# Patient Record
Sex: Female | Born: 1980
Health system: Southern US, Community
[De-identification: ages and names within clinical notes are randomized; demographics above are authoritative.]

## PROBLEM LIST (undated history)

## (undated) DIAGNOSIS — Q789 Osteochondrodysplasia, unspecified: Secondary | ICD-10-CM

## (undated) DIAGNOSIS — R87619 Unspecified abnormal cytological findings in specimens from cervix uteri: Secondary | ICD-10-CM

## (undated) DIAGNOSIS — N390 Urinary tract infection, site not specified: Secondary | ICD-10-CM

## (undated) DIAGNOSIS — N200 Calculus of kidney: Secondary | ICD-10-CM

## (undated) DIAGNOSIS — I341 Nonrheumatic mitral (valve) prolapse: Secondary | ICD-10-CM

## (undated) DIAGNOSIS — K219 Gastro-esophageal reflux disease without esophagitis: Secondary | ICD-10-CM

## (undated) DIAGNOSIS — F32A Depression, unspecified: Secondary | ICD-10-CM

## (undated) DIAGNOSIS — A4902 Methicillin resistant Staphylococcus aureus infection, unspecified site: Secondary | ICD-10-CM

## (undated) DIAGNOSIS — R011 Cardiac murmur, unspecified: Secondary | ICD-10-CM

## (undated) DIAGNOSIS — B029 Zoster without complications: Secondary | ICD-10-CM

## (undated) DIAGNOSIS — F329 Major depressive disorder, single episode, unspecified: Secondary | ICD-10-CM

## (undated) DIAGNOSIS — K589 Irritable bowel syndrome without diarrhea: Secondary | ICD-10-CM

## (undated) HISTORY — DX: Zoster without complications: B02.9

## (undated) HISTORY — DX: Urinary tract infection, site not specified: N39.0

## (undated) HISTORY — DX: Unspecified abnormal cytological findings in specimens from cervix uteri: R87.619

## (undated) HISTORY — DX: Osteochondrodysplasia, unspecified: Q78.9

## (undated) HISTORY — PX: COLPOSCOPY: SHX161

---
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
## 2002-12-05 ENCOUNTER — Ambulatory Visit (HOSPITAL_COMMUNITY): Admission: RE | Admit: 2002-12-05 | Discharge: 2002-12-05 | Payer: Self-pay | Admitting: Family Medicine

## 2002-12-05 ENCOUNTER — Encounter: Admission: RE | Admit: 2002-12-05 | Discharge: 2002-12-05 | Payer: Self-pay | Admitting: *Deleted

## 2004-11-28 ENCOUNTER — Other Ambulatory Visit: Admission: RE | Admit: 2004-11-28 | Discharge: 2004-11-28 | Payer: Self-pay | Admitting: Family Medicine

## 2005-10-15 ENCOUNTER — Inpatient Hospital Stay (HOSPITAL_COMMUNITY): Admission: AD | Admit: 2005-10-15 | Discharge: 2005-10-18 | Payer: Self-pay | Admitting: Obstetrics and Gynecology

## 2005-10-19 ENCOUNTER — Encounter: Admission: RE | Admit: 2005-10-19 | Discharge: 2005-11-16 | Payer: Self-pay | Admitting: Obstetrics and Gynecology

## 2005-11-29 ENCOUNTER — Emergency Department (HOSPITAL_COMMUNITY): Admission: EM | Admit: 2005-11-29 | Discharge: 2005-11-29 | Payer: Self-pay | Admitting: Emergency Medicine

## 2006-08-12 ENCOUNTER — Encounter: Admission: RE | Admit: 2006-08-12 | Discharge: 2006-08-12 | Payer: Self-pay | Admitting: Family Medicine

## 2008-07-22 ENCOUNTER — Emergency Department (HOSPITAL_COMMUNITY): Admission: EM | Admit: 2008-07-22 | Discharge: 2008-07-22 | Payer: Self-pay | Admitting: Family Medicine

## 2009-01-27 HISTORY — PX: TUBAL LIGATION: SHX77

## 2009-02-26 ENCOUNTER — Ambulatory Visit: Payer: Self-pay | Admitting: Advanced Practice Midwife

## 2009-02-26 ENCOUNTER — Inpatient Hospital Stay (HOSPITAL_COMMUNITY): Admission: AD | Admit: 2009-02-26 | Discharge: 2009-02-26 | Payer: Self-pay | Admitting: Optometry

## 2009-02-28 ENCOUNTER — Inpatient Hospital Stay (HOSPITAL_COMMUNITY): Admission: AD | Admit: 2009-02-28 | Discharge: 2009-02-28 | Payer: Self-pay | Admitting: Obstetrics and Gynecology

## 2009-06-17 ENCOUNTER — Inpatient Hospital Stay (HOSPITAL_COMMUNITY): Admission: AD | Admit: 2009-06-17 | Discharge: 2009-06-17 | Payer: Self-pay | Admitting: Obstetrics and Gynecology

## 2009-06-17 ENCOUNTER — Ambulatory Visit: Payer: Self-pay | Admitting: Physician Assistant

## 2009-06-21 ENCOUNTER — Inpatient Hospital Stay (HOSPITAL_COMMUNITY): Admission: AD | Admit: 2009-06-21 | Discharge: 2009-06-22 | Payer: Self-pay | Admitting: Obstetrics and Gynecology

## 2009-08-27 ENCOUNTER — Inpatient Hospital Stay (HOSPITAL_COMMUNITY): Admission: AD | Admit: 2009-08-27 | Discharge: 2009-08-27 | Payer: Self-pay | Admitting: Obstetrics and Gynecology

## 2009-09-06 ENCOUNTER — Inpatient Hospital Stay (HOSPITAL_COMMUNITY): Admission: AD | Admit: 2009-09-06 | Discharge: 2009-09-06 | Payer: Self-pay | Admitting: Obstetrics and Gynecology

## 2009-09-06 DIAGNOSIS — R109 Unspecified abdominal pain: Secondary | ICD-10-CM

## 2009-09-06 DIAGNOSIS — O9989 Other specified diseases and conditions complicating pregnancy, childbirth and the puerperium: Secondary | ICD-10-CM

## 2009-09-06 DIAGNOSIS — O99891 Other specified diseases and conditions complicating pregnancy: Secondary | ICD-10-CM

## 2009-09-06 DIAGNOSIS — N200 Calculus of kidney: Secondary | ICD-10-CM

## 2009-09-26 ENCOUNTER — Encounter (INDEPENDENT_AMBULATORY_CARE_PROVIDER_SITE_OTHER): Payer: Self-pay | Admitting: Obstetrics and Gynecology

## 2009-09-26 ENCOUNTER — Inpatient Hospital Stay (HOSPITAL_COMMUNITY): Admission: RE | Admit: 2009-09-26 | Discharge: 2009-09-28 | Payer: Self-pay | Admitting: Obstetrics and Gynecology

## 2010-03-08 ENCOUNTER — Inpatient Hospital Stay (INDEPENDENT_AMBULATORY_CARE_PROVIDER_SITE_OTHER)
Admission: RE | Admit: 2010-03-08 | Discharge: 2010-03-08 | Disposition: A | Discharge status: Home / Self Care | Payer: 59 | Source: Ambulatory Visit | Attending: Emergency Medicine | Admitting: Emergency Medicine

## 2010-03-08 DIAGNOSIS — L0201 Cutaneous abscess of face: Secondary | ICD-10-CM

## 2010-03-11 LAB — CULTURE, ROUTINE-ABSCESS: Gram Stain: NONE SEEN

## 2010-04-11 LAB — CBC
HCT: 30.3 % — ABNORMAL LOW (ref 36.0–46.0)
Hemoglobin: 10.3 g/dL — ABNORMAL LOW (ref 12.0–15.0)
WBC: 10.6 10*3/uL — ABNORMAL HIGH (ref 4.0–10.5)

## 2010-04-12 LAB — URINALYSIS, ROUTINE W REFLEX MICROSCOPIC
Glucose, UA: 250 mg/dL — AB
Ketones, ur: NEGATIVE mg/dL
Nitrite: NEGATIVE
Protein, ur: NEGATIVE mg/dL

## 2010-04-12 LAB — CBC
HCT: 33.6 % — ABNORMAL LOW (ref 36.0–46.0)
HCT: 33.8 % — ABNORMAL LOW (ref 36.0–46.0)
Hemoglobin: 11.4 g/dL — ABNORMAL LOW (ref 12.0–15.0)
MCH: 28.5 pg (ref 26.0–34.0)
MCHC: 34.4 g/dL (ref 30.0–36.0)
MCV: 84.1 fL (ref 78.0–100.0)
RBC: 4.02 MIL/uL (ref 3.87–5.11)
RDW: 13.4 % (ref 11.5–15.5)

## 2010-04-12 LAB — TYPE AND SCREEN
ABO/RH(D): O POS
Antibody Screen: NEGATIVE

## 2010-04-12 LAB — ABO/RH: ABO/RH(D): O POS

## 2010-04-14 LAB — URINALYSIS, ROUTINE W REFLEX MICROSCOPIC
Bilirubin Urine: NEGATIVE
Hgb urine dipstick: NEGATIVE
Protein, ur: NEGATIVE mg/dL
Urobilinogen, UA: 0.2 mg/dL (ref 0.0–1.0)

## 2010-04-15 LAB — COMPREHENSIVE METABOLIC PANEL
ALT: 14 U/L (ref 0–35)
Alkaline Phosphatase: 58 U/L (ref 39–117)
BUN: 11 mg/dL (ref 6–23)
Chloride: 105 mEq/L (ref 96–112)
Glucose, Bld: 102 mg/dL — ABNORMAL HIGH (ref 70–99)
Potassium: 3.8 mEq/L (ref 3.5–5.1)
Total Bilirubin: 0.4 mg/dL (ref 0.3–1.2)

## 2010-04-15 LAB — URINE MICROSCOPIC-ADD ON

## 2010-04-15 LAB — URINALYSIS, ROUTINE W REFLEX MICROSCOPIC
Bilirubin Urine: NEGATIVE
Glucose, UA: NEGATIVE mg/dL
Ketones, ur: 40 mg/dL — AB
Ketones, ur: NEGATIVE mg/dL
Protein, ur: NEGATIVE mg/dL
Protein, ur: NEGATIVE mg/dL
Urobilinogen, UA: 0.2 mg/dL (ref 0.0–1.0)

## 2010-04-15 LAB — URINE CULTURE
Colony Count: >=100000
Special Requests: POSITIVE

## 2010-04-15 LAB — CBC
HCT: 31.6 % — ABNORMAL LOW (ref 36.0–46.0)
Hemoglobin: 11.1 g/dL — ABNORMAL LOW (ref 12.0–15.0)
RBC: 3.62 MIL/uL — ABNORMAL LOW (ref 3.87–5.11)
WBC: 13.5 10*3/uL — ABNORMAL HIGH (ref 4.0–10.5)

## 2010-04-17 LAB — CBC
HCT: 40.6 % (ref 36.0–46.0)
Hemoglobin: 13.8 g/dL (ref 12.0–15.0)
MCV: 85 fL (ref 78.0–100.0)
Platelets: 264 10*3/uL (ref 150–400)
WBC: 12 10*3/uL — ABNORMAL HIGH (ref 4.0–10.5)

## 2010-04-17 LAB — URINALYSIS, ROUTINE W REFLEX MICROSCOPIC
Bilirubin Urine: NEGATIVE
Ketones, ur: 15 mg/dL — AB
Nitrite: NEGATIVE
Protein, ur: NEGATIVE mg/dL

## 2010-04-17 LAB — COMPREHENSIVE METABOLIC PANEL
Alkaline Phosphatase: 56 U/L (ref 39–117)
BUN: 6 mg/dL (ref 6–23)
CO2: 26 mEq/L (ref 19–32)
Chloride: 102 mEq/L (ref 96–112)
Creatinine, Ser: 0.54 mg/dL (ref 0.4–1.2)
GFR calc non Af Amer: >60 mL/min (ref >60)
Glucose, Bld: 86 mg/dL (ref 70–99)
Potassium: 3.7 mEq/L (ref 3.5–5.1)
Total Bilirubin: 0.4 mg/dL (ref 0.3–1.2)

## 2010-04-17 LAB — T4, FREE: Free T4: 1.59 ng/dL (ref 0.80–1.80)

## 2010-04-17 LAB — TSH: TSH: 0.828 u[IU]/mL (ref 0.350–4.500)

## 2010-05-06 LAB — POCT URINALYSIS DIP (DEVICE)
Ketones, ur: NEGATIVE mg/dL
Protein, ur: NEGATIVE mg/dL
Urobilinogen, UA: 0.2 mg/dL (ref 0.0–1.0)
pH: 7 (ref 5.0–8.0)

## 2010-05-06 LAB — POCT PREGNANCY, URINE: Preg Test, Ur: NEGATIVE

## 2010-06-14 NOTE — Op Note (Signed)
Emily [name], Emily [name]                    ACCOUNT NO.:  [id number]   MEDICAL RECORD NO.:  [id number]          PATIENT TYPE:  INP   LOCATION:  9129                          FACILITY:  [facility]   PHYSICIAN:  Miguel Aschoff, M.D.       DATE OF BIRTH:  [birth year]   DATE OF PROCEDURE:  10/15/2005  DATE OF DISCHARGE:                                 OPERATIVE REPORT   PREOPERATIVE DIAGNOSES:  1. Intrauterine pregnancy at term.  2. Nonreassuring fetal heart tracing.   POSTOPERATIVE DIAGNOSES:  1. Intrauterine pregnancy at term.  2. Nonreassuring fetal heart tracing.  3. Delivery of viable female infant, Apgar 9, 9, weighing 9 pounds 3 ounces.   PROCEDURE:  Primary low flap transverse cesarean section.   SURGEON:  Dr. Miguel Aschoff   ANESTHESIA:  Epidural.   COMPLICATIONS:  None.   JUSTIFICATION:  The patient is a 30 year old white female gravida 1, para 0  at term who presented with spontaneous onset of labor.  The patient  progressed to complete dilatation and remained at -2 station.  She pushed  for 1-1/2 hours and during these pushing efforts, she developed persistent  and repetitive variable decelerations.  With continued pushing,  decelerations became more marked consistent with tachycardia with no further  descent of fetal presenting part, it was elected to proceed with primary  cesarean section for nonreassuring fetal heart tracing.   PROCEDURE:  The patient was taken to the operating room, placed in the  supine position, deviated to left and prepped and draped in the usual  sterile fashion.  A Pfannenstiel incision was made, extended down through  subcutaneous tissue with bleeding points being clamped and coagulated as  they were encountered.  The fascia was then identified and incised  transversely and then separated from the underlying rectus muscles.  Rectus  muscles were divided in midline.  Peritoneum was then found and entered  carefully avoiding underlying structures.  Peritoneal  incision was then  extended under direct visualization.  Bladder flap was then created and  protected with the bladder blade.  An elliptical transverse incision was  made into lower uterine segment.  Amniotic cavity was entered.  Clear fluid  was obtained and, at this point, the patient was delivered of a viable female  infant, Apgar 9 at one minute and 9 at five minutes from a vertex ROA  position.  The baby weighed 9 pounds 3 ounces.  Baby was handed to the  pediatric team in attendance.  Cord bloods were then obtained for  appropriate studies.  The placenta was then delivered.  At this point, the  uterus was evacuated of any remaining products of conception.  The angles of  the uterine incision were then ligated using figure-of-eight sutures of #1  Vicryl.  Then the uterus was closed in layers; the first layer was running  interlocking suture of #1 Vicryl followed by an imbricating suture of #1  Vicryl.  The bladder flap was then reapproximated using running continuous 2-  0 Vicryl suture.  At this point, inspection made for hemostasis.  Hemostasis  appeared to be excellent, and the abdomen was closed.  The parietal  peritoneum was closed using running continuous 0 Vicryl suture.  Rectus  muscles were reapproximated using running continuous 0 Vicryl suture.  Fascia was then closed using 2 sutures of 0 Vicryl, each starting at the  lateral fascial angles and meeting in  the midline.  Subcutaneous tissue was closed using interrupted 0 Vicryl  suture, and the skin incision was closed using staples.  The estimated blood  loss was approximately 600 mL.  The patient tolerated the procedure well and  went to the recovery room in satisfactory condition.      Miguel Aschoff, M.D.  Electronically Signed     AR/MEDQ  D:  10/15/2005  T:  10/17/2005  Job:  [id number]

## 2010-06-14 NOTE — Discharge Summary (Signed)
Emily [name], Emily [name]                    ACCOUNT NO.:  [id number]   MEDICAL RECORD NO.:  [id number]          PATIENT TYPE:  INP   LOCATION:  9129                          FACILITY:  [facility]   PHYSICIAN:  Randye Lobo, M.D.   DATE OF BIRTH:  Feb 16, 1980   DATE OF ADMISSION:  10/15/2005  DATE OF DISCHARGE:  10/18/2005                                 DISCHARGE SUMMARY   FINAL DIAGNOSES:  1. Intrauterine pregnancy at term.  2. Active labor.  3. Nonreassuring fetal heart tracing.   PROCEDURE:  Primary low flap transverse cesarean section.   SURGEON:  Dr. Miguel Aschoff.   COMPLICATIONS:  None.   This 30 year old G1, P0 presents at term with spontaneous onset of labor.  The patient's antepartum course up to this point had been uncomplicated.  She had a negative group B strep culture obtained in our office at 35 weeks  and the patient is also noted to have mitral valve prolapse but no  complications from that.  She is admitted at this time.  She dilated to  complete and complete and was at -2 station.  She pushed for over an hour  and a half.  During the pushing effort, started to develop some persistent  and repetitive variable deceleration and the fetal presenting part was not  descending any further.  Because of this, a discussion was held with the  patient and a decision was made to proceed with a cesarean section.  The  patient was taken to the operating room on October 15, 2005 by Dr. Miguel Aschoff where a primary low flap transverse cesarean section was performed with  the delivery of a 9-pound 3-ounce female infant of  Apgars of 9 and 9.  Delivery went without complications.  The patient's postoperative course was  complicated by some postoperative anemia.  The patient remained afebrile.  She was started on some iron during her postoperative course and was felt  ready for discharge on postoperative day three.  She was sent home on a  regular diet.  Told to decrease activities.  Told to  continue her prenatal  vitamins and an iron supplement 3 times daily.  Was told she could use some  Percocet 1-2 every 4 hours as needed for pain but also she could use  ibuprofen up to 600 mg every 6 hours as needed for pain and was to follow up  in our office in 4 weeks.  Instructions and precautions were reviewed with  the patient.   LABS ON DISCHARGE:  The patient had a hemoglobin of 8.0 which was down from  a preoperative level of 11.1.  She also had a white blood cell count of  12.0, and platelets of 182,000.      Leilani Able, P.A.-C.      Randye Lobo, M.D.  Electronically Signed    MB/MEDQ  D:  11/21/2005  T:  11/23/2005  Job:  [id number]

## 2011-03-24 ENCOUNTER — Encounter: Payer: Self-pay | Admitting: Gastroenterology

## 2011-03-25 ENCOUNTER — Encounter (HOSPITAL_COMMUNITY): Payer: Self-pay | Admitting: *Deleted

## 2011-03-25 ENCOUNTER — Ambulatory Visit (HOSPITAL_COMMUNITY)
Admission: RE | Admit: 2011-03-25 | Discharge: 2011-03-25 | Disposition: A | Discharge status: Home / Self Care | Payer: 59 | Source: Ambulatory Visit | Attending: Gastroenterology | Admitting: Gastroenterology

## 2011-03-25 ENCOUNTER — Encounter (HOSPITAL_COMMUNITY)
Admission: RE | Disposition: A | Discharge status: Home / Self Care | Payer: Self-pay | Source: Ambulatory Visit | Attending: Gastroenterology

## 2011-03-25 DIAGNOSIS — R1310 Dysphagia, unspecified: Secondary | ICD-10-CM | POA: Insufficient documentation

## 2011-03-25 HISTORY — DX: Depression, unspecified: F32.A

## 2011-03-25 HISTORY — DX: Cardiac murmur, unspecified: R01.1

## 2011-03-25 HISTORY — PX: SAVORY DILATION: SHX5439

## 2011-03-25 HISTORY — PX: ESOPHAGOGASTRODUODENOSCOPY: SHX5428

## 2011-03-25 HISTORY — DX: Nonrheumatic mitral (valve) prolapse: I34.1

## 2011-03-25 HISTORY — DX: Calculus of kidney: N20.0

## 2011-03-25 HISTORY — DX: Gastro-esophageal reflux disease without esophagitis: K21.9

## 2011-03-25 HISTORY — DX: Methicillin resistant Staphylococcus aureus infection, unspecified site: A49.02

## 2011-03-25 HISTORY — DX: Major depressive disorder, single episode, unspecified: F32.9

## 2011-03-25 HISTORY — DX: Irritable bowel syndrome, unspecified: K58.9

## 2011-03-25 HISTORY — DX: Urinary tract infection, site not specified: N39.0

## 2011-03-25 SURGERY — EGD (ESOPHAGOGASTRODUODENOSCOPY)
Anesthesia: Moderate Sedation

## 2011-03-25 MED ORDER — FENTANYL CITRATE 0.05 MG/ML IJ SOLN
INTRAMUSCULAR | Status: AC
  Filled 2011-03-25: qty 4

## 2011-03-25 MED ORDER — DIPHENHYDRAMINE HCL 50 MG/ML IJ SOLN
INTRAMUSCULAR | Status: AC
  Filled 2011-03-25: qty 1

## 2011-03-25 MED ORDER — MIDAZOLAM HCL 10 MG/2ML IJ SOLN
INTRAMUSCULAR | Status: AC
  Filled 2011-03-25: qty 4

## 2011-03-25 MED ORDER — FENTANYL NICU IV SYRINGE 50 MCG/ML
INJECTION | INTRAMUSCULAR | Status: DC | PRN
  Administered 2011-03-25 (×3): 25 ug via INTRAVENOUS

## 2011-03-25 MED ORDER — BUTAMBEN-TETRACAINE-BENZOCAINE 2-2-14 % EX AERO
INHALATION_SPRAY | CUTANEOUS | Status: DC | PRN
  Administered 2011-03-25: 2 via TOPICAL

## 2011-03-25 MED ORDER — MIDAZOLAM HCL 10 MG/2ML IJ SOLN
INTRAMUSCULAR | Status: DC | PRN
  Administered 2011-03-25: 2 mg via INTRAVENOUS
  Administered 2011-03-25 (×2): 2.5 mg via INTRAVENOUS

## 2011-03-25 MED ORDER — SODIUM CHLORIDE 0.9 % IV SOLN
Freq: Once | INTRAVENOUS | Status: AC
  Administered 2011-03-25: 10:00:00 via INTRAVENOUS

## 2011-03-25 NOTE — Op Note (Signed)
[facility] [address] [place], [place]  [address]  ENDOSCOPY PROCEDURE REPORT  PATIENT:  Emily [name], Emily [name]  MR#:  [id number] BIRTHDATE:  Jul 19, 1980, 30 yrs. old  GENDER:  female  ENDOSCOPIST:  Dorena Cookey Referred by:  PROCEDURE DATE:  03/25/2011 PROCEDURE: ASA CLASS: INDICATIONS:  MEDICATIONS: 75 mcg fentanyl  7 mg Versed TOPICAL ANESTHETIC: Cetacaine spray  DESCRIPTION OF PROCEDURE:   After the risks benefits and alternatives of the procedure were thoroughly explained, informed consent was obtained.  The Pentax Gastroscope [id number] endoscope was introduced through the mouth and advanced to the , without limitations.  The instrument was slowly withdrawn as the mucosa was fully examined. <<PROCEDUREIMAGES>>  FINDINGS:  Basically normal endoscopy, no ring stricture or other abnormality. No specific appearance suggestive of eosinophilic esophagitis but biopsies were obtained to rule this out.  COMPLICATIONS:  None.  ENDOSCOPIC IMPRESSION:  Normal endoscopy without endoscopically visible causes for dysphagia.  RECOMMENDATIONS: Await biopsy results. Further recommendations to follow  REPEAT EXAM:  ______________________________ Dorena Cookey  CC:  n. eSIGNED:   Dorena Cookey at 03/25/2011 10:25 AM  Lamont Dowdy, [phone]

## 2011-03-25 NOTE — Brief Op Note (Signed)
03/25/2011  10:27 AM  PATIENT:  Emily [name]  31 y.o. female  PRE-OPERATIVE DIAGNOSIS:  dys  POST-OPERATIVE DIAGNOSIS:  * No post-op diagnosis entered *  PROCEDURE:  Procedure(s) (LRB): ESOPHAGOGASTRODUODENOSCOPY (EGD) (N/A) SAVORY DILATION (N/A)  SURGEON:  Surgeon(s) and Role:    * Barrie Folk, MD - Primary  See formal operative note. No specific abnormalities found. Biopsies were taken to rule out eosinophilic esophagitis.

## 2011-03-26 ENCOUNTER — Encounter (HOSPITAL_COMMUNITY): Payer: Self-pay | Admitting: Gastroenterology

## 2011-04-01 ENCOUNTER — Ambulatory Visit: Payer: 59 | Admitting: Gastroenterology

## 2011-09-13 ENCOUNTER — Ambulatory Visit (INDEPENDENT_AMBULATORY_CARE_PROVIDER_SITE_OTHER): Payer: Managed Care, Other (non HMO) | Admitting: Internal Medicine

## 2011-09-13 ENCOUNTER — Ambulatory Visit: Payer: Managed Care, Other (non HMO)

## 2011-09-13 VITALS — BP 96/67 | HR 86 | Temp 98.0°F | Resp 16 | Ht 64.5 in | Wt 131.2 lb

## 2011-09-13 DIAGNOSIS — R51 Headache: Secondary | ICD-10-CM

## 2011-09-13 DIAGNOSIS — L03211 Cellulitis of face: Secondary | ICD-10-CM

## 2011-09-13 DIAGNOSIS — R519 Headache, unspecified: Secondary | ICD-10-CM

## 2011-09-13 DIAGNOSIS — L0201 Cutaneous abscess of face: Secondary | ICD-10-CM

## 2011-09-13 LAB — POCT CBC
Lymph, poc: 1.7 (ref 0.6–3.4)
MCHC: 30.3 g/dL — AB (ref 31.8–35.4)
MID (cbc): 0.4 (ref 0–0.9)
MPV: 8.7 fL (ref 0–99.8)
POC Granulocyte: 1.7 — AB (ref 2–6.9)
POC LYMPH PERCENT: 44.4 %L (ref 10–50)
POC MID %: 11.1 %M (ref 0–12)
RDW, POC: 13.3 %

## 2011-09-13 MED ORDER — CEFTRIAXONE SODIUM 1 G IJ SOLR
1.0000 g | Freq: Once | INTRAMUSCULAR | Status: AC
  Administered 2011-09-13: 1 g via INTRAMUSCULAR

## 2011-09-13 NOTE — Progress Notes (Signed)
  Subjective:    Patient ID: Emily [name], female    DOB: 07-Apr-1980, 31 y.o.   MRN: [id number]  HPI Hx of 4 episodes of MRSA infection. Now has abscess on left cheek and getting bigger after 4 days of doxycycline. Painfull and swollen. Takes keflex without allergic reaction, will give rocephin im  Review of Systems Goes to other urgent cares    Objective:   Physical Exam  Vitals reviewed. Constitutional: She is oriented to person, place, and time. She appears well-developed and well-nourished. She appears distressed.  HENT:  Head: Head is with left periorbital erythema.    Right Ear: External ear normal.  Left Ear: External ear normal.  Nose: Nose normal.  Mouth/Throat: Oropharynx is clear and moist.       Abscess face Very painfull  Eyes: EOM are normal.  Neck: Normal range of motion. Neck supple.  Pulmonary/Chest: Effort normal.  Genitourinary: Vagina normal.  Neurological: She is alert and oriented to person, place, and time. Coordination normal.  Skin: Skin is warm. Rash noted. There is erythema.  Psychiatric: She has a normal mood and affect.   cbc Results for orders placed in visit on 09/13/11  POCT CBC      Component Value Range   WBC 3.9 (*) 4.6 - 10.2 K/uL   Lymph, poc 1.7  0.6 - 3.4   POC LYMPH PERCENT 44.4  10 - 50 %L   MID (cbc) 0.4  0 - 0.9   POC MID % 11.1  0 - 12 %M   POC Granulocyte 1.7 (*) 2 - 6.9   Granulocyte percent 44.5  37 - 80 %G   RBC 4.74  4.04 - 5.48 M/uL   Hemoglobin 12.5  12.2 - 16.2 g/dL   HCT, POC 40.9  81.1 - 47.9 %   MCV 86.9  80 - 97 fL   MCH, POC 26.4 (*) 27 - 31.2 pg   MCHC 30.3 (*) 31.8 - 35.4 g/dL   RDW, POC 91.4     Platelet Count, POC 237  142 - 424 K/uL   MPV 8.7  0 - 99.8 fL   [facility] reading (PRIMARY) by  Dr.Guest nad.       Assessment & Plan:  Abscess/cellulitis face probably improving Rocephin 1g im Consider clindamycin

## 2011-09-13 NOTE — Patient Instructions (Signed)

## 2012-02-15 ENCOUNTER — Telehealth: Payer: Self-pay

## 2012-02-15 ENCOUNTER — Ambulatory Visit (INDEPENDENT_AMBULATORY_CARE_PROVIDER_SITE_OTHER): Payer: Managed Care, Other (non HMO) | Admitting: Emergency Medicine

## 2012-02-15 VITALS — BP 103/68 | HR 90 | Temp 98.0°F | Resp 16 | Ht 65.0 in | Wt 129.0 lb

## 2012-02-15 DIAGNOSIS — L299 Pruritus, unspecified: Secondary | ICD-10-CM

## 2012-02-15 DIAGNOSIS — T7840XA Allergy, unspecified, initial encounter: Secondary | ICD-10-CM

## 2012-02-15 DIAGNOSIS — N390 Urinary tract infection, site not specified: Secondary | ICD-10-CM

## 2012-02-15 DIAGNOSIS — R21 Rash and other nonspecific skin eruption: Secondary | ICD-10-CM

## 2012-02-15 LAB — POCT URINALYSIS DIPSTICK
Leukocytes, UA: NEGATIVE
Nitrite, UA: NEGATIVE
Protein, UA: NEGATIVE
pH, UA: 5.5

## 2012-02-15 LAB — POCT UA - MICROSCOPIC ONLY
Casts, Ur, LPF, POC: NEGATIVE
Crystals, Ur, HPF, POC: NEGATIVE
Yeast, UA: NEGATIVE

## 2012-02-15 MED ORDER — EPINEPHRINE 0.3 MG/0.3ML IJ DEVI: 0.3000 mg | Freq: Once | INTRAMUSCULAR | Status: DC

## 2012-02-15 NOTE — Patient Instructions (Addendum)
You can take Zyrtec 10 mg one a day and have already had one dose today. You can take Zantac 150 twice a day and you have had one dose today. You can take Benadryl 25 mg one to 2 every 6 hours as needed. You can also use Aveeno bath. You will have an EpiPen for emergency situations.Allergic Reaction, Mild to Moderate Allergies may happen from anything your body is sensitive to. This may be food, medications, pollens, chemicals, and nearly anything around you in everyday life that produces allergens. An allergen is anything that causes an allergy producing substance. Allergens cause your body to release allergic antibodies. Through a chain of events, they cause a release of histamine into the blood stream. Histamines are meant to protect you, but they also cause your discomfort. This is why antihistamines are often used for allergies. Heredity is often a factor in causing allergic reactions. This means you may have some of the same allergies as your parents. Allergies happen in all age groups. You may have some idea of what caused your reaction. There are many allergens around [place]. It may be difficult to know what caused your reaction. If this is a first time event, it may never happen again. Allergies cannot be cured but can be controlled with medications. SYMPTOMS  You may get some or all of the following problems from allergies.  Swelling and itching in and around the mouth.   Tearing, itchy eyes.   Nasal congestion and runny nose.   Sneezing and coughing.   An itchy red rash or hives.   Vomiting or diarrhea.   Difficulty breathing.  Seasonal allergies occur in all age groups. They are seasonal because they usually occur during the same season every year. They may be a reaction to molds, grass pollens, or tree pollens. Other causes of allergies are house dust mite allergens, pet dander and mold spores. These are just a common few of the thousands of allergens around [place]. All of the symptoms listed  above happen when you come in contact with pollens and other allergens. Seasonal allergies are usually not life threatening. They are generally more of a nuisance that can often be handled using medications. Hay fever is a combination of all or some of the above listed allergy problems. It may often be treated with simple over-the-counter medications such as diphenhydramine. Take medication as directed. Check with your caregiver or package insert for child dosages. TREATMENT AND HOME CARE INSTRUCTIONS If hives or rash are present:  Take medications as directed.   You may use an over-the-counter antihistamine (diphenhydramine) for hives and itching as needed. Do not drive or drink alcohol until medications used to treat the reaction have worn off. Antihistamines tend to make people sleepy.   Apply cold cloths (compresses) to the skin or take baths in cool water. This will help itching. Avoid hot baths or showers. Heat will make a rash and itching worse.   If your allergies persist and become more severe, and over the counter medications are not effective, there are many new medications your caretaker can prescribe. Immunotherapy or desensitizing injections can be used if all else fails. Follow up with your caregiver if problems continue.  SEEK MEDICAL CARE IF:   Your allergies are becoming progressively more troublesome.   You suspect a food allergy. Symptoms generally happen within 30 minutes of eating a food.   Your symptoms have not gone away within 2 days or are getting worse.   You develop new  symptoms.   You want to retest yourself or your child with a food or drink you think causes an allergic reaction. Never test yourself or your child of a suspected allergy without being under the watchful eye of your caregivers. A second exposure to an allergen may be life-threatening.  SEEK IMMEDIATE MEDICAL CARE IF:  You develop difficulty breathing or wheezing, or have a tight feeling in your  chest or throat.   You develop a swollen mouth, hives, swelling, or itching all over your body.  A severe reaction with any of the above problems should be considered life-threatening. If you suddenly develop difficulty breathing call for local emergency medical help. THIS IS AN EMERGENCY. MAKE SURE YOU:   Understand these instructions.   Will watch your condition.   Will get help right away if you are not doing well or get worse.  Document Released: 11/10/2006 Document Revised: 01/02/2011 Document Reviewed: 11/10/2006 [facility] Patient Information 2012 [place], [place].

## 2012-02-15 NOTE — Progress Notes (Signed)
  Subjective:    Patient ID: Emily [name], female    DOB: Jul 01, 1980, 32 y.o.   MRN: [id number]  HPI Patient started taking an antibiotic for urinary symptoms and began itching on Saturday. She then began to have a rash. Denies throat pain/swelling or having a difficult time breathing. Patient is allergic to penicillin sulfa. She recently was treated with Macrodantin. This did not heal her urinary tract and she was changed to Cipro. He'll does Friday 2 doses Saturday no dose this AM.    Review of Systems     Objective:   Physical Exam H. EENT exam is unremarkable. Examination of the throat reveals no swelling of the uvula. There is no stridor noted her lungs are clear. There are hives present on the arms inside of both legs and on the back  Results for orders placed in visit on 02/15/12  POCT URINALYSIS DIPSTICK      Component Value Range   Color, UA yellow     Clarity, UA cloudy     Glucose, UA neg     Bilirubin, UA neg     Ketones, UA trace     Spec Grav, UA >=1.030     Blood, UA mod     pH, UA 5.5     Protein, UA neg     Urobilinogen, UA 0.2     Nitrite, UA neg     Leukocytes, UA Negative    POCT UA - MICROSCOPIC ONLY      Component Value Range   WBC, Ur, HPF, POC 5-10     RBC, urine, microscopic 3-6     Bacteria, U Microscopic small     Mucus, UA small     Epithelial cells, urine per micros 5-10     Crystals, Ur, HPF, POC neg     Casts, Ur, LPF, POC neg     Yeast, UA neg          Assessment & Plan:  Patient have an acute allergic reaction most likely secondary to Cipro. We'll need to stop the Cipro she will be treated with antihistamines Aveeno bath. I will also give her an EpiPen for emergency situations. Her urine is mildly abnormal but will hold off on treatment at this for now. Her urine culture was done. She is to contact her doctor tomorrow .

## 2012-02-15 NOTE — Telephone Encounter (Signed)
WAS HERE EARLIER TODAY FOR ALLERGIC REACTION; HAD 3 RX GOING TO [facility] BUT THEY HAVE NOT BEEN RECEIVED YET; WANTS TO KNOW IF THEY CAN BE SENT TO [facility]

## 2012-02-16 ENCOUNTER — Telehealth: Payer: Self-pay

## 2012-02-16 MED ORDER — RANITIDINE HCL 150 MG PO TABS: 150.0000 mg | ORAL_TABLET | Freq: Two times a day (BID) | ORAL | Status: DC

## 2012-02-16 MED ORDER — CETIRIZINE HCL 10 MG PO TABS: 10.0000 mg | ORAL_TABLET | Freq: Every day | ORAL | Status: DC

## 2012-02-16 NOTE — Telephone Encounter (Signed)
[facility] - fax ov [phone]   Needs the notes from saturdays OV   [phone] (H)

## 2012-02-16 NOTE — Telephone Encounter (Signed)
She was to have Zantac and Zyrtec these are sent for her.

## 2012-02-16 NOTE — Telephone Encounter (Signed)
faxed

## 2012-04-21 ENCOUNTER — Telehealth: Payer: Self-pay | Admitting: Gynecology

## 2012-04-21 ENCOUNTER — Encounter: Payer: Self-pay | Admitting: Obstetrics & Gynecology

## 2012-04-21 NOTE — Telephone Encounter (Signed)
PT. DID NOT WISH TO LEAVE A MSG

## 2012-04-23 ENCOUNTER — Encounter: Payer: Managed Care, Other (non HMO) | Admitting: Gynecology

## 2012-06-20 ENCOUNTER — Ambulatory Visit (INDEPENDENT_AMBULATORY_CARE_PROVIDER_SITE_OTHER): Payer: Managed Care, Other (non HMO) | Admitting: Physician Assistant

## 2012-06-20 VITALS — BP 114/78 | HR 74 | Temp 98.0°F | Resp 16 | Ht 64.0 in | Wt 129.6 lb

## 2012-06-20 DIAGNOSIS — R82998 Other abnormal findings in urine: Secondary | ICD-10-CM

## 2012-06-20 DIAGNOSIS — A599 Trichomoniasis, unspecified: Secondary | ICD-10-CM

## 2012-06-20 DIAGNOSIS — N390 Urinary tract infection, site not specified: Secondary | ICD-10-CM

## 2012-06-20 DIAGNOSIS — N898 Other specified noninflammatory disorders of vagina: Secondary | ICD-10-CM

## 2012-06-20 DIAGNOSIS — Z7251 High risk heterosexual behavior: Secondary | ICD-10-CM

## 2012-06-20 DIAGNOSIS — R8290 Unspecified abnormal findings in urine: Secondary | ICD-10-CM

## 2012-06-20 LAB — POCT UA - MICROSCOPIC ONLY: Yeast, UA: NEGATIVE

## 2012-06-20 LAB — POCT URINALYSIS DIPSTICK
Bilirubin, UA: NEGATIVE
Glucose, UA: NEGATIVE
Nitrite, UA: NEGATIVE
Urobilinogen, UA: 0.2

## 2012-06-20 LAB — POCT WET PREP WITH KOH
KOH Prep POC: NEGATIVE
Trichomonas, UA: POSITIVE

## 2012-06-20 LAB — HEPATITIS C ANTIBODY: HCV Ab: NEGATIVE

## 2012-06-20 MED ORDER — METRONIDAZOLE 500 MG PO TABS: 2000.0000 mg | ORAL_TABLET | Freq: Once | ORAL | Status: DC

## 2012-06-20 MED ORDER — METRONIDAZOLE 500 MG PO TABS: 2000.0000 mg | ORAL_TABLET | Freq: Once | ORAL | Status: AC

## 2012-06-20 NOTE — Patient Instructions (Addendum)
Trichomoniasis °Trichomoniasis is an infection, caused by the Trichomonas organism, that affects both women and men. In women, the outer female genitalia and the vagina are affected. In men, the penis is mainly affected, but the prostate and other reproductive organs can also be involved. Trichomoniasis is a sexually transmitted disease (STD) and is most often passed to another person through sexual contact. The majority of people who get trichomoniasis do so from a sexual encounter and are also at risk for other STDs. °CAUSES  °· Sexual intercourse with an infected partner. °· It can be present in swimming pools or hot tubs. °SYMPTOMS  °· Abnormal gray-green frothy vaginal discharge in women. °· Vaginal itching and irritation in women. °· Itching and irritation of the area outside the vagina in women. °· Penile discharge with or without pain in males. °· Inflammation of the urethra (urethritis), causing painful urination. °· Bleeding after sexual intercourse. °RELATED COMPLICATIONS °· Pelvic inflammatory disease. °· Infection of the uterus (endometritis). °· Infertility. °· Tubal (ectopic) pregnancy. °· It can be associated with other STDs, including gonorrhea and chlamydia, hepatitis B, and HIV. °COMPLICATIONS DURING PREGNANCY °· Early (premature) delivery. °· Premature rupture of the membranes (PROM). °· Low birth weight. °DIAGNOSIS  °· Visualization of Trichomonas under the microscope from the vagina discharge. °· Ph of the vagina greater than 4.5, tested with a test tape. °· Trich Rapid Test. °· Culture of the organism, but this is not usually needed. °· It may be found on a Pap test. °· Having a "strawberry cervix,"which means the cervix looks very red like a strawberry. °TREATMENT  °· You may be given medication to fight the infection. Inform your caregiver if you could be or are pregnant. Some medications used to treat the infection should not be taken during pregnancy. °· Over-the-counter medications or  creams to decrease itching or irritation may be recommended. °· Your sexual partner will need to be treated if infected. °HOME CARE INSTRUCTIONS  °· Take all medication prescribed by your caregiver. °· Take over-the-counter medication for itching or irritation as directed by your caregiver. °· Do not have sexual intercourse while you have the infection. °· Do not douche or wear tampons. °· Discuss your infection with your partner, as your partner may have acquired the infection from you. Or, your partner may have been the person who transmitted the infection to you. °· Have your sex partner examined and treated if necessary. °· Practice safe, informed, and protected sex. °· See your caregiver for other STD testing. °SEEK MEDICAL CARE IF:  °· You still have symptoms after you finish the medication. °· You have an oral temperature above 102° F (38.9° C). °· You develop belly (abdominal) pain. °· You have pain when you urinate. °· You have bleeding after sexual intercourse. °· You develop a rash. °· The medication makes you sick or makes you throw up (vomit). °Document Released: 07/09/2000 Document Revised: 04/07/2011 Document Reviewed: 08/04/2008 °[employer]® Patient Information ©2014 [employer]. ° °

## 2012-06-20 NOTE — Progress Notes (Signed)
[address], [place] [address]   Phone [phone]  Subjective:    Patient ID: Emily [name], female    DOB: 05-19-1980, 32 y.o.   MRN: [id number]  HPI Pt presents to clinic with strong urine odor this am and 2 bumps on her R labia that she is worried about.  6 months ago she found out that her partner had cheated on her but she never got tested.  She is now with a new female partner and this noticed she had increase vaginal d/c.  She looked at her genitalia with a mirror and saw 2 bumps and she is really scared she has herpes.  She has an IUD and spots time to time when she wipes.  Her current partner has no symptoms.  She had a pap smear within a year >6 months ago which was normal.   Review of Systems  Constitutional: Negative for fever and chills.  Genitourinary: Positive for vaginal bleeding (from IUD and menses) and vaginal discharge (odor). Negative for dysuria, frequency and hematuria.       Objective:   Physical Exam  Vitals reviewed. Constitutional: She is oriented to person, place, and time. She appears well-developed and well-nourished.  HENT:  Head: Normocephalic and atraumatic.  Right Ear: External ear normal.  Left Ear: External ear normal.  Eyes: Conjunctivae are normal.  Neck: Normal range of motion.  Cardiovascular: Normal rate, regular rhythm and normal heart sounds.   No murmur heard. Pulmonary/Chest: Effort normal and breath sounds normal.  Abdominal: Soft. Bowel sounds are normal. There is no CVA tenderness.  Genitourinary: Pelvic exam was performed with patient supine. No labial fusion. There is rash (Inside of her labia minor patient has a hyperpigmented flat area, she also anterior to that has a blood blister that is raised.  There is no ulceration and no signs of HSV lesion) on the right labia. There is no tenderness, lesion or injury on the right labia. There is no rash, tenderness, lesion or injury on the left labia. No erythema, tenderness or bleeding  around the vagina. No foreign body around the vagina. No signs of injury around the vagina. Vaginal discharge (frothy d/c) found.  IUD string in place   Neurological: She is alert and oriented to person, place, and time.  Skin: Skin is warm and dry.  Psychiatric: She has a normal mood and affect. Her behavior is normal. Judgment and thought content normal.   Results for orders placed in visit on 06/20/12  POCT WET PREP WITH KOH      Result Value Range   Trichomonas, UA Positive     Clue Cells Wet Prep HPF POC 0-1     Epithelial Wet Prep HPF POC 0-4     Yeast Wet Prep HPF POC neg     Bacteria Wet Prep HPF POC 3+     RBC Wet Prep HPF POC tntc     WBC Wet Prep HPF POC 10-15     KOH Prep POC Negative    POCT URINALYSIS DIPSTICK      Result Value Range   Color, UA yellow     Clarity, UA clear     Glucose, UA neg     Bilirubin, UA neg     Ketones, UA neg     Spec Grav, UA 1.020     Blood, UA moderate     pH, UA 7.5     Protein, UA neg     Urobilinogen, UA 0.2  Nitrite, UA neg     Leukocytes, UA large (3+)    POCT UA - MICROSCOPIC ONLY      Result Value Range   WBC, Ur, HPF, POC 15-tntc     RBC, urine, microscopic 0-2     Bacteria, U Microscopic 1+     Mucus, UA moderate     Epithelial cells, urine per micros 10-tntc     Crystals, Ur, HPF, POC neg     Casts, Ur, LPF, POC neg     Yeast, UA neg            Assessment & Plan:  Vaginal discharge - Plan: POCT Wet Prep with KOH, GC/Chlamydia Probe Amp  High risk sexual behavior - due to past partner cheating on her- Plan: HSV 2 antibody, IgG, RPR, HIV antibody, Hepatitis C antibody, Hepatitis B surface antigen, Hepatitis B surface antibody  Abnormal urine odor - Plan: POCT urinalysis dipstick, POCT UA - Microscopic Only  UTI (urinary tract infection) - send for culture - this appears to be a dirty catch so we will wait for results before abx are started - Plan: Urine culture  Trichimoniasis -current partner needs  treatment- Plan: metroNIDAZOLE (FLAGYL) 500 MG tablet, DISCONTINUED: metroNIDAZOLE (FLAGYL) 500 MG tablet  Benny Lennert PA-C 06/20/2012 11:04 AM

## 2012-06-21 LAB — URINE CULTURE
Colony Count: NO GROWTH
Organism ID, Bacteria: NO GROWTH

## 2012-06-22 LAB — GC/CHLAMYDIA PROBE AMP
CT Probe RNA: NEGATIVE
GC Probe RNA: NEGATIVE

## 2012-06-22 LAB — HSV 2 ANTIBODY, IGG: HSV 2 Glycoprotein G Ab, IgG: 0.14 IV

## 2012-11-16 ENCOUNTER — Encounter: Payer: Self-pay | Admitting: Gynecology

## 2012-11-17 ENCOUNTER — Ambulatory Visit (INDEPENDENT_AMBULATORY_CARE_PROVIDER_SITE_OTHER): Payer: Managed Care, Other (non HMO) | Admitting: Gynecology

## 2012-11-17 ENCOUNTER — Encounter: Payer: Self-pay | Admitting: Gynecology

## 2012-11-17 VITALS — BP 102/60 | HR 68 | Resp 12 | Ht 64.5 in | Wt 140.0 lb

## 2012-11-17 DIAGNOSIS — B001 Herpesviral vesicular dermatitis: Secondary | ICD-10-CM

## 2012-11-17 DIAGNOSIS — Z0000 Encounter for general adult medical examination without abnormal findings: Secondary | ICD-10-CM

## 2012-11-17 DIAGNOSIS — Z01419 Encounter for gynecological examination (general) (routine) without abnormal findings: Secondary | ICD-10-CM

## 2012-11-17 DIAGNOSIS — Z124 Encounter for screening for malignant neoplasm of cervix: Secondary | ICD-10-CM

## 2012-11-17 DIAGNOSIS — B009 Herpesviral infection, unspecified: Secondary | ICD-10-CM

## 2012-11-17 LAB — POCT URINALYSIS DIPSTICK: Urobilinogen, UA: NEGATIVE

## 2012-11-17 MED ORDER — VALACYCLOVIR HCL 500 MG PO TABS: 500.0000 mg | ORAL_TABLET | Freq: Every day | ORAL | Status: DC

## 2012-11-17 NOTE — Progress Notes (Signed)
32 y.o. Divorced Caucasian female   G2P2002 here for annual exam. Pt is currently sexually active.  Pt is happy with bleeding pattern after IUD placed.  Pt had BTL at 2nd c/s, is currentl dating and unsure if she wants another child. She did have difficult pregnancies.  Pt has fever blisters at least once a month and has never been on suppression.  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          Sexually active: yes  The current method of family planning is tubal ligation and Mirena IUD.    Exercising: no  The patient does not participate in regular exercise at present. Last pap: 11/17/11 Negative  Alcohol: 1 occasionally  Tobacco: no BSE: no  Hgb: PCP ; Urine: Leuks 2    Health Maintenance  Topic Date Due  . Tetanus/tdap  09/26/2001  . Influenza Vaccine  08/27/2012  . Pap Smear  11/17/2014    Family History  Problem Relation Age of Onset  . Diabetes Mother     Type 2  . Hypertension Mother   . Cancer Maternal Grandmother   . Breast cancer Maternal Grandmother 50  . Cancer Maternal Grandfather   . Diabetes Maternal Grandfather     Type 1   . Lung cancer Maternal Grandfather   . Heart attack Paternal Grandfather     There are no active problems to display for this patient.   Past Medical History  Diagnosis Date  . Depression   . Mitral valve prolapse   . IBS (irritable bowel syndrome)   . MRSA (methicillin resistant Staphylococcus aureus)     infection on LT side of face, and forehead (02/2011)  . Nephrolithiasis   . UTI (urinary tract infection)   . Heart murmur   . GERD (gastroesophageal reflux disease)     Past Surgical History  Procedure Laterality Date  . Tubal ligation  2011  . Wisdom tooth extraction  2003  . Esophagogastroduodenoscopy  03/25/2011    Procedure: ESOPHAGOGASTRODUODENOSCOPY (EGD);  Surgeon: Barrie Folk, MD;  Location: [facility]  ENDOSCOPY;  Service: Endoscopy;  Laterality: N/A;  . Savory dilation  03/25/2011    Procedure: SAVORY DILATION;  Surgeon: Barrie Folk, MD;  Location[facility] ENDOSCOPY;  Service: Endoscopy;  Laterality: N/A;  . Cesarean section  2007 & 2011    x2    Allergies: Penicillins; Quinolones; and Sulfa antibiotics  Current Outpatient Prescriptions  Medication Sig Dispense Refill  . citalopram (CELEXA) 10 MG tablet Take 10 mg by mouth daily.      Marland Kitchen EPINEPHrine (EPI-PEN) 0.3 mg/0.3 mL DEVI Inject 0.3 mLs (0.3 mg total) into the muscle once.  1 Device  2  . levonorgestrel (MIRENA) 20 MCG/24HR IUD 1 each by Intrauterine route once.      . loratadine (CLARITIN) 10 MG tablet Take 10 mg by mouth daily.       No current facility-administered medications for this visit.    ROS: Pertinent items are noted in HPI.  Exam:    Ht 5' 4.5" (1.638 m)  Wt 140 lb (63.504 kg)  BMI 23.67 kg/m2  LMP 12/30/2011 Weight change: @WEIGHTCHANGE @ Last 3 height recordings:  Ht Readings from Last 3 Encounters:  11/17/12 5' 4.5" (1.638 m)  06/20/12 5\' 4"  (1.626 m)  02/15/12 5\' 5"  (1.651 m)   General appearance: alert, cooperative and appears stated age Head: Normocephalic, without  obvious abnormality, atraumatic Neck: no adenopathy, no carotid bruit, no JVD, supple, symmetrical, trachea midline and thyroid not enlarged, symmetric, no tenderness/mass/nodules Lungs: clear to auscultation bilaterally Breasts: normal appearance, no masses or tenderness Heart: regular rate and rhythm, S1, S2 normal, no murmur, click, rub or gallop Abdomen: soft, non-tender; bowel sounds normal; no masses,  no organomegaly Extremities: extremities normal, atraumatic, no cyanosis or edema Skin: Skin color, texture, turgor normal. No rashes or lesions Lymph nodes: Cervical, supraclavicular, and axillary nodes normal. no inguinal nodes palpated Neurologic: Grossly normal   Pelvic: External genitalia:  no lesions              Urethra: normal  appearing urethra with no masses, tenderness or lesions              Bartholins and Skenes: normal                 Vagina: normal appearing vagina with normal color and discharge, no lesions              Cervix: normal appearance and IUD string visualized              Pap taken: yes        Bimanual Exam:  Uterus:  uterus is normal size, shape, consistency and nontender                                      Adnexa:    normal adnexa in size, nontender and no masses                                      Rectovaginal: Confirms                                      Anus:  normal sphincter tone, no lesions  A: well woman Contemplating pregnancy Recurrent cold sores     P: pap smear with HRHPV Discussed tubal reversal, IVF Suggest due to frequency of cold sore-daily suppression-agrees counseled on breast self exam, adequate intake of calcium and vitamin D, diet and exercise return annually or prn   An After Visit Summary was printed and given to the patient.

## 2012-11-17 NOTE — Patient Instructions (Signed)

## 2012-11-19 LAB — IPS PAP TEST WITH HPV

## 2012-11-21 ENCOUNTER — Other Ambulatory Visit: Payer: Self-pay | Admitting: Gynecology

## 2012-11-21 DIAGNOSIS — IMO0002 Reserved for concepts with insufficient information to code with codable children: Secondary | ICD-10-CM

## 2012-11-23 ENCOUNTER — Telehealth: Payer: Self-pay | Admitting: Orthopedic Surgery

## 2012-11-23 NOTE — Telephone Encounter (Signed)
Message copied by Alfredo Batty on Tue Nov 23, 2012 12:16 PM ------      Message from: Douglass Rivers      Created: Sun Nov 21, 2012  8:47 AM       ASCUS, +HRPV, needs colpo, order dropped ------

## 2012-11-23 NOTE — Telephone Encounter (Signed)
Spoke with pt about Pap results and need for colposcopy. Procedures explained, questions answered. Advised pt her copay will be $50 and procedure covered 100% beyond that per Carolynn's call to ins co. Pt agreeable. Advised pt to take 800 mg of ibuprofen an hour before with food and fluids. Pt has IUD, therefore no menses. Sched appt 12-01-12 at 9:30 with TL.

## 2012-12-01 ENCOUNTER — Ambulatory Visit (INDEPENDENT_AMBULATORY_CARE_PROVIDER_SITE_OTHER): Payer: Managed Care, Other (non HMO) | Admitting: Gynecology

## 2012-12-01 ENCOUNTER — Encounter: Payer: Self-pay | Admitting: Gynecology

## 2012-12-01 ENCOUNTER — Ambulatory Visit: Payer: Managed Care, Other (non HMO) | Admitting: Gynecology

## 2012-12-01 VITALS — BP 126/72 | HR 64 | Resp 12 | Ht 64.5 in | Wt 141.0 lb

## 2012-12-01 DIAGNOSIS — R6889 Other general symptoms and signs: Secondary | ICD-10-CM

## 2012-12-01 DIAGNOSIS — IMO0002 Reserved for concepts with insufficient information to code with codable children: Secondary | ICD-10-CM

## 2012-12-01 NOTE — Progress Notes (Addendum)
Subjective:     Patient ID: Emily [name], female   DOB: [birth year], 32 y.o.   MRN: [id number]  HPI Comments: 32yo G2P2 for colposcopy for ASCUS, +HRHPV on PAP 11/17/2012. contracepting with BTL, mirena IUD in place LMP: 12/29/2101  Procedure explained and patient's questions were invited and answered.  Consent form signed.    Role of HPV in genesis of SIL discussed with patient, and questions answered.        Review of Systems     Objective:   Physical Exam  Constitutional: She is oriented to person, place, and time. She appears well-developed and well-nourished.  Genitourinary:    Neurological: She is alert and oriented to person, place, and time.       Assessment:     ASCUS, +HRHPV     Plan:     Colposcopy:      Speculum inserted atraumatically and cervix visualized.  3% acetic acid applied.  Cervix examined using 3.75 and 7.5, 15   X magnification and green filter.    Gross appearance:normal  Squamocolumnar junction seen in entirety: yes   no visible lesions, no mosaicism, no punctation and no abnormal vasculature  cervix swabbed with Lugol's solution, endocervical speculum placed, SCJ visualized 360 degrees without lesions, endocervical curettage performed, specimen labelled and sent to pathology and hemostasis achieved with Monsel's solution  Extent of lesion entirely seen: yes  Patient tolerated procedure well.   Plan:  Will base further treatment on Pathology findings.  Post biopsy instructions and AVS given to patient.     ENDOCERVIX, CURETTINGS: -STRIPS OF BENIGN ENDOCERVICAL MUCOSA ARE ASSOCIATED WITH ACUTE INFLAMMATION AND A BACKGROUND OF MUCUS. -NEGATIVE FOR ATYPIA OR DYSPLASIA.  Recall 8

## 2012-12-01 NOTE — Patient Instructions (Signed)
Colposcopy Post Procedure Instructions  * You may take Ibuprofen, Aleve, or Tylenol for cramping as if needed.  * If Monsel's solution is used, you may have a slight black discharge for a day or two.   * Light bleeding is normal. If bleeding is heavier than your period, please call our office.  * Refrain from putting anything in your vagina until the bleeding or discharge stops (usually 2 or 3 days).  * You will be notified within one week of your biopsy results, or we will discuss your results at your follow-up appointment if needed.  

## 2013-01-28 ENCOUNTER — Other Ambulatory Visit: Payer: Self-pay | Admitting: Dermatology

## 2013-11-25 ENCOUNTER — Ambulatory Visit: Payer: Managed Care, Other (non HMO) | Admitting: Gynecology

## 2013-11-28 ENCOUNTER — Encounter: Payer: Self-pay | Admitting: Gynecology

## 2013-11-29 ENCOUNTER — Ambulatory Visit: Payer: Managed Care, Other (non HMO) | Admitting: Gynecology

## 2013-12-12 ENCOUNTER — Telehealth: Payer: Self-pay | Admitting: *Deleted

## 2013-12-12 NOTE — Telephone Encounter (Signed)
Patient in 08 recall for ASCUS + HPV on pap done 11/17/12.  Patient had colpo bx done and the recommendations were for a repeat papsmear in 1 year.  Patient scheduled for AEX with Ms. Debbie for 01/06/14, recall extended to 01/10/14.  Please advise.

## 2013-12-14 NOTE — Telephone Encounter (Signed)
Agree with plan to change recall to 01/10/14.  Encounter can be closed.

## 2013-12-20 ENCOUNTER — Ambulatory Visit: Payer: Managed Care, Other (non HMO) | Admitting: Certified Nurse Midwife

## 2014-01-06 ENCOUNTER — Ambulatory Visit (INDEPENDENT_AMBULATORY_CARE_PROVIDER_SITE_OTHER): Payer: BC Managed Care – PPO | Admitting: Certified Nurse Midwife

## 2014-01-06 ENCOUNTER — Encounter: Payer: Self-pay | Admitting: Certified Nurse Midwife

## 2014-01-06 VITALS — BP 122/84 | HR 76 | Ht 64.0 in | Wt 148.0 lb

## 2014-01-06 DIAGNOSIS — B001 Herpesviral vesicular dermatitis: Secondary | ICD-10-CM

## 2014-01-06 DIAGNOSIS — Z01419 Encounter for gynecological examination (general) (routine) without abnormal findings: Secondary | ICD-10-CM

## 2014-01-06 DIAGNOSIS — Z124 Encounter for screening for malignant neoplasm of cervix: Secondary | ICD-10-CM

## 2014-01-06 DIAGNOSIS — R87612 Low grade squamous intraepithelial lesion on cytologic smear of cervix (LGSIL): Secondary | ICD-10-CM

## 2014-01-06 MED ORDER — VALACYCLOVIR HCL 500 MG PO TABS: 500.0000 mg | ORAL_TABLET | Freq: Every day | ORAL | Status: DC

## 2014-01-06 NOTE — Progress Notes (Signed)
33 y.o. 52P2002 Married Caucasian Fe here for annual exam. Periods scant to none with Mirena IUD for menorrhagia. Patient very happy choice. Patient history of IC, with flare 2 months ago.No recent HSV outbreak use only 2 outbreaks needs refill on RX. Sees PCP prn. No other health issues today. Patient treating sore throat.  No LMP recorded. Patient is not currently having periods (Reason: IUD).          Sexually active: Yes.    The current method of family planning is tubal ligation.    Exercising: Yes.    eliptical & floor exercises Smoker:  no  Health Maintenance: Pap:  11-17-12 ASCUS HPV HR +, 12-01-12 neg MMG:  never Colonoscopy:  never BMD:   never TDaP:  Will check with PCP Labs: PCP                 UA: PCP   reports that she has never smoked. She does not have any smokeless tobacco history on file. She reports that she does not drink alcohol or use illicit drugs.  Past Medical History  Diagnosis Date  . Depression   . Mitral valve prolapse   . IBS (irritable bowel syndrome)   . MRSA (methicillin resistant Staphylococcus aureus)     infection on LT side of face, and forehead (02/2011)  . Nephrolithiasis   . UTI (urinary tract infection)   . Heart murmur   . GERD (gastroesophageal reflux disease)     Past Surgical History  Procedure Laterality Date  . Tubal ligation  2011  . Wisdom tooth extraction  2003  . Esophagogastroduodenoscopy  03/25/2011    Procedure: ESOPHAGOGASTRODUODENOSCOPY (EGD);  Surgeon: Barrie FolkJohn C Hayes, MD;  [facility];  Service: Endoscopy;  Laterality: N/A;  . Savory dilation  03/25/2011    Procedure: SAVORY DILATION;  Surgeon: Barrie FolkJohn C Hayes, MD;  [facility];  Service: Endoscopy;  Laterality: N/A;  . Cesarean section  2007 & 2011    x2    Current Outpatient Prescriptions  Medication Sig Dispense Refill  . citalopram (CELEXA) 10 MG tablet Take 10 mg by mouth daily.    Marland Kitchen. EPINEPHrine (EPI-PEN) 0.3 mg/0.3 mL DEVI Inject 0.3 mLs (0.3 mg  total) into the muscle once. 1 Device 2  . levonorgestrel (MIRENA) 20 MCG/24HR IUD 1 each by Intrauterine route once.    . loratadine (CLARITIN) 10 MG tablet Take 10 mg by mouth daily.    . valACYclovir (VALTREX) 500 MG tablet Take 1 tablet (500 mg total) by mouth daily. 30 tablet 12   No current facility-administered medications for this visit.    Family History  Problem Relation Age of Onset  . Diabetes Mother     Type 2  . Hypertension Mother   . Cancer Maternal Grandmother   . Breast cancer Maternal Grandmother 50  . Cancer Maternal Grandfather   . Diabetes Maternal Grandfather     Type 1   . Lung cancer Maternal Grandfather   . Heart attack Paternal Grandfather     ROS:  Pertinent items are noted in HPI.  Otherwise, a comprehensive ROS was negative.  Exam:   BP 122/84 mmHg  Pulse 76  Ht 5\' 4"  (1.626 m)  Wt 148 lb (67.132 kg)  BMI 25.39 kg/m2 Height: 5\' 4"  (162.6 cm)  Ht Readings from Last 3 Encounters:  01/06/14 5\' 4"  (1.626 m)  12/01/12 5' 4.5" (1.638 m)  11/17/12 5' 4.5" (1.638 m)    General appearance: alert, cooperative and  appears stated age Head: Normocephalic, without obvious abnormality, atraumatic Neck: no adenopathy, supple, symmetrical, trachea midline and thyroid normal to inspection and palpation Lungs: clear to auscultation bilaterally Breasts: normal appearance, no masses or tenderness, No nipple retraction or dimpling, No nipple discharge or bleeding, No axillary or supraclavicular adenopathy Heart: regular rate and rhythm Abdomen: soft, non-tender; no masses,  no organomegaly Extremities: extremities normal, atraumatic, no cyanosis or edema Skin: Skin color, texture, turgor normal. No rashes or lesions Lymph nodes: Cervical, supraclavicular, and axillary nodes normal. No abnormal inguinal nodes palpated Neurologic: Grossly normal   Pelvic: External genitalia:  no lesions              Urethra:  normal appearing urethra with no masses, tenderness  or lesions              Bartholin's and Skene's: normal                 Vagina: normal appearing vagina with normal color and discharge, no lesions              Cervix: normal, non tender, IUD string noted              Pap taken: Yes.   Bimanual Exam:  Uterus:  normal size, contour, position, consistency, mobility, non-tender and anteverted              Adnexa: normal adnexa and no mass, fullness, tenderness               Rectovaginal: Confirms               Anus:  normal sphincter tone, no lesions  A:  Well Woman with normal exam  History of menorrhagia with Mirena IUD use working well  IC followed by urology  History of HSV 1  History of ASCUS +HPVHR with negative colpo repeat pap today  P:   Reviewed health and wellness pertinent to exam  Continue follow up as indicate  Rx Valtrex see order  If pap negative repeat one year in not per results  Pap smear taken today with HPVHR   counseled on breast self exam, adequate intake of calcium and vitamin D, diet and exercise  return annually or prn  An After Visit Summary was printed and given to the patient.

## 2014-01-06 NOTE — Patient Instructions (Signed)

## 2014-01-09 NOTE — Progress Notes (Signed)
Reviewed personally.  M. Suzanne Abriella Filkins, MD.  

## 2014-01-10 LAB — IPS PAP TEST WITH HPV

## 2014-01-11 NOTE — Addendum Note (Signed)
Addended by: Verner CholLEONARD, Medford Staheli S on: 01/11/2014 08:50 AM   Modules accepted: Orders, SmartSet

## 2014-01-16 ENCOUNTER — Telehealth: Payer: Self-pay

## 2014-01-16 NOTE — Telephone Encounter (Signed)
-----   Message from Verner Choleborah S Leonard, CNM sent at 01/11/2014  8:46 AM EST ----- Notify patient that pap smear is LSIL with + HPVHR, she will need colposcopy evaluation again Please schedule order in

## 2014-01-16 NOTE — Telephone Encounter (Signed)
Spoke with patient and reviewed results of abnormal pap.  Scheduled appointment for colposcopy 02-01-14 2:00pm.  See result note.

## 2014-01-16 NOTE — Telephone Encounter (Signed)
Called patient at [phone] to discuss pap results and schedule colposcopy, LMOVM to call me back.

## 2014-01-31 ENCOUNTER — Telehealth: Payer: Self-pay | Admitting: Certified Nurse Midwife

## 2014-01-31 NOTE — Telephone Encounter (Signed)
Patient needs to reschedule her colposcopy appointment.due to Sara ChuDebbie [name] being out tomorrow.

## 2014-01-31 NOTE — Telephone Encounter (Signed)
Spoke with patient. Appointment for colposcopy rescheduled to 02/10/14 at 2pm with Emily [name] CNM. Patient is agreeable to date and time. Patient has new insurance for 2016. Patient will fax new insurance card to office tomorrow for precert before appointment. Aware will be called if she will have OOP cost greater than office copay. Patient is agreeable.  Cc: Cathrine MusterSabrina [name]  Routing to provider for final review. Patient agreeable to disposition. Will close encounter

## 2014-02-01 ENCOUNTER — Ambulatory Visit: Payer: BC Managed Care – PPO | Admitting: Certified Nurse Midwife

## 2014-02-03 NOTE — Telephone Encounter (Signed)
Spoke with patient. Advised that per benefit quote received, she will be responsible for  $378.97 when she comes in for colpo next week. Patient asked if we set up payment plans. I advised that we do not.

## 2014-02-03 NOTE — Telephone Encounter (Signed)
PR: $378.97 °

## 2014-02-10 ENCOUNTER — Ambulatory Visit (INDEPENDENT_AMBULATORY_CARE_PROVIDER_SITE_OTHER): Payer: 59 | Admitting: Certified Nurse Midwife

## 2014-02-10 ENCOUNTER — Encounter: Payer: Self-pay | Admitting: Certified Nurse Midwife

## 2014-02-10 DIAGNOSIS — R87612 Low grade squamous intraepithelial lesion on cytologic smear of cervix (LGSIL): Secondary | ICD-10-CM

## 2014-02-10 NOTE — Progress Notes (Addendum)
Patient ID: Emily [name], female   DOB: [birth year], 34 y.o.   MRN: [id number]  Chief Complaint  Patient presents with  . Colposcopy    HPI Emily [name] is a 34 y.o. female.  Married white female g 2 p2002 here for colposcopy exam. Denies vaginal bleeding or pelvic pain. Contraception Mirena IUD.   Mother in attendance at request of patient with verbal consent. HPI  Indications: Pap smear on 12/11 2016 showed: low-grade squamous intraepithelial neoplasia (LGSIL - encompassing HPV,mild dysplasia,CIN I). Previous colposcopy: in 12/01/12 for ASCUS + HPVHR with negative,colpo. with Prior cervical treatment: no treatment.  Past Medical History  Diagnosis Date  . Depression   . Mitral valve prolapse   . IBS (irritable bowel syndrome)   . MRSA (methicillin resistant Staphylococcus aureus)     infection on LT side of face, and forehead (02/2011)  . Nephrolithiasis   . UTI (urinary tract infection)   . Heart murmur   . GERD (gastroesophageal reflux disease)   . Abnormal Pap smear of cervix     2014 ASCUS HPV HR+, 2015 LSIL HPV HR+    Past Surgical History  Procedure Laterality Date  . Tubal ligation  2011  . Wisdom tooth extraction  2003  . Esophagogastroduodenoscopy  03/25/2011    Procedure: ESOPHAGOGASTRODUODENOSCOPY (EGD);  Surgeon: Barrie Folk, MD;  Location: [facility] ENDOSCOPY;  Service: Endoscopy;  Laterality: N/A;  . Savory dilation  03/25/2011    Procedure: SAVORY DILATION;  Surgeon: Barrie Folk, MD;  Location: [facility] ENDOSCOPY;  Service: Endoscopy;  Laterality: N/A;  . Cesarean section  2007 & 2011    x2  . Colposcopy  2014    neg    Family History  Problem Relation Age of Onset  . Diabetes Mother     Type 2  . Hypertension Mother   . Cancer Maternal Grandmother   . Breast cancer Maternal Grandmother 50  . Cancer Maternal Grandfather   . Diabetes Maternal Grandfather     Type 1   . Lung cancer Maternal Grandfather   . Heart attack Paternal Grandfather     Social  History History  Substance Use Topics  . Smoking status: Never Smoker   . Smokeless tobacco: Not on file  . Alcohol Use: No    Allergies  Allergen Reactions  . Penicillins     Her mom unsure if her or her sister is allergic, but patient has had medications in the PCN family without any problem   . Quinolones     Rash   . Sulfa Antibiotics Swelling    "swelling of tongue, hives"    Current Outpatient Prescriptions  Medication Sig Dispense Refill  . Cholecalciferol (VITAMIN D PO) Take by mouth daily.    Marland Kitchen levonorgestrel (MIRENA) 20 MCG/24HR IUD 1 each by Intrauterine route once.    . loratadine (CLARITIN) 10 MG tablet Take 10 mg by mouth daily.    . Multiple Vitamins-Minerals (MULTIVITAMIN PO) Take by mouth daily.    . valACYclovir (VALTREX) 500 MG tablet Take 1 tablet (500 mg total) by mouth daily. 30 tablet 12  . EPINEPHrine (EPI-PEN) 0.3 mg/0.3 mL DEVI Inject 0.3 mLs (0.3 mg total) into the muscle once. (Patient not taking: Reported on 02/10/2014) 1 Device 2   No current facility-administered medications for this visit.    Review of Systems Review of Systems  Constitutional: Negative.   Genitourinary: Negative for vaginal bleeding, vaginal discharge and vaginal pain.    Blood pressure 110/70, pulse  68, resp. rate 16, height 5\' 4"  (1.626 m), weight 145 lb (65.772 kg).  Physical Exam Physical Exam  Constitutional: She is oriented to person, place, and time. She appears well-developed and well-nourished.  Genitourinary: Vagina normal.    Neurological: She is alert and oriented to person, place, and time.  Skin: Skin is warm and dry.  Psychiatric: She has a normal mood and affect. Her behavior is normal. Judgment and thought content normal.    Data Reviewed Reviewed pap smear results with questions answered.  Assessment   LSIL pap smear here for colposcopy exam Procedure Details  The risks and benefits of the procedure and Written informed consent  obtained.  Speculum placed in vagina and excellent visualization of cervix achieved, cervix swabbed x 3 with saline solution and acetic acid solution. Acetowhite response noted at 6 o'clock and 11 o'clock. SCJ visualized. IUD string noted in cervix. Lugol's solution applied with non staining noted in same area. Cervix viewed with 3.75,7.5, 15 # and green filter. Biopsy taken at 6 and 11 o'clock. Ecc obtained. Monsel's applied. No active bleeding noted upon speculum removal. Patient tolerated procedure well. Instructions given.  Specimens: 3  Complications: none.     Plan    Specimens labelled and sent to Pathology. Patient will be contacted with results when reviewed.   Pathology reviewed and biopsy at 6 o'clock showed LSIL, CIN 1 with HPV effect Biopsy at 11 o'clock showed benign tissue ECC showed benign glandular tissue with no dysplasia or HPV effect Correlate well with Pap results Patient to be called with results and need for repeat pap smear one year. Pap recall.   Emily [name] 02/10/2014, 2:36 PM

## 2014-02-10 NOTE — Progress Notes (Signed)
Pap 01-06-14 LSIL +HPV HR Previous colpo 12-01-12 neg Pt took 600mg  ibuprofen at 2:30pm

## 2014-02-10 NOTE — Progress Notes (Signed)
Reviewed personally.  M. Suzanne Deandra Goering, MD.  

## 2014-02-10 NOTE — Patient Instructions (Signed)

## 2014-02-14 LAB — IPS OTHER TISSUE BIOPSY

## 2014-02-15 ENCOUNTER — Telehealth: Payer: Self-pay

## 2014-02-15 NOTE — Telephone Encounter (Signed)
-----   Message from Verner Choleborah S Leonard, CNM sent at 02/14/2014  2:46 PM EST ----- Notify patient results of colposcopy showed on biopsy at 6 o'clock LSIL, CIN1 Biopsy at 11 o'clock was benign tissue ECC showed benign glandular tissue with no dysplasia or HPV effect Patient needs repeat pap smear one year. Stress importance for follow up Pap recall 08

## 2014-02-15 NOTE — Telephone Encounter (Signed)
Spoke with patient. Advised patient of message as seen below from Verner Choleborah S. Leonard CNM. Patient is agreeable and verbalizes understanding. Patient has aex scheduled for 01/08/15. 08 recall.   Routing to provider for final review. Patient agreeable to disposition. Will close encounter ;

## 2014-04-25 ENCOUNTER — Ambulatory Visit (INDEPENDENT_AMBULATORY_CARE_PROVIDER_SITE_OTHER): Payer: 59 | Admitting: Physician Assistant

## 2014-04-25 VITALS — BP 116/74 | HR 75 | Temp 97.7°F | Ht 64.5 in | Wt 146.0 lb

## 2014-04-25 DIAGNOSIS — N3090 Cystitis, unspecified without hematuria: Secondary | ICD-10-CM | POA: Diagnosis not present

## 2014-04-25 DIAGNOSIS — N3010 Interstitial cystitis (chronic) without hematuria: Secondary | ICD-10-CM

## 2014-04-25 DIAGNOSIS — B379 Candidiasis, unspecified: Secondary | ICD-10-CM | POA: Diagnosis not present

## 2014-04-25 DIAGNOSIS — R300 Dysuria: Secondary | ICD-10-CM

## 2014-04-25 DIAGNOSIS — T3695XA Adverse effect of unspecified systemic antibiotic, initial encounter: Secondary | ICD-10-CM

## 2014-04-25 LAB — POCT URINALYSIS DIPSTICK
BILIRUBIN UA: NEGATIVE
Glucose, UA: NEGATIVE
Ketones, UA: NEGATIVE
NITRITE UA: NEGATIVE
PH UA: 7
Protein, UA: NEGATIVE
Spec Grav, UA: 1.02
Urobilinogen, UA: 0.2

## 2014-04-25 LAB — POCT UA - MICROSCOPIC ONLY
Amorphous: POSITIVE
CASTS, UR, LPF, POC: NEGATIVE
Crystals, Ur, HPF, POC: NEGATIVE
MUCUS UA: NEGATIVE
Yeast, UA: NEGATIVE

## 2014-04-25 MED ORDER — NITROFURANTOIN MONOHYD MACRO 100 MG PO CAPS: 100.0000 mg | ORAL_CAPSULE | Freq: Two times a day (BID) | ORAL | Status: DC

## 2014-04-25 MED ORDER — FLUCONAZOLE 150 MG PO TABS: 150.0000 mg | ORAL_TABLET | Freq: Once | ORAL | Status: DC

## 2014-04-25 MED ORDER — NITROFURANTOIN MONOHYD MACRO 100 MG PO CAPS: 100.0000 mg | ORAL_CAPSULE | Freq: Two times a day (BID) | ORAL | Status: AC

## 2014-04-25 NOTE — Progress Notes (Signed)
Subjective:    Patient ID: Emily [name], female    DOB: [birth year], 34 y.o.   MRN: [id number]  HPI  This is a 34 year old female with PMH interstitial cystitis who is presenting with 2 days of dysuria, urinary frequency and lower abdominal pressure. Symptoms are worsening and she states this is how she knows this is not her IC and her IC symptoms usually do not worsen. Has a history of kidney stones. Denies back pain, fever, N/V, hematuria. Has not tried anything for her symptoms although in the past AZO helps. Gets 5-6 UTIs a year. She was last sexually active over 2 weeks ago. Takes cranberry tablets daily. Last saw urologist 1 year ago. States she doesn't go often because her co-pay is too expensive.  Review of Systems  Constitutional: Negative for fever and chills.  Gastrointestinal: Positive for abdominal pain. Negative for nausea and vomiting.  Genitourinary: Positive for dysuria and frequency. Negative for hematuria, vaginal bleeding, vaginal discharge and menstrual problem.  Musculoskeletal: Negative for back pain.  Skin: Negative for rash.  Hematological: Negative for adenopathy.   Patient Active Problem List   Diagnosis Date Noted  . Interstitial cystitis 04/25/2014    Prior to Admission medications   Medication Sig Start Date End Date Taking? Authorizing Provider  EPINEPHrine (EPI-PEN) 0.3 mg/0.3 mL DEVI Inject 0.3 mLs (0.3 mg total) into the muscle once. 02/15/12  Yes Collene Gobble, MD  levonorgestrel (MIRENA) 20 MCG/24HR IUD 1 each by Intrauterine route once.   Yes Historical Provider, MD  loratadine (CLARITIN) 10 MG tablet Take 10 mg by mouth daily.   Yes Historical Provider, MD  Multiple Vitamins-Minerals (MULTIVITAMIN PO) Take by mouth daily.   Yes Historical Provider, MD  valACYclovir (VALTREX) 500 MG tablet Take 1 tablet (500 mg total) by mouth daily. 01/06/14  Yes Verner Chol, CNM   Allergies  Allergen Reactions  . Penicillins     Her mom unsure if her or  her sister is allergic, but patient has had medications in the PCN family without any problem   . Quinolones     Rash   . Sulfa Antibiotics Swelling    "swelling of tongue, hives"   Patient's social and family history were reviewed.     Objective:   Physical Exam  Constitutional: She is oriented to person, place, and time. She appears well-developed and well-nourished. No distress.  HENT:  Head: Normocephalic and atraumatic.  Right Ear: Hearing normal.  Left Ear: Hearing normal.  Nose: Nose normal.  Eyes: Conjunctivae and lids are normal. Right eye exhibits no discharge. Left eye exhibits no discharge. No scleral icterus.  Cardiovascular: Normal rate, regular rhythm, normal heart sounds and normal pulses.   No murmur heard. Pulmonary/Chest: Effort normal and breath sounds normal. No respiratory distress. She has no wheezes. She has no rhonchi. She has no rales.  Abdominal: Soft. Normal appearance and bowel sounds are normal. There is tenderness in the suprapubic area. There is no CVA tenderness.  Musculoskeletal: Normal range of motion.  Neurological: She is alert and oriented to person, place, and time.  Skin: Skin is warm, dry and intact. No lesion and no rash noted.  Psychiatric: She has a normal mood and affect. Her speech is normal and behavior is normal. Thought content normal.   BP 116/74 mmHg  Pulse 75  Temp(Src) 97.7 F (36.5 C) (Oral)  Ht 5' 4.5" (1.638 m)  Wt 146 lb (66.225 kg)  BMI 24.68 kg/m2  SpO2  97%  Results for orders placed or performed in visit on 04/25/14  POCT urinalysis dipstick  Result Value Ref Range   Color, UA yellow    Clarity, UA cloudy    Glucose, UA negative    Bilirubin, UA negative    Ketones, UA negative    Spec Grav, UA 1.020    Blood, UA trace-intact    pH, UA 7.0    Protein, UA negative    Urobilinogen, UA 0.2    Nitrite, UA negative    Leukocytes, UA moderate (2+)   POCT UA - Microscopic Only  Result Value Ref Range   WBC, Ur,  HPF, POC TNTC    RBC, urine, microscopic 3-6    Bacteria, U Microscopic 3+    Mucus, UA negative    Epithelial cells, urine per micros 1-4    Crystals, Ur, HPF, POC negative    Casts, Ur, LPF, POC negative    Yeast, UA negative    Amorphous positive       Assessment & Plan:  1. Dysuria 2. Cystitis 3. Antibiotic-induced yeast infection 4. Interstitial cystitis UA suggestive of UTI. Urine clx pending. Will treat with macrobid. Diflucan for potential yeast infection. Advised she return to see her urologist for further evaluation and management of IC and recurrent UTIs. She is in agreement with plan. - POCT urinalysis dipstick - POCT UA - Microscopic Only - Urine culture - nitrofurantoin, macrocrystal-monohydrate, (MACROBID) 100 MG capsule; Take 1 capsule (100 mg total) by mouth 2 (two) times daily.  Dispense: 14 capsule; Refill: 0 - fluconazole (DIFLUCAN) 150 MG tablet; Take 1 tablet (150 mg total) by mouth once. Repeat if needed  Dispense: 2 tablet; Refill: 0   Roswell MinersNicole V. Dyke BrackettBush, PA-C, MHS [facility]  04/25/2014

## 2014-04-25 NOTE — Patient Instructions (Addendum)
Take the antibiotic twice a day for 7 days. I will call you with the results of your urine culture. Drink plenty of water and continue to take the cranberry pills. Make an appointment with your urologist.

## 2014-04-28 ENCOUNTER — Telehealth: Payer: Self-pay

## 2014-04-28 LAB — URINE CULTURE

## 2014-04-28 NOTE — Telephone Encounter (Signed)
Pt LM on lab VM saying she is not feeling any better and wants to make sure she is on the right abx. Please review. Thanks

## 2014-04-28 NOTE — Telephone Encounter (Signed)
She is on the right antibiotic. Urine culture showed she was sensitive to macrobid which is what she is on. She should be getting better on this abx. If not, she needs to make appt with her urologist.

## 2014-04-29 NOTE — Telephone Encounter (Signed)
Gave pt message. She says it doesn't feel worse, just doesn't feel much better. Informed pt if sx's get worse to RTC. Otherwise, go to her appt at urology that she has on the 3rd.

## 2014-05-10 ENCOUNTER — Telehealth: Payer: Self-pay | Admitting: Certified Nurse Midwife

## 2014-05-10 ENCOUNTER — Ambulatory Visit (INDEPENDENT_AMBULATORY_CARE_PROVIDER_SITE_OTHER): Payer: 59 | Admitting: Physician Assistant

## 2014-05-10 VITALS — BP 110/60 | HR 85 | Temp 97.6°F | Resp 16 | Ht 64.0 in | Wt 146.4 lb

## 2014-05-10 DIAGNOSIS — N3010 Interstitial cystitis (chronic) without hematuria: Secondary | ICD-10-CM

## 2014-05-10 DIAGNOSIS — R350 Frequency of micturition: Secondary | ICD-10-CM | POA: Diagnosis not present

## 2014-05-10 DIAGNOSIS — N3289 Other specified disorders of bladder: Secondary | ICD-10-CM

## 2014-05-10 DIAGNOSIS — R011 Cardiac murmur, unspecified: Secondary | ICD-10-CM | POA: Insufficient documentation

## 2014-05-10 DIAGNOSIS — R3989 Other symptoms and signs involving the genitourinary system: Secondary | ICD-10-CM

## 2014-05-10 DIAGNOSIS — J309 Allergic rhinitis, unspecified: Secondary | ICD-10-CM | POA: Insufficient documentation

## 2014-05-10 DIAGNOSIS — L503 Dermatographic urticaria: Secondary | ICD-10-CM | POA: Insufficient documentation

## 2014-05-10 LAB — POCT URINALYSIS DIPSTICK
Bilirubin, UA: NEGATIVE
GLUCOSE UA: NEGATIVE
Ketones, UA: NEGATIVE
PROTEIN UA: 30
SPEC GRAV UA: 1.02
UROBILINOGEN UA: 0.2
pH, UA: 7.5

## 2014-05-10 LAB — POCT UA - MICROSCOPIC ONLY
CRYSTALS, UR, HPF, POC: NEGATIVE
Casts, Ur, LPF, POC: NEGATIVE
Mucus, UA: NEGATIVE
Yeast, UA: NEGATIVE

## 2014-05-10 MED ORDER — FLUCONAZOLE 150 MG PO TABS: 150.0000 mg | ORAL_TABLET | Freq: Once | ORAL | Status: DC

## 2014-05-10 MED ORDER — AMOXICILLIN-POT CLAVULANATE 875-125 MG PO TABS: 1.0000 | ORAL_TABLET | Freq: Two times a day (BID) | ORAL | Status: AC

## 2014-05-10 NOTE — Progress Notes (Signed)
Subjective:    Patient ID: Emily [name], female    DOB: 09/26/1980, 34 y.o.   MRN: [id number]  HPI  Emily [name] is a pleasant 34 year old Caucasian female who presents today for evaluation of the sensation of bladder pressure and urinary frequency.  She complains of symptoms of urinary pressure, pain, chills, with occasional burning sensation after urinating. The pain was so severe today that is caused her to leave work and seek treatment. She denies any back pain. She was recently seen here on 04/25/14 for similar symptoms and for a UTI. She was treated at the last visit with Nitrofurantoin and noted her symptoms resolved after taking the medication. She was also given Diflucan at the last visit which she also completed. Symptoms completely resolved after taking the medications, but symptoms returned today. She frequently uses AZO for symptomatic relief. She is cognizant of her diet and fluid intake, and tries to stay hydrated and avoid trigger foods and drinks, such as Sprite and iced tea. She has proper hygiene and tries to avoid taking bubble baths and sticks to mainly taking showers. She has tried sitz bath previously to help relieve symptoms. She urinates after having sexual intercourse to reduce the risk of developing a urinary tract infection. She has not engaged in sexual intercourse for more than two weeks.  She has an extensive history of recurrent urinary tract infections and interstitial cystitis. This is her fifth UTI in the past 6 months. She was diagnosed with IC two years ago. Her father also has IC. She sees a urologist, but feels she does not receive satisfactory treatment from him. Most recent visit with him was 05/03/14. She did not present with similar urinary tract infection symptoms at that time, and per patient her urine culture was negative at that visit, which she presumed because she was asymptomatic. The urologist was unsure why she was scheduled to follow-up with him,  because he felt that her UTI symptoms could be treated by a primary care provider. He scheduled her for a KUB x-ray in six weeks to see if her recurrent symptoms are related to IC or a possible kidney stone.   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   She has a Mirena IUD in place which has not caused any problems. She had it removed awhile ago thinking that it contributed to her bladder symptoms, but her gynecologist said it was very unlikely, and reinserted the Mirena IUD.  Review of Systems  Constitutional: Positive for chills. Negative for fever, diaphoresis and fatigue.  HENT: Positive for congestion. Negative for ear pain, postnasal drip, rhinorrhea, sinus pressure, sneezing and sore throat.   Eyes: Negative for pain and itching.  Respiratory: Negative for cough, chest tightness and shortness of breath.   Cardiovascular: Negative for chest pain and palpitations.  Gastrointestinal: Positive for abdominal pain (Suprapubic tenderness). Negative for nausea, vomiting, diarrhea and constipation.  Genitourinary: Positive for dysuria and frequency. Negative for hematuria, flank pain and vaginal bleeding.       Pressure sensation when urinating.  Skin: Negative for rash.  Allergic/Immunologic: Negative for environmental allergies.  Neurological: Negative for headaches.       Objective:   Physical Exam  Constitutional: She is oriented to person, place, and time. She appears well-developed and well-nourished. No distress.  BP 110/60 mmHg  Pulse 85  Temp(Src) 97.6 F (36.4 C) (Oral)  Resp 16  Ht  (1.626 m)  Wt 146 lb 6.4 oz (66.407 kg)  BMI 25.12 kg/m2  SpO2 99%  HENT:  Head: Normocephalic and atraumatic.  Eyes: Conjunctivae are normal.  Neck: Neck supple.  Cardiovascular: Normal rate and regular rhythm.   Murmur heard. Pulmonary/Chest: Effort normal and breath sounds normal. She has no wheezes. She has no rales.  Abdominal: Soft. Bowel sounds are normal.  She exhibits no mass. There is tenderness in the suprapubic area. There is no CVA tenderness.  Lymphadenopathy:    She has no cervical adenopathy.  Neurological: She is alert and oriented to person, place, and time.  Skin: Skin is warm and dry. No rash noted. No erythema.  Psychiatric: She has a normal mood and affect.      Results for orders placed or performed in visit on 05/10/14 (from the past 24 hour(s))  POCT urinalysis dipstick     Status: None   Collection Time: 05/10/14  2:52 PM  Result Value Ref Range   Color, UA yellow    Clarity, UA cloudy    Glucose, UA neg    Bilirubin, UA neg    Ketones, UA neg    Spec Grav, UA 1.020    Blood, UA large    pH, UA 7.5    Protein, UA 30    Urobilinogen, UA 0.2    Nitrite, UA postive    Leukocytes, UA large (3+)   POCT UA - Microscopic Only     Status: None   Collection Time: 05/10/14  2:52 PM  Result Value Ref Range   WBC, Ur, HPF, POC tntc    RBC, urine, microscopic tntc    Bacteria, U Microscopic trace    Mucus, UA neg    Epithelial cells, urine per micros 0-1    Crystals, Ur, HPF, POC neg    Casts, Ur, LPF, POC neg    Yeast, UA neg    Amorphous large        Assessment & Plan:  1. Sensation of pressure in bladder area - POCT urinalysis dipstick - POCT UA - Microscopic Only - Urine culture  2. Urination frequency - POCT urinalysis dipstick - POCT UA - Microscopic Only - amoxicillin-clavulanate (AUGMENTIN) 875-125 MG per tablet; Take 1 tablet by mouth 2 (two) times daily.  Dispense: 20 tablet; Refill: 0 - fluconazole (DIFLUCAN) 150 MG tablet; Take 1 tablet (150 mg total) by mouth once. Repeat if needed  Dispense: 2 tablet; Refill: 0  3. Interstitial cystitis - Discussed starting Amitriptyline for pressure and pain relief. She did not want to start this because of concern for weight gain.  - Also discussed trying pelvic floor physical therapy. - Patient may attempt to see a different urologist at a different practice  for further treatment  - Printed out copies of her visits here dating back to 2011 in which a urine culture was obtained, so that she could prove to the urologist that she has recurrent UTI's with positive cultures that were treated.

## 2014-05-10 NOTE — Patient Instructions (Signed)
When you're asking for help, you need to let the specialist know that you have IC, AND that you're having recurrent UTI. Ask about treatment to prevent flares (like Elavil) and pelvic floor PT.  Avoid food and drink that tend to trigger your symptoms.

## 2014-05-10 NOTE — Telephone Encounter (Signed)
Left message regarding upcoming appointment has been canceled and needs to be rescheduled. °

## 2014-05-10 NOTE — Progress Notes (Signed)
Patient ID: Emily [name], female    DOB: 29-May-1980, 34 y.o.   MRN: [id number]  PCP: REDMON,NOELLE, PA-C  Subjective:   Chief Complaint  Patient presents with  . Urinary Tract Infection    HPI  Presents today for re-evaluation of the sensation of bladder pressure and urinary frequency.  She complains of symptoms of urinary pressure, pain, chills, with occasional burning sensation after urinating. The pain was so severe today that is caused her to leave work and seek treatment. She denies any back pain.   She was seen here on 04/25/14 for similar symptoms and treated for a presumptive UTI with Nitrofurantoin and noted her symptoms resolved after taking the medication. She was also given Diflucan at the last visit which she also completed. Urine culture confirmed UTI and susceptibility to prescribed medication.   Symptoms completely resolved after taking the medications, but symptoms returned today.   She has interstitial cystitis and so frequently uses AZO for symptomatic relief. She is cognizant of her diet and fluid intake, and tries to stay hydrated and avoid trigger foods and drinks, such as Sprite and iced tea. She has proper hygiene and tries to avoid taking bubble baths and sticks to mainly taking showers. She has tried sitz bath previously to help relieve symptoms. She urinates after having sexual intercourse to reduce the risk of developing a urinary tract infection. She has not engaged in sexual intercourse for more than two weeks.  She has an extensive history of recurrent urinary tract infections and interstitial cystitis. This is her fifth UTI in the past 6 months. UTIs feel different than IC flares. She was diagnosed with IC two years ago. Her father also has IC. She sees a urologist, but feels she does not receive satisfactory treatment from him. Most recent visit with him was 05/03/14. She did not have any urinary symptoms at that time. The urologist was unsure why she was  scheduled to follow-up with him, because he felt that her UTI symptoms could be treated by a primary care provider. He scheduled her for a KUB x-ray in six weeks to see if her recurrent symptoms are related to IC or a possible kidney stone.   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   She has a Mirena IUD in place which has not caused any problems. She had it removed awhile ago thinking that it contributed to her bladder symptoms, but her gynecologist said it was very unlikely, and reinserted the Mirena IUD.      Review of Systems Review of Systems  Constitutional: Positive for chills. Negative for fever, diaphoresis and fatigue.  HENT: Positive for congestion. Negative for ear pain, postnasal drip, rhinorrhea, sinus pressure, sneezing and sore throat.  Eyes: Negative for pain and itching.  Respiratory: Negative for cough, chest tightness and shortness of breath.  Cardiovascular: Negative for chest pain and palpitations.  Gastrointestinal: Positive for abdominal pain (Suprapubic tenderness). Negative for nausea, vomiting, diarrhea and constipation.  Genitourinary: Positive for dysuria and frequency. Negative for hematuria, flank pain and vaginal bleeding.   Pressure sensation when urinating.  Skin: Negative for rash.  Allergic/Immunologic: Negative for environmental allergies.  Neurological: Negative for headaches.      Patient Active Problem List   Diagnosis Date Noted  . Cardiac murmur 05/10/2014  . Allergic rhinitis 05/10/2014  . 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 Interstitial cystitis 04/25/2014     Prior to Admission medications   Medication Sig Start Date End Date Taking? Authorizing Provider  EPINEPHrine (EPI-PEN) 0.3 mg/0.3 mL DEVI Inject 0.3 mLs (0.3 mg total) into the muscle once. 02/15/12  Yes Collene GobbleSteven A Daub, MD  fluconazole (DIFLUCAN) 150 MG tablet Take 1 tablet (150 mg total) by mouth once. Repeat if needed 04/25/14  Yes Dorna LeitzNicole Bush V, PA-C    levonorgestrel (MIRENA) 20 MCG/24HR IUD 1 each by Intrauterine route once.   Yes Historical Provider, MD  loratadine (CLARITIN) 10 MG tablet Take 10 mg by mouth daily.   Yes Historical Provider, MD  Multiple Vitamins-Minerals (MULTIVITAMIN PO) Take by mouth daily.   Yes Historical Provider, MD  valACYclovir (VALTREX) 500 MG tablet Take 1 tablet (500 mg total) by mouth daily. 01/06/14  Yes Verner Choleborah S Leonard, CNM     Allergies  Allergen Reactions  . Penicillins     Her mom unsure if her or her sister is allergic, but patient has had medications in the [facility] family without any problem   . Quinolones     Rash   . Sulfa Antibiotics Swelling    "swelling of tongue, hives"       Objective:  Physical Exam  Physical Exam  Constitutional: She is oriented to person, place, and time. Vital signs are normal. She appears well-developed and well-nourished. She is active and cooperative. No distress.  BP 110/60 mmHg  Pulse 85  Temp(Src) 97.6 F (36.4 C) (Oral)  Resp 16  Ht 5\' 4"  (1.626 m)  Wt 146 lb 6.4 oz (66.407 kg)  BMI 25.12 kg/m2  SpO2 99%   HENT:  Head: Normocephalic and atraumatic.  Neck: Phonation normal. No spinous process tenderness and no muscular tenderness present. No thyroid mass and no thyromegaly present.  Cardiovascular: Normal rate, regular rhythm and normal heart sounds.   Pulmonary/Chest: Effort normal and breath sounds normal.  Abdominal: Soft. Normal appearance and bowel sounds are normal. She exhibits no distension and no mass. There is no hepatosplenomegaly. There is tenderness in the suprapubic area. There is no rigidity, no rebound, no guarding, no CVA tenderness, no tenderness at McBurney's point and negative Murphy's sign. No hernia.  Musculoskeletal: Normal range of motion.       Lumbar back: Normal.  Neurological: She is alert and oriented to person, place, and time.  Skin: Skin is warm and dry. No rash noted. She is not diaphoretic. No pallor.  Psychiatric:  She has a normal mood and affect. Her speech is normal and behavior is normal. Judgment normal.   Results for orders placed or performed in visit on 05/10/14  POCT urinalysis dipstick  Result Value Ref Range   Color, UA yellow    Clarity, UA cloudy    Glucose, UA neg    Bilirubin, UA neg    Ketones, UA neg    Spec Grav, UA 1.020    Blood, UA large    pH, UA 7.5    Protein, UA 30    Urobilinogen, UA 0.2    Nitrite, UA postive    Leukocytes, UA large (3+)   POCT UA - Microscopic Only  Result Value Ref Range   WBC, Ur, HPF, POC tntc    RBC, urine, microscopic tntc    Bacteria, U Microscopic trace    Mucus, UA neg    Epithelial cells, urine per micros 0-1    Crystals, Ur, HPF, POC neg    Casts, Ur, LPF, POC neg    Yeast, UA neg    Amorphous large            Assessment &  Plan:  1. Sensation of pressure in bladder area 2. Urination frequency Urine cultures from visits within our system reviewed with her. True infection 04/25/2014. Prior to that there was bacteria, but insufficient numbers of colonies to be considered infection. She has sought care at several other facilities for UTI symptoms as well. It is unfortunate that she wasn't able to communicate to the urologist that the reason for her visit was to address the increased frequency of UTI, and she is encouraged to advocate for herself at the next visit.  If she still does not feel that she is getting what she needs, it would be appropriate to seek specialty care elsewhere. Treat this episode with Augmentin, based on review of recent sensitivities and discussion-she has tolerated PCN-family antibiotics since the possible reaction during childhood. - POCT urinalysis dipstick - POCT UA - Microscopic Only - amoxicillin-clavulanate (AUGMENTIN) 875-125 MG per tablet; Take 1 tablet by mouth 2 (two) times daily.  Dispense: 20 tablet; Refill: 0 - fluconazole (DIFLUCAN) 150 MG tablet; Take 1 tablet (150 mg total) by mouth once. Repeat  if needed  Dispense: 2 tablet; Refill: 0  3. Interstitial cystitis She will continue to use AZO for treatment of symptoms flares, and work to avoid triggers. When she follows up with urology, I encourage her to ask about treatments that can prevent flares, including Elavil and pelvic floor PT.    Fernande Bras, PA-C Physician Assistant-Certified [facility]

## 2014-05-13 LAB — URINE CULTURE: Colony Count: >=100000

## 2014-07-23 ENCOUNTER — Ambulatory Visit (INDEPENDENT_AMBULATORY_CARE_PROVIDER_SITE_OTHER): Payer: 59 | Admitting: Family Medicine

## 2014-07-23 VITALS — BP 100/70 | HR 75 | Temp 98.7°F | Resp 16 | Ht 65.25 in | Wt 141.0 lb

## 2014-07-23 DIAGNOSIS — L259 Unspecified contact dermatitis, unspecified cause: Secondary | ICD-10-CM | POA: Diagnosis not present

## 2014-07-23 DIAGNOSIS — R21 Rash and other nonspecific skin eruption: Secondary | ICD-10-CM | POA: Diagnosis not present

## 2014-07-23 MED ORDER — PREDNISONE 20 MG PO TABS: 40.0000 mg | ORAL_TABLET | Freq: Every day | ORAL | Status: DC

## 2014-07-23 MED ORDER — PREDNISONE 20 MG PO TABS
40.0000 mg | ORAL_TABLET | Freq: Every day | ORAL | Status: DC
Start: 2014-07-23 — End: 2014-07-23

## 2014-07-23 NOTE — Patient Instructions (Signed)
Your rash appears to be either a contact dermatitis, allergic reaction and hives, or possible photodermatitis. Avoid sun to these areas next 4-5 days, start prednisone as discussed, continue claritin once per day or benadryl over the counter every 4-6 hours, and cortisone cream to itching areas today and tomorrow if needed. If rash not improving in next 4-5 days, or worsening sooner - return for recheck.  Return to the clinic or go to the nearest emergency room if any of your symptoms worsen or new symptoms occur.  Rash A rash is a change in the color or texture of your skin. There are many different types of rashes. You may have other problems that accompany your rash. CAUSES   Infections.  Allergic reactions. This can include allergies to pets or foods.  Certain medicines.  Exposure to certain chemicals, soaps, or cosmetics.  Heat.  Exposure to poisonous plants.  Tumors, both cancerous and noncancerous. SYMPTOMS   Redness.  Scaly skin.  Itchy skin.  Dry or cracked skin.  Bumps.  Blisters.  Pain. DIAGNOSIS  Your caregiver may do a physical exam to determine what type of rash you have. A skin sample (biopsy) may be taken and examined under a microscope. TREATMENT  Treatment depends on the type of rash you have. Your caregiver may prescribe certain medicines. For serious conditions, you may need to see a skin doctor (dermatologist). HOME CARE INSTRUCTIONS   Avoid the substance that caused your rash.  Do not scratch your rash. This can cause infection.  You may take cool baths to help stop itching.  Only take over-the-counter or prescription medicines as directed by your caregiver.  Keep all follow-up appointments as directed by your caregiver. SEEK IMMEDIATE MEDICAL CARE IF:  You have increasing pain, swelling, or redness.  You have a fever.  You have new or severe symptoms.  You have body aches, diarrhea, or vomiting.  Your rash is not better after 3  days. MAKE SURE YOU:  Understand these instructions.  Will watch your condition.  Will get help right away if you are not doing well or get worse. Document Released: 01/03/2002 Document Revised: 04/07/2011 Document Reviewed: 10/28/2010 [facility] Patient Information 2015 [place], [place]. This information is not intended to replace advice given to you by your health care provider. Make sure you discuss any questions you have with your health care provider.   Contact Dermatitis Contact dermatitis is a reaction to certain substances that touch the skin. Contact dermatitis can be either irritant contact dermatitis or allergic contact dermatitis. Irritant contact dermatitis does not require previous exposure to the substance for a reaction to occur.Allergic contact dermatitis only occurs if you have been exposed to the substance before. Upon a repeat exposure, your body reacts to the substance.  CAUSES  Many substances can cause contact dermatitis. Irritant dermatitis is most commonly caused by repeated exposure to mildly irritating substances, such as:  Makeup.  Soaps.  Detergents.  Bleaches.  Acids.  Metal salts, such as nickel. Allergic contact dermatitis is most commonly caused by exposure to:  Poisonous plants.  Chemicals (deodorants, shampoos).  Jewelry.  Latex.  Neomycin in triple antibiotic cream.  Preservatives in products, including clothing. SYMPTOMS  The area of skin that is exposed may develop:  Dryness or flaking.  Redness.  Cracks.  Itching.  Pain or a burning sensation.  Blisters. With allergic contact dermatitis, there may also be swelling in areas such as the eyelids, mouth, or genitals.  DIAGNOSIS  Your caregiver can usually  tell what the problem is by doing a physical exam. In cases where the cause is uncertain and an allergic contact dermatitis is suspected, a patch skin test may be performed to help determine the cause of your  dermatitis. TREATMENT Treatment includes protecting the skin from further contact with the irritating substance by avoiding that substance if possible. Barrier creams, powders, and gloves may be helpful. Your caregiver may also recommend:  Steroid creams or ointments applied 2 times daily. For best results, soak the rash area in cool water for 20 minutes. Then apply the medicine. Cover the area with a plastic wrap. You can store the steroid cream in the refrigerator for a "chilly" effect on your rash. That may decrease itching. Oral steroid medicines may be needed in more severe cases.  Antibiotics or antibacterial ointments if a skin infection is present.  Antihistamine lotion or an antihistamine taken by mouth to ease itching.  Lubricants to keep moisture in your skin.  Burow's solution to reduce redness and soreness or to dry a weeping rash. Mix one packet or tablet of solution in 2 cups cool water. Dip a clean washcloth in the mixture, wring it out a bit, and put it on the affected area. Leave the cloth in place for 30 minutes. Do this as often as possible throughout the day.  Taking several cornstarch or baking soda baths daily if the area is too large to cover with a washcloth. Harsh chemicals, such as alkalis or acids, can cause skin damage that is like a burn. You should flush your skin for 15 to 20 minutes with cold water after such an exposure. You should also seek immediate medical care after exposure. Bandages (dressings), antibiotics, and pain medicine may be needed for severely irritated skin.  HOME CARE INSTRUCTIONS  Avoid the substance that caused your reaction.  Keep the area of skin that is affected away from hot water, soap, sunlight, chemicals, acidic substances, or anything else that would irritate your skin.  Do not scratch the rash. Scratching may cause the rash to become infected.  You may take cool baths to help stop the itching.  Only take over-the-counter or  prescription medicines as directed by your caregiver.  See your caregiver for follow-up care as directed to make sure your skin is healing properly. SEEK MEDICAL CARE IF:   Your condition is not better after 3 days of treatment.  You seem to be getting worse.  You see signs of infection such as swelling, tenderness, redness, soreness, or warmth in the affected area.  You have any problems related to your medicines. Document Released: 01/11/2000 Document Revised: 04/07/2011 Document Reviewed: 06/18/2010 [facility] Patient Information 2015 [place], [place]. This information is not intended to replace advice given to you by your health care provider. Make sure you discuss any questions you have with your health care provider.

## 2014-07-23 NOTE — Progress Notes (Signed)
Subjective:  This chart was scribed for Meredith Staggers, MD by [facility], medical scribe at [facility].The patient was seen in exam room 04 and the patient's care was started at 2:13 PM.   Patient ID: Emily [name], female    DOB: [birth year], 34 y.o.   MRN: [id number] Chief Complaint  Patient presents with  . Rash    X 5 days, bilateral arms   HPI HPI Comments: Emily [name] is a 34 y.o. female who presents to [facility] complaining of rash located on bilateral arm, onset five days ago. Started as a small spot on her arm and has gradually worsened since. The rash does itch and burns. It is worse when she gets out of the shower. She has tried benadryl cream, pills and lotions for relief. The benadryl cream helps with the itching. No changes in dermatologic products. She uses  a self tanner but has used this in the past, last use was Wednesday. Pt has not worked out in the yard recently. She denies shortness of breath, trouble swallowing, and scratchy throat.   Patient Active Problem List   Diagnosis Date Noted  . Cardiac murmur 05/10/2014  . Allergic rhinitis 05/10/2014  . 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 Interstitial cystitis 04/25/2014   Past Medical History  Diagnosis Date  . Depression   . Mitral valve prolapse   . IBS (irritable bowel syndrome)   . MRSA (methicillin resistant Staphylococcus aureus)     infection on LT side of face, and forehead (02/2011)  . Nephrolithiasis   . UTI (urinary tract infection)   . Heart murmur   . GERD (gastroesophageal reflux disease)   . Abnormal Pap smear of cervix     2014 ASCUS HPV HR+, 2015 LSIL HPV HR+   Past Surgical History  Procedure Laterality Date  . Tubal ligation  2011  . Wisdom tooth extraction  2003  . Esophagogastroduodenoscopy  03/25/2011    Procedure: ESOPHAGOGASTRODUODENOSCOPY (EGD);  Surgeon: Barrie Folk, MD;  Location: [facility] ENDOSCOPY;  Service: Endoscopy;  Laterality: N/A;  .  Savory dilation  03/25/2011    Procedure: SAVORY DILATION;  Surgeon: Barrie Folk, MD;  Location: [facility] ENDOSCOPY;  Service: Endoscopy;  Laterality: N/A;  . Cesarean section  2007 & 2011    x2  . Colposcopy  2014    neg   Allergies  Allergen Reactions  . Penicillins     Her mom unsure if her or her sister is allergic, but patient has had medications in the PCN family without any problem   . Quinolones     Rash   . Sulfa Antibiotics Swelling    "swelling of tongue, hives"   Prior to Admission medications   Medication Sig Start Date End Date Taking? Authorizing Provider  EPINEPHrine (EPI-PEN) 0.3 mg/0.3 mL DEVI Inject 0.3 mLs (0.3 mg total) into the muscle once. 02/15/12  Yes Collene Gobble, MD  levonorgestrel (MIRENA) 20 MCG/24HR IUD 1 each by Intrauterine route once.   Yes Historical Provider, MD  loratadine (CLARITIN) 10 MG tablet Take 10 mg by mouth daily.   Yes Historical Provider, MD  Multiple Vitamins-Minerals (MULTIVITAMIN PO) Take by mouth daily.   Yes Historical Provider, MD  valACYclovir (VALTREX) 500 MG tablet Take 1 tablet (500 mg total) by mouth daily. 01/06/14  Yes Verner Chol, CNM   History   Social History  . Marital Status: Divorced    Spouse Name: N/A  .  Number of Children: N/A  . Years of Education: N/A   Occupational History  . Not on file.   Social History Main Topics  . Smoking status: Never Smoker   . Smokeless tobacco: Not on file  . Alcohol Use: No  . Drug Use: No  . Sexual Activity:    Partners: Male    Birth Control/ Protection: IUD     Comment: mirena   Other Topics Concern  . Not on file   Social History Narrative   Review of Systems  HENT: Negative for sore throat.   Respiratory: Negative for shortness of breath.   Skin: Positive for rash.      Objective:  BP 100/70 mmHg  Pulse 75  Temp(Src) 98.7 F (37.1 C) (Oral)  Resp 16  Ht 5' 5.25" (1.657 m)  Wt 141 lb (63.957 kg)  BMI 23.29 kg/m2  SpO2 97% Physical Exam    Constitutional: She is oriented to person, place, and time. She appears well-developed and well-nourished. No distress.  HENT:  Head: Normocephalic and atraumatic.  Eyes: Pupils are equal, round, and reactive to light.  Neck: Normal range of motion.  Cardiovascular: Normal rate and regular rhythm.   Pulmonary/Chest: Effort normal. No respiratory distress.  Musculoskeletal: Normal range of motion.  Neurological: She is alert and oriented to person, place, and time.  Skin: Skin is warm and dry.  Fine macular confluent rash over the dorsal and volar forearms, and the elbows up the arms into raised patches. Healing bruise on the right upper arm.  Psychiatric: She has a normal mood and affect. Her behavior is normal.  Nursing note and vitals reviewed.    Assessment & Plan:   Emily [name] is a 34 y.o. female Contact dermatitis - Plan: predniSONE (DELTASONE) 20 MG tablet, DISCONTINUED: predniSONE (DELTASONE) 20 MG tablet  Rash and nonspecific skin eruption - Plan: predniSONE (DELTASONE) 20 MG tablet, DISCONTINUED: predniSONE (DELTASONE) 20 MG tablet Contact derm/urticaria vs. photodermatitis based on location, but spreading up arms, in spite of daily claritin. Options discussed, but will try prednisone 40mg  QD for 3 days, topical cortisone if needed for next day or two, claritin or benadryl po as needed for itching. Avoid sun exposure to affected areas next 4-5 days, and return if not improving or recurs once return to sun exposure. Sooner if worse.     Meds ordered this encounter  Medications  . DISCONTD: predniSONE (DELTASONE) 20 MG tablet    Sig: Take 2 tablets (40 mg total) by mouth daily with breakfast.    Dispense:  6 tablet    Refill:  0  . predniSONE (DELTASONE) 20 MG tablet    Sig: Take 2 tablets (40 mg total) by mouth daily with breakfast.    Dispense:  6 tablet    Refill:  0   Patient Instructions  Your rash appears to be either a contact dermatitis, allergic reaction and  hives, or possible photodermatitis. Avoid sun to these areas next 4-5 days, start prednisone as discussed, continue claritin once per day or benadryl over the counter every 4-6 hours, and cortisone cream to itching areas today and tomorrow if needed. If rash not improving in next 4-5 days, or worsening sooner - return for recheck.  Return to the clinic or go to the nearest emergency room if any of your symptoms worsen or new symptoms occur.  Rash A rash is a change in the color or texture of your skin. There are many different types of rashes. You  may have other problems that accompany your rash. CAUSES   Infections.  Allergic reactions. This can include allergies to pets or foods.  Certain medicines.  Exposure to certain chemicals, soaps, or cosmetics.  Heat.  Exposure to poisonous plants.  Tumors, both cancerous and noncancerous. SYMPTOMS   Redness.  Scaly skin.  Itchy skin.  Dry or cracked skin.  Bumps.  Blisters.  Pain. DIAGNOSIS  Your caregiver may do a physical exam to determine what type of rash you have. A skin sample (biopsy) may be taken and examined under a microscope. TREATMENT  Treatment depends on the type of rash you have. Your caregiver may prescribe certain medicines. For serious conditions, you may need to see a skin doctor (dermatologist). HOME CARE INSTRUCTIONS   Avoid the substance that caused your rash.  Do not scratch your rash. This can cause infection.  You may take cool baths to help stop itching.  Only take over-the-counter or prescription medicines as directed by your caregiver.  Keep all follow-up appointments as directed by your caregiver. SEEK IMMEDIATE MEDICAL CARE IF:  You have increasing pain, swelling, or redness.  You have a fever.  You have new or severe symptoms.  You have body aches, diarrhea, or vomiting.  Your rash is not better after 3 days. MAKE SURE YOU:  Understand these instructions.  Will watch your  condition.  Will get help right away if you are not doing well or get worse. Document Released: 01/03/2002 Document Revised: 04/07/2011 Document Reviewed: 10/28/2010 [facility] Patient Information 2015 [place], [place]. This information is not intended to replace advice given to you by your health care provider. Make sure you discuss any questions you have with your health care provider.   Contact Dermatitis Contact dermatitis is a reaction to certain substances that touch the skin. Contact dermatitis can be either irritant contact dermatitis or allergic contact dermatitis. Irritant contact dermatitis does not require previous exposure to the substance for a reaction to occur.Allergic contact dermatitis only occurs if you have been exposed to the substance before. Upon a repeat exposure, your body reacts to the substance.  CAUSES  Many substances can cause contact dermatitis. Irritant dermatitis is most commonly caused by repeated exposure to mildly irritating substances, such as:  Makeup.  Soaps.  Detergents.  Bleaches.  Acids.  Metal salts, such as nickel. Allergic contact dermatitis is most commonly caused by exposure to:  Poisonous plants.  Chemicals (deodorants, shampoos).  Jewelry.  Latex.  Neomycin in triple antibiotic cream.  Preservatives in products, including clothing. SYMPTOMS  The area of skin that is exposed may develop:  Dryness or flaking.  Redness.  Cracks.  Itching.  Pain or a burning sensation.  Blisters. With allergic contact dermatitis, there may also be swelling in areas such as the eyelids, mouth, or genitals.  DIAGNOSIS  Your caregiver can usually tell what the problem is by doing a physical exam. In cases where the cause is uncertain and an allergic contact dermatitis is suspected, a patch skin test may be performed to help determine the cause of your dermatitis. TREATMENT Treatment includes protecting the skin from further contact with the  irritating substance by avoiding that substance if possible. Barrier creams, powders, and gloves may be helpful. Your caregiver may also recommend:  Steroid creams or ointments applied 2 times daily. For best results, soak the rash area in cool water for 20 minutes. Then apply the medicine. Cover the area with a plastic wrap. You can store the steroid cream in  the refrigerator for a "chilly" effect on your rash. That may decrease itching. Oral steroid medicines may be needed in more severe cases.  Antibiotics or antibacterial ointments if a skin infection is present.  Antihistamine lotion or an antihistamine taken by mouth to ease itching.  Lubricants to keep moisture in your skin.  Burow's solution to reduce redness and soreness or to dry a weeping rash. Mix one packet or tablet of solution in 2 cups cool water. Dip a clean washcloth in the mixture, wring it out a bit, and put it on the affected area. Leave the cloth in place for 30 minutes. Do this as often as possible throughout the day.  Taking several cornstarch or baking soda baths daily if the area is too large to cover with a washcloth. Harsh chemicals, such as alkalis or acids, can cause skin damage that is like a burn. You should flush your skin for 15 to 20 minutes with cold water after such an exposure. You should also seek immediate medical care after exposure. Bandages (dressings), antibiotics, and pain medicine may be needed for severely irritated skin.  HOME CARE INSTRUCTIONS  Avoid the substance that caused your reaction.  Keep the area of skin that is affected away from hot water, soap, sunlight, chemicals, acidic substances, or anything else that would irritate your skin.  Do not scratch the rash. Scratching may cause the rash to become infected.  You may take cool baths to help stop the itching.  Only take over-the-counter or prescription medicines as directed by your caregiver.  See your caregiver for follow-up care as  directed to make sure your skin is healing properly. SEEK MEDICAL CARE IF:   Your condition is not better after 3 days of treatment.  You seem to be getting worse.  You see signs of infection such as swelling, tenderness, redness, soreness, or warmth in the affected area.  You have any problems related to your medicines. Document Released: 01/11/2000 Document Revised: 04/07/2011 Document Reviewed: 06/18/2010 [facility] Patient Information 2015 [place], [place]. This information is not intended to replace advice given to you by your health care provider. Make sure you discuss any questions you have with your health care provider.     I personally performed the services described in this documentation, which was scribed in my presence. The recorded information has been reviewed and considered, and addended by me as needed.

## 2014-08-29 LAB — BASIC METABOLIC PANEL
BUN: 17 (ref 4–21)
CREATININE: 0.7 (ref 0.5–1.1)
Glucose: 74
POTASSIUM: 4.3 (ref 3.4–5.3)
Sodium: 138 (ref 137–147)

## 2014-08-29 LAB — CBC AND DIFFERENTIAL
HCT: 39 (ref 36–46)
Hemoglobin: 13.1 (ref 12.0–16.0)
PLATELETS: 220 (ref 150–399)
WBC: 7.4

## 2014-08-29 LAB — HEPATIC FUNCTION PANEL
ALK PHOS: 55 (ref 25–125)
ALT: 10 (ref 7–35)
AST: 13 (ref 13–35)
Bilirubin, Total: 0.5

## 2014-11-19 ENCOUNTER — Encounter (HOSPITAL_COMMUNITY): Payer: Self-pay | Admitting: Emergency Medicine

## 2014-11-19 ENCOUNTER — Emergency Department (HOSPITAL_COMMUNITY)
Admission: EM | Admit: 2014-11-19 | Discharge: 2014-11-19 | Disposition: A | Discharge status: Home / Self Care | Payer: 59 | Source: Home / Self Care | Attending: Emergency Medicine | Admitting: Emergency Medicine

## 2014-11-19 DIAGNOSIS — M25562 Pain in left knee: Secondary | ICD-10-CM | POA: Diagnosis not present

## 2014-11-19 MED ORDER — IBUPROFEN 800 MG PO TABS
800.0000 mg | ORAL_TABLET | Freq: Three times a day (TID) | ORAL | Status: DC | PRN
Start: 2014-11-19 — End: 2015-08-03

## 2014-11-19 MED ORDER — LIDOCAINE HCL (PF) 2 % IJ SOLN
INTRAMUSCULAR | Status: AC
  Filled 2014-11-19: qty 2

## 2014-11-19 MED ORDER — METHYLPREDNISOLONE ACETATE 40 MG/ML IJ SUSP
INTRAMUSCULAR | Status: AC
  Filled 2014-11-19: qty 1

## 2014-11-19 MED ORDER — LIDOCAINE HCL (PF) 1 % IJ SOLN
INTRAMUSCULAR | Status: AC
  Filled 2014-11-19: qty 5

## 2014-11-19 NOTE — ED Provider Notes (Signed)
CSN: [id number]     Arrival date & time 11/19/14  1324 History   First MD Initiated Contact with Patient 11/19/14 1444     Chief Complaint  Patient presents with  . Knee Pain   (Consider location/radiation/quality/duration/timing/severity/associated sxs/prior Treatment) HPI She is a 34 year old woman here for evaluation of left knee pain. She states 2 weeks ago she had a twisting injury to the left knee. She states she was in a meeting and her left leg fell asleep. When she stood up her leg twisted out from under her. She reports a little bit of pain and discomfort in the knee for a few days, but it resolved. Then, on Thursday, she developed pain along the medial aspect of the knee. It is worse with lower leg rotation movements. She reports feeling like it catches. She denies any locking. No swelling of the knee. She has not tried any medicines.  Past Medical History  Diagnosis Date  . Depression   . Mitral valve prolapse   . IBS (irritable bowel syndrome)   . MRSA (methicillin resistant Staphylococcus aureus)     infection on LT side of face, and forehead (02/2011)  . Nephrolithiasis   . UTI (urinary tract infection)   . Heart murmur   . GERD (gastroesophageal reflux disease)   . Abnormal Pap smear of cervix     2014 ASCUS HPV HR+, 2015 LSIL HPV HR+   Past Surgical History  Procedure Laterality Date  . Tubal ligation  2011  . Wisdom tooth extraction  2003  . Esophagogastroduodenoscopy  03/25/2011    Procedure: ESOPHAGOGASTRODUODENOSCOPY (EGD);  Surgeon: Barrie Folk, MD;  Location: [facility] ENDOSCOPY;  Service: Endoscopy;  Laterality: N/A;  . Savory dilation  03/25/2011    Procedure: SAVORY DILATION;  Surgeon: Barrie Folk, MD;  Location: [facility] ENDOSCOPY;  Service: Endoscopy;  Laterality: N/A;  . Cesarean section  2007 & 2011    x2  . Colposcopy  2014    neg   Family History  Problem Relation Age of Onset  . Diabetes Mother     Type 2  . Hypertension Mother   . Cancer Maternal  Grandmother   . Breast cancer Maternal Grandmother 50  . Cancer Maternal Grandfather   . Diabetes Maternal Grandfather     Type 1   . Lung cancer Maternal Grandfather   . Heart attack Paternal Grandfather    Social History  Substance Use Topics  . Smoking status: Never Smoker   . Smokeless tobacco: None  . Alcohol Use: No   OB History    Gravida Para Term Preterm AB TAB SAB Ectopic Multiple Living   Review of Systems As in history of present illness Allergies  Penicillins; Quinolones; and Sulfa antibiotics  Home Medications   Prior to Admission medications   Medication Sig Start Date End Date Taking? Authorizing Provider  EPINEPHrine (EPI-PEN) 0.3 mg/0.3 mL DEVI Inject 0.3 mLs (0.3 mg total) into the muscle once. 02/15/12   Collene Gobble, MD  ibuprofen (ADVIL,MOTRIN) 800 MG tablet Take 1 tablet (800 mg total) by mouth every 8 (eight) hours as needed for moderate pain. 11/19/14   Charm Rings, MD  levonorgestrel (MIRENA) 20 MCG/24HR IUD 1 each by Intrauterine route once.    Historical Provider, MD  loratadine (CLARITIN) 10 MG tablet Take 10 mg by mouth daily.    Historical Provider, MD  Multiple Vitamins-Minerals (MULTIVITAMIN PO)  Take by mouth daily.    Historical Provider, MD  predniSONE (DELTASONE) 20 MG tablet Take 2 tablets (40 mg total) by mouth daily with breakfast. 07/23/14   Shade FloodJeffrey R Greene, MD  valACYclovir (VALTREX) 500 MG tablet Take 1 tablet (500 mg total) by mouth daily. 01/06/14   Verner Choleborah S Leonard, CNM   Meds Ordered and Administered this Visit  Medications - No data to display  BP 113/82 mmHg  Pulse 74  Temp(Src) 98.9 F (37.2 C)  Resp 18  SpO2 100% No data found.   Physical Exam  Constitutional: She is oriented to person, place, and time. She appears well-developed and well-nourished. No distress.  Cardiovascular: Normal rate.   Pulmonary/Chest: Effort normal.  Musculoskeletal:  Left knee: No erythema or edema. No appreciable  joint effusion. She is tender along the medial joint line as well as the MCL. She has mild pain with MCL testing, but no laxity. Positive McMurray's. Normal anterior drawer test.  Neurological: She is alert and oriented to person, place, and time.    ED Course  Injection of joint Date/Time: 11/19/2014 3:56 PM Performed by: Charm RingsHONIG, Austin Herd J Authorized by: Charm RingsHONIG, Lillyonna Armstead J Consent: Verbal consent obtained. Risks and benefits: risks, benefits and alternatives were discussed Consent given by: patient Patient understanding: patient states understanding of the procedure being performed Patient identity confirmed: verbally with patient Time out: Immediately prior to procedure a "time out" was called to verify the correct patient, procedure, equipment, support staff and site/side marked as required. Comments: Skin was prepped with alcohol. 40 mg Depo-Medrol with 4 mL of 2% lidocaine was injected into the left knee. Patient tolerated procedure well with no immediate complication.   (including critical care time)  Labs Review Labs Reviewed - No data to display  Imaging Review No results found.    MDM   1. Left medial knee pain    Cortisone injection done today. Recommended ice and ibuprofen over the next 2 days. Knee sleeve given. Follow-up with orthopedics if this becomes a recurrent problem or no improvement in the next week.    Charm RingsErin J Elajah Kunsman, MD 11/19/14 1556

## 2014-11-19 NOTE — Discharge Instructions (Signed)
I think you have a small tear in the meniscus of your knee. We did a cortisone injection today to see if that will help. Please apply ice to your knee today and tomorrow. You can take ibuprofen 800 mg every 8 hours as needed for pain. The cortisone injection takes 24-48 hours to reach full effect. Wear the knee sleeve if you are going to be walking around. If things are not improving in the next week or this becomes an ongoing problem for you, please follow-up with orthopedics.

## 2014-11-19 NOTE — ED Notes (Signed)
Remembers (2 weeks) trying to get up to stand, leg was asleep and twisted knee, some soreness.  Knee seemed fine.  Then Thursday noticed pain in left knee, and its increased in soreness since Thursday.

## 2015-01-08 ENCOUNTER — Ambulatory Visit: Payer: BC Managed Care – PPO | Admitting: Certified Nurse Midwife

## 2015-01-31 ENCOUNTER — Other Ambulatory Visit: Payer: Self-pay | Admitting: Certified Nurse Midwife

## 2015-01-31 NOTE — Telephone Encounter (Signed)
Medication refill request: Valtrex  Last AEX:  01-06-14 Next AEX: not yet scheduled  Last MMG (if hormonal medication request): N/a Refill authorized: please advise

## 2015-04-26 ENCOUNTER — Other Ambulatory Visit: Payer: Self-pay | Admitting: Certified Nurse Midwife

## 2015-04-26 NOTE — Telephone Encounter (Signed)
Medication refill request: Valtrex Last AEX:  01-16-14 Next AEX: not scheduled  Last MMG (if hormonal medication request): N/A Refill authorized: please advise

## 2015-08-03 ENCOUNTER — Encounter: Payer: Self-pay | Admitting: Certified Nurse Midwife

## 2015-08-03 ENCOUNTER — Ambulatory Visit (INDEPENDENT_AMBULATORY_CARE_PROVIDER_SITE_OTHER): Payer: Managed Care, Other (non HMO) | Admitting: Certified Nurse Midwife

## 2015-08-03 VITALS — BP 102/68 | HR 70 | Resp 16 | Ht 64.5 in | Wt 141.0 lb

## 2015-08-03 DIAGNOSIS — N852 Hypertrophy of uterus: Secondary | ICD-10-CM | POA: Diagnosis not present

## 2015-08-03 DIAGNOSIS — Z30432 Encounter for removal of intrauterine contraceptive device: Secondary | ICD-10-CM

## 2015-08-03 DIAGNOSIS — N898 Other specified noninflammatory disorders of vagina: Secondary | ICD-10-CM | POA: Diagnosis not present

## 2015-08-03 DIAGNOSIS — M25559 Pain in unspecified hip: Secondary | ICD-10-CM | POA: Diagnosis not present

## 2015-08-03 DIAGNOSIS — Z01419 Encounter for gynecological examination (general) (routine) without abnormal findings: Secondary | ICD-10-CM | POA: Diagnosis not present

## 2015-08-03 DIAGNOSIS — Z124 Encounter for screening for malignant neoplasm of cervix: Secondary | ICD-10-CM

## 2015-08-03 DIAGNOSIS — Z0000 Encounter for general adult medical examination without abnormal findings: Secondary | ICD-10-CM

## 2015-08-03 LAB — POCT URINALYSIS DIPSTICK
BILIRUBIN UA: NEGATIVE
GLUCOSE UA: NEGATIVE
KETONES UA: NEGATIVE
Leukocytes, UA: NEGATIVE
NITRITE UA: NEGATIVE
PH UA: 5
Protein, UA: NEGATIVE
RBC UA: NEGATIVE
Urobilinogen, UA: NEGATIVE

## 2015-08-03 NOTE — Progress Notes (Signed)
35 y.o. 722P2002 Divorced  Caucasian Fe here for annual exam. Menses none with Mirena IUD, but has noted continued increase discharge. Has had some cramping, hard to determine if uterine or IC. Also has history of kidney stones, but none in " a long time". IUD is due for removal. Has partner that would like to have a family. So considering how to do this. She has had tubal. MIrena IUD was due for removal, 1/16 she thinks,not inserted here. Was inserted after last child who has just turned 8. Sees PCP prn and Urology as needed. No HSV outbreaks, no refill on Valtrex needed. No other health issues today.  No LMP recorded. Patient is not currently having periods (Reason: IUD).          Sexually active: Yes.    The current method of family planning is tubal ligation.    Exercising: Yes.    run Smoker:  no  Health Maintenance: Pap: 01-06-14 ASCUS HPV HR +, colpo 1/16 MMG:  none Colonoscopy:  none BMD:   none TDaP:  unsure Shingles: no Pneumonia: no Hep C and HIV: both neg 2014 Labs: poct urine-neg Self breast exam: done occ   reports that she has never smoked. She does not have any smokeless tobacco history on file. She reports that she does not drink alcohol or use illicit drugs.  Past Medical History  Diagnosis Date  . Depression   . Mitral valve prolapse   . IBS (irritable bowel syndrome)   . MRSA (methicillin resistant Staphylococcus aureus)     infection on LT side of face, and forehead (02/2011)  . Nephrolithiasis   . UTI (urinary tract infection)   . Heart murmur   . GERD (gastroesophageal reflux disease)   . Abnormal Pap smear of cervix     2014 ASCUS HPV HR+, 2015 LSIL HPV HR+    Past Surgical History  Procedure Laterality Date  . Tubal ligation  2011  . Wisdom tooth extraction  2003  . Esophagogastroduodenoscopy  03/25/2011    Procedure: ESOPHAGOGASTRODUODENOSCOPY (EGD);  Surgeon: Barrie FolkJohn C Hayes, MD;  [facility];  Service: Endoscopy;  Laterality: N/A;  . Savory  dilation  03/25/2011    Procedure: SAVORY DILATION;  Surgeon: Barrie FolkJohn C Hayes, MD;  [facility];  Service: Endoscopy;  Laterality: N/A;  . Cesarean section  2007 & 2011    x2  . Colposcopy  [id number],2015    Current Outpatient Prescriptions  Medication Sig Dispense Refill  . EPINEPHrine (EPI-PEN) 0.3 mg/0.3 mL DEVI Inject 0.3 mLs (0.3 mg total) into the muscle once. 1 Device 2  . levonorgestrel (MIRENA) 20 MCG/24HR IUD 1 each by Intrauterine route once.    . Multiple Vitamins-Minerals (MULTIVITAMIN PO) Take by mouth daily.    . valACYclovir (VALTREX) 500 MG tablet TAKE 1 TABLET BY MOUTH DAILY. 30 tablet 3  . loratadine (CLARITIN) 10 MG tablet Take 10 mg by mouth daily.     No current facility-administered medications for this visit.    Family History  Problem Relation Age of Onset  . Diabetes Mother     Type 2  . Hypertension Mother   . Cancer Maternal Grandmother   . Breast cancer Maternal Grandmother 50  . Cancer Maternal Grandfather   . Diabetes Maternal Grandfather     Type 1   . Lung cancer Maternal Grandfather   . Heart attack Paternal Grandfather     ROS:  Pertinent items are noted in HPI.  Otherwise, a comprehensive ROS  was negative.  Exam:   BP 102/68 mmHg  Pulse 70  Resp 16  Ht 5' 4.5" (1.638 m)  Wt 141 lb (63.957 kg)  BMI 23.84 kg/m2 Height: 5' 4.5" (163.8 cm) Ht Readings from Last 3 Encounters:  08/03/15 5' 4.5" (1.638 m)  07/23/14 5' 5.25" (1.657 m)  05/10/14 5\' 4"  (1.626 m)    General appearance: alert, cooperative and appears stated age Head: Normocephalic, without obvious abnormality, atraumatic Neck: no adenopathy, supple, symmetrical, trachea midline and thyroid normal to inspection and palpation Lungs: clear to auscultation bilaterally Breasts: normal appearance, no masses or tenderness, No nipple retraction or dimpling, No nipple discharge or bleeding, No axillary or supraclavicular adenopathy Heart: regular rate and rhythm Abdomen: soft,  non-tender; no masses,  no organomegaly Extremities: extremities normal, atraumatic, no cyanosis or edema Skin: Skin color, texture, turgor normal. No rashes or lesions Lymph nodes: Cervical, supraclavicular, and axillary nodes normal. No abnormal inguinal nodes palpated Neurologic: Grossly normal   Pelvic: External genitalia:  no lesions              Urethra:  normal appearing urethra with no masses, tenderness or lesions              Bartholin's and Skene's: normal                 Vagina: normal appearing vagina with normal color and discharge, no lesions, scant blood noted              Cervix: no cervical motion tenderness, no lesions and normal, IUD string noted in cervix              Pap taken: Yes.   Bimanual Exam:  Uterus:  enlarged, 8-10  weeks size, non tender,  Patient says this is where she noted cramping.              Adnexa: no mass, fullness, tenderness and unable to palpate left well ? uterus or mass               Rectovaginal: Confirms               Anus:  normal sphincter tone, no lesions  Chaperone present: yes  A:  Well Woman with normal exam  Contraception tubal  History of dysmenorrhea with Mirena IUD use, due for removal 1/16  Enlarged uterus with occasional pelvic pain, ? Left adnexal mass  ASCUS pap with HPVHR+ with colpo with LSIL follow up pap today  History of IC with urology management  P:  Reviewed health and wellness pertinent to exam  Discussed if she chose to try for pregnancy would need to see Infertility. Given [facility] information.  Discussed need for IUD removal and will be called with information and scheduled. Will try to do with PUS.  Discussed enlarged uterine finding and need for evaluation with PUS. Questions addressed regarding possible etiology of fibroid, adenomyosis or mass. ? Cause of occasional pelvic pain. Will be called with information and scheduled.  Will need repeat pap in one year if negative. If not per  results.  Continue follow as indicated.  Pap smear as above with HPVHR   counseled on breast self exam, adequate intake of calcium and vitamin D, diet and exercise  return annually or prn  An After Visit Summary was printed and given to the patient.

## 2015-08-03 NOTE — Patient Instructions (Signed)
EXERCISE AND DIET:  We recommended that you start or continue a regular exercise program for good health. Regular exercise means any activity that makes your heart beat faster and makes you sweat.  We recommend exercising at least 30 minutes per day at least 3 days a week, preferably 4 or 5.  We also recommend a diet low in fat and sugar.  Inactivity, poor dietary choices and obesity can cause diabetes, heart attack, stroke, and kidney damage, among others.    ALCOHOL AND SMOKING:  Women should limit their alcohol intake to no more than 7 drinks/beers/glasses of wine (combined, not each!) per week. Moderation of alcohol intake to this level decreases your risk of breast cancer and liver damage. And of course, no recreational drugs are part of a healthy lifestyle.  And absolutely no smoking or even second hand smoke. Most people know smoking can cause heart and lung diseases, but did you know it also contributes to weakening of your bones? Aging of your skin?  Yellowing of your teeth and nails?  CALCIUM AND VITAMIN D:  Adequate intake of calcium and Vitamin D are recommended.  The recommendations for exact amounts of these supplements seem to change often, but generally speaking 600 mg of calcium (either carbonate or citrate) and 800 units of Vitamin D per day seems prudent. Certain women may benefit from higher intake of Vitamin D.  If you are among these women, your doctor will have told you during your visit.    PAP SMEARS:  Pap smears, to check for cervical cancer or precancers,  have traditionally been done yearly, although recent scientific advances have shown that most women can have pap smears less often.  However, every woman still should have a physical exam from her gynecologist every year. It will include a breast check, inspection of the vulva and vagina to check for abnormal growths or skin changes, a visual exam of the cervix, and then an exam to evaluate the size and shape of the uterus and  ovaries.  And after 33 years of age, a rectal exam is indicated to check for rectal cancers. We will also provide age appropriate advice regarding health maintenance, like when you should have certain vaccines, screening for sexually transmitted diseases, bone density testing, colonoscopy, mammograms, etc.   MAMMOGRAMS:  All women over 40 years old should have a yearly mammogram. Many facilities now offer a "3D" mammogram, which may cost around $50 extra out of pocket. If possible,  we recommend you accept the option to have the 3D mammogram performed.  It both reduces the number of women who will be called back for extra views which then turn out to be normal, and it is better than the routine mammogram at detecting truly abnormal areas.    COLONOSCOPY:  Colonoscopy to screen for colon cancer is recommended for all women at age 50.  We know, you hate the idea of the prep.  We agree, BUT, having colon cancer and not knowing it is worse!!  Colon cancer so often starts as a polyp that can be seen and removed at colonscopy, which can quite literally save your life!  And if your first colonoscopy is normal and you have no family history of colon cancer, most women don't have to have it again for 10 years.  Once every ten years, you can do something that may end up saving your life, right?  We will be happy to help you get it scheduled when you are ready.    Be sure to check your insurance coverage so you understand how much it will cost.  It may be covered as a preventative service at no cost, but you should check your particular policy.     Uterine Fibroids Uterine fibroids are tissue masses (tumors) that can develop in the womb (uterus). They are also called leiomyomas. This type of tumor is not cancerous (benign) and does not spread to other parts of the body outside of the pelvic area, which is between the hip bones. Occasionally, fibroids may develop in the fallopian tubes, in the cervix, or on the support  structures (ligaments) that surround the uterus. You can have one or many fibroids. Fibroids can vary in size, weight, and where they grow in the uterus. Some can become quite large. Most fibroids do not require medical treatment. CAUSES A fibroid can develop when a single uterine cell keeps growing (replicating). Most cells in the human body have a control mechanism that keeps them from replicating without control. SIGNS AND SYMPTOMS Symptoms may include:   Heavy bleeding during your period.  Bleeding or spotting between periods.  Pelvic pain and pressure.  Bladder problems, such as needing to urinate more often (urinary frequency) or urgently.  Inability to reproduce offspring (infertility).  Miscarriages. DIAGNOSIS Uterine fibroids are diagnosed through a physical exam. Your health care provider may feel the lumpy tumors during a pelvic exam. Ultrasonography and an MRI may be done to determine the size, location, and number of fibroids. TREATMENT Treatment may include:  Watchful waiting. This involves getting the fibroid checked by your health care provider to see if it grows or shrinks. Follow your health care provider's recommendations for how often to have this checked.  Hormone medicines. These can be taken by mouth or given through an intrauterine device (IUD).  Surgery.  Removing the fibroids (myomectomy) or the uterus (hysterectomy).  Removing blood supply to the fibroids (uterine artery embolization). If fibroids interfere with your fertility and you want to become pregnant, your health care provider may recommend having the fibroids removed.  HOME CARE INSTRUCTIONS  Keep all follow-up visits as directed by your health care provider. This is important.  Take medicines only as directed by your health care provider.  If you were prescribed a hormone treatment, take the hormone medicines exactly as directed.  Do not take aspirin, because it can cause bleeding.  Ask  your health care provider about taking iron pills and increasing the amount of dark green, leafy vegetables in your diet. These actions can help to boost your blood iron levels, which may be affected by heavy menstrual bleeding.  Pay close attention to your period and tell your health care provider about any changes, such as:  Increased blood flow that requires you to use more pads or tampons than usual per month.  A change in the number of days that your period lasts per month.  A change in symptoms that are associated with your period, such as abdominal cramping or back pain. SEEK MEDICAL CARE IF:  You have pelvic pain, back pain, or abdominal cramps that cannot be controlled with medicines.  You have an increase in bleeding between and during periods.  You soak tampons or pads in a half hour or less.  You feel lightheaded, extra tired, or weak. SEEK IMMEDIATE MEDICAL CARE IF:  You faint.  You have a sudden increase in pelvic pain.   This information is not intended to replace advice given to you by your health care   provider. Make sure you discuss any questions you have with your health care provider.   Document Released: 01/11/2000 Document Revised: 02/03/2014 Document Reviewed: 07/12/2013 Elsevier Interactive Patient Education 2016 [employer] Inc[employer]  

## 2015-08-04 LAB — WET PREP BY MOLECULAR PROBE
Candida species: NEGATIVE
Gardnerella vaginalis: NEGATIVE
Trichomonas vaginosis: NEGATIVE

## 2015-08-05 NOTE — Progress Notes (Signed)
Encounter reviewed Jill Jertson, MD   

## 2015-08-08 LAB — IPS PAP TEST WITH HPV

## 2015-08-16 ENCOUNTER — Other Ambulatory Visit: Payer: Managed Care, Other (non HMO)

## 2015-08-16 ENCOUNTER — Other Ambulatory Visit: Payer: Managed Care, Other (non HMO) | Admitting: Obstetrics & Gynecology

## 2015-08-16 ENCOUNTER — Ambulatory Visit (INDEPENDENT_AMBULATORY_CARE_PROVIDER_SITE_OTHER): Payer: Managed Care, Other (non HMO)

## 2015-08-16 ENCOUNTER — Ambulatory Visit (INDEPENDENT_AMBULATORY_CARE_PROVIDER_SITE_OTHER): Payer: Managed Care, Other (non HMO) | Admitting: Obstetrics & Gynecology

## 2015-08-16 DIAGNOSIS — Z30432 Encounter for removal of intrauterine contraceptive device: Secondary | ICD-10-CM | POA: Diagnosis not present

## 2015-08-16 DIAGNOSIS — N852 Hypertrophy of uterus: Secondary | ICD-10-CM | POA: Diagnosis not present

## 2015-08-16 DIAGNOSIS — M25559 Pain in unspecified hip: Secondary | ICD-10-CM

## 2015-08-16 DIAGNOSIS — N949 Unspecified condition associated with female genital organs and menstrual cycle: Secondary | ICD-10-CM | POA: Diagnosis not present

## 2015-08-16 DIAGNOSIS — R102 Pelvic and perineal pain: Secondary | ICD-10-CM

## 2015-08-16 NOTE — Progress Notes (Signed)
35 y.o. G2P2 Divorcedfemale here for a pelvic ultrasound and for removal of IUD.  Pt's IUD is not due out until next year (she doubled checked this and it was placed in 2013) but due to increased vaginal discharge with negative Affirm testing, she desires IUD removal early.    Pt reports increased pelvic cramping after intercourse.  She does have hx of IC and is unsure if this is related.  Reports partner is "larger" than ex-husband and wonders if this is related in any way.  She also wonders if IUD is part of the problem and this, in addition, to vaginal discharge is why she'd like IUD removed.  Affirm testing was negative.  Pt denies STD concerns.  Lastly, partner would like a family.  Pt had BTL with last delivery.  D/w pt tubal reversal vs IVF.  They are still in discussion process and she is not sure if desires REI referral.   Pt did have negative pap with neg HR HVP 08/06/15.  No LMP recorded. Patient is not currently having periods (Reason: IUD).  Sexually active:  yes  Contraception: bilateral tubal ligation  FINDINGS: UTERUS:  8.5 x 5.0 x 4.2cm, no fibroids EMS: 5.283mm with IUD in correct location ADNEXA:  Left ovary 3.1 x 2.0 x 2.0cm with 1.0cm simple follicle   Right ovary 3.2 x 1.8 x 1.6 CUL DE SAC: no free fluid  Confirmed with pt that she does desire IUD removal.  Consent obtained.  Speculum placed.  IUD string noted and grasped with ringed forceps.  IUD removed easily with one pull.  Pt saw IUD before discarging it.  Pt tolerated procedure well and instruments removed without difficulty.  Pt tolerated procedure well.    Discussion:  D/W pt findings.  She does have follow-up with her urologist.  For now, she doesn't want to do anything but will call back for additional evaluation if cramping continues.  Also, pt will call if desires REI referral as well to discuss pregnancy options given BTL hx.  Assessment:  Pelvic cramping in pt with Hx of IC Desirous of IUD removal  Plan: Pt  will call if desires additional follow up or desires referral to REI.  ~15 minutes spent with patient >50% of time was in face to face discussion of above.

## 2015-08-16 NOTE — Addendum Note (Signed)
Addended by: Joeseph AmorFAST, Aleathia Purdy L on: 08/16/2015 08:26 AM   Modules accepted: Orders

## 2015-08-17 ENCOUNTER — Telehealth: Payer: Self-pay | Admitting: *Deleted

## 2015-08-18 ENCOUNTER — Encounter: Payer: Self-pay | Admitting: Obstetrics & Gynecology

## 2015-09-03 NOTE — Telephone Encounter (Signed)
Erroneus encounter

## 2015-09-12 ENCOUNTER — Encounter: Payer: Self-pay | Admitting: Obstetrics and Gynecology

## 2015-09-12 ENCOUNTER — Ambulatory Visit (INDEPENDENT_AMBULATORY_CARE_PROVIDER_SITE_OTHER): Payer: Managed Care, Other (non HMO) | Admitting: Obstetrics and Gynecology

## 2015-09-12 ENCOUNTER — Telehealth: Payer: Self-pay | Admitting: Certified Nurse Midwife

## 2015-09-12 VITALS — BP 104/72 | HR 66 | Ht 64.5 in | Wt 142.0 lb

## 2015-09-12 DIAGNOSIS — N939 Abnormal uterine and vaginal bleeding, unspecified: Secondary | ICD-10-CM

## 2015-09-12 LAB — CBC
HEMATOCRIT: 39.4 % (ref 35.0–45.0)
HEMOGLOBIN: 13 g/dL (ref 11.7–15.5)
MCH: 28.6 pg (ref 27.0–33.0)
MCHC: 33 g/dL (ref 32.0–36.0)
MCV: 86.8 fL (ref 80.0–100.0)
MPV: 9.9 fL (ref 7.5–12.5)
Platelets: 228 10*3/uL (ref 140–400)
RBC: 4.54 MIL/uL (ref 3.80–5.10)
RDW: 12.8 % (ref 11.0–15.0)
WBC: 6.8 10*3/uL (ref 3.8–10.8)

## 2015-09-12 LAB — POCT URINE PREGNANCY: Preg Test, Ur: NEGATIVE

## 2015-09-12 MED ORDER — NORETHIN-ETH ESTRAD-FE BIPHAS 1 MG-10 MCG / 10 MCG PO TABS: 1.0000 | ORAL_TABLET | Freq: Every day | ORAL | 2 refills | Status: DC

## 2015-09-12 NOTE — Telephone Encounter (Signed)
Patient states she had an IUD removed on 07/20. She has been having extremely heavy bleeding since then. She states she has clots the size of a soft ball.

## 2015-09-12 NOTE — Telephone Encounter (Signed)
Spoke with patient. Patient had her IUD removed on 08/16/2015 with ultrasound guidance by Dr.Miller. Reports since IUD removal she has been having irregular bleeding. Right after removal had bleeding that was light and would last for 2 days then have one day with no bleeding this occurred for 8-9 days. For the last 10 days she reports having heavy bleeding. "I am bleeding like I just had a baby." Reports she is changing her pad every 30 minutes due to filling the pad. "If I waited to change it every hour I would bleed through it." Reports passing clots that have been the size of a golf ball or softball. Feels exhausted. Denies SOB, dizziness, or feeling light headed. Advised she will need to be seen in the office for immediate evaluation. Patient states that she cannot come in until around 12 today in order to find someone to watch her children. Appointment scheduled for today at 12:45 pm with Dr.Silva. Advised if bleeding continues to increase, develops SOB, light headedness, or dizziness she will need to be seen earlier in the office or with the ER. She verbalizes understanding.  Routing to provider for final review. Patient agreeable to disposition. Will close encounter.

## 2015-09-12 NOTE — Progress Notes (Signed)
GYNECOLOGY  VISIT   HPI: 35 y.o.   Divorced  Caucasian  female   G2P2002 with Patient's last menstrual period was 08/16/2015 (exact date).   here for heavy vaginal bleeding for 10 days with severe abdominal cramping and passing clots larger than a 50 cent piece.    IUD removed on 7/20 and had ultrasound same day, and has had bleeding off and on since then.  Reports bleeding with clots the size of softball and golf ball. Up last night twenty times with bleeding and heavy cramping and low back pain.  Bleeding was either heavy or light prior to having the heavy clotting.  Thinks she even passed pink tissue. Feels that the bleeding has slowed now.  Reports headache.  States she feels tired but not dizzy.  Pelvic ultrasound was normal showing no fibroids, EMS 5.3 mm and normal ovaries with a 1 cm left ovarian follicle.   IUD was removed due to vaginal discharge and pelvic pain and cramping following intercourse.  Had negative Affirm.   IUD was placed originally for heavy cycles.   Had anpther IUD in the past and this was removed due to pain also 8 years ago.   Used oral birth control in the past and did not do well with them 10 years ago.  Felt moody and emotional swings.  Did not do well with the OrthoEvra patch.   Considering future childbearing.   UPT: neg. Hgb 12.9.  GYNECOLOGIC HISTORY: Patient's last menstrual period was 08/16/2015 (exact date). Contraception:  Tubal Menopausal hormone therapy:  none Last mammogram:  n/a Last pap smear:   08-06-15 Neg:Neg HR HPV        OB History    Gravida Para Term Preterm AB Living   2 2 2     2    SAB TAB Ectopic Multiple Live Births           1         Patient Active Problem List   Diagnosis Date Noted  . Cardiac murmur 05/10/2014  . Allergic rhinitis 05/10/2014  . 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 Interstitial cystitis 04/25/2014    Past Medical History:  Diagnosis Date  . Abnormal Pap smear of cervix    2014 ASCUS HPV HR+,  2015 LSIL HPV HR+  . Depression   . GERD (gastroesophageal reflux disease)   . Heart murmur   . IBS (irritable bowel syndrome)   . Mitral valve prolapse   . MRSA (methicillin resistant Staphylococcus aureus)    infection on LT side of face, and forehead (02/2011)  . Nephrolithiasis   . UTI (urinary tract infection)     Past Surgical History:  Procedure Laterality Date  . CESAREAN SECTION  2007 & 2011   x2  . COLPOSCOPY  [id number]  . ESOPHAGOGASTRODUODENOSCOPY  03/25/2011   Procedure: ESOPHAGOGASTRODUODENOSCOPY (EGD);  Surgeon: Barrie FolkJohn C Hayes, MD;  [facility];  Service: Endoscopy;  Laterality: N/A;  . SAVORY DILATION  03/25/2011   Procedure: SAVORY DILATION;  Surgeon: Barrie FolkJohn C Hayes, MD;  [facility];  Service: Endoscopy;  Laterality: N/A;  . TUBAL LIGATION  2011  . WISDOM TOOTH EXTRACTION  2003    Current Outpatient Prescriptions  Medication Sig Dispense Refill  . EPINEPHrine (EPI-PEN) 0.3 mg/0.3 mL DEVI Inject 0.3 mLs (0.3 mg total) into the muscle once. 1 Device 2  . loratadine (CLARITIN) 10 MG tablet Take 10 mg by mouth daily.    . Multiple Vitamins-Minerals (MULTIVITAMIN PO) Take by mouth daily.    .Marland Kitchen  valACYclovir (VALTREX) 500 MG tablet TAKE 1 TABLET BY MOUTH DAILY. 30 tablet 3   No current facility-administered medications for this visit.      ALLERGIES: Penicillins; Quinolones; and Sulfa antibiotics  Family History  Problem Relation Age of Onset  . Diabetes Mother     Type 2  . Hypertension Mother   . Cancer Maternal Grandmother   . Breast cancer Maternal Grandmother 50  . Cancer Maternal Grandfather   . Diabetes Maternal Grandfather     Type 1   . Lung cancer Maternal Grandfather   . Heart attack Paternal Grandfather     Social History   Social History  . Marital status: Single    Spouse name: N/A  . Number of children: N/A  . Years of education: N/A   Occupational History  . Not on file.   Social History Main Topics  . Smoking  status: Never Smoker  . Smokeless tobacco: Never Used  . Alcohol use No     Comment: less than 1 a month   . Drug use: No  . Sexual activity: Yes    Partners: Male    Birth control/ protection: IUD, Surgical     Comment: mirena   Other Topics Concern  . Not on file   Social History Narrative  . No narrative on file    ROS:  Pertinent items are noted in HPI.  PHYSICAL EXAMINATION:    BP 104/72 (BP Location: Right Arm, Patient Position: Sitting, Cuff Size: Normal)   Pulse 66   Ht 5' 4.5" (1.638 m)   Wt 142 lb (64.4 kg)   LMP 08/16/2015 (Exact Date)   BMI 24.00 kg/m     General appearance: alert, cooperative and appears stated age   Abdomen: soft, non-tender, no masses,  no organomegaly   Pelvic: External genitalia:  no lesions.  Blood stained perineum.              Urethra:  normal appearing urethra with no masses, tenderness or lesions              Bartholins and Skenes: normal                 Vagina: normal appearing vagina with normal color and discharge, no lesions              Cervix: no lesions.  Blood staining in the vagina.  No active bleeding.                 Bimanual Exam:  Uterus:  normal size, contour, position, consistency, mobility, non-tender              Adnexa: no mass, fullness, tenderness           Chaperone was present for exam.  ASSESSMENT  Menorrhagia following Mirena IUD removal.  Probably passed endometrial case.  Status post BTL.  UPT negative.  Normal Hgb.  PLAN  Discussed above.  Will do CBC today.  Will do a short course of LoLoestin for 3 months. Patient is aware that this is a combined oral contraceptive and a very low dose of estrogen.  Call for prolonged or irregular bleeding or intolerance of OCPs.  Follow up in 6 weeks.    An After Visit Summary was printed and given to the patient.  __15____ minutes face to face time of which over 50% was spent in counseling.

## 2015-09-13 LAB — HEMOGLOBIN, FINGERSTICK: HEMOGLOBIN, FINGERSTICK: 12.9 g/dL (ref 12.0–16.0)

## 2015-09-18 ENCOUNTER — Encounter: Payer: Self-pay | Admitting: Pediatrics

## 2015-09-22 ENCOUNTER — Emergency Department (HOSPITAL_COMMUNITY): Admission: EM | Admit: 2015-09-22 | Discharge: 2015-09-22 | Discharge status: Against Medical Advice | Payer: Self-pay

## 2015-09-24 ENCOUNTER — Encounter: Payer: Self-pay | Admitting: Pediatrics

## 2015-10-04 ENCOUNTER — Telehealth: Payer: Self-pay | Admitting: Obstetrics and Gynecology

## 2015-10-04 NOTE — Telephone Encounter (Signed)
It may be helpful for the patient to return to discuss options further.   She has several choices still - progesterone only OCPs, Lysteda, endometrial ablation, hysterectomy.  I am happy to see her to discuss these further.

## 2015-10-04 NOTE — Telephone Encounter (Signed)
Message left to return call to Triage Nurse at [phone].    

## 2015-10-04 NOTE — Telephone Encounter (Signed)
Patient returned call. Patient states she was started on lo loestrin back in August for heavy vaginal bleeding. Patient states she has always had heavy periods and had an IUD for years to help control the bleeding. Patient states the IUD was removed in July. Patient states that while taking the lo loestrin,  "I kept a headache and was taking ibuprofen every 3-4 hours without relief." Patient states she decided to stop the lo loestrin because she thought that might be the cause of her headaches. She states she stopped the pills on 10/02/15 and that she took all the pills but "the brown ones." No headaches or bleeding since she stopped. Patient calling today to see what other alternatives she has. She states she plans to start maca root on Friday because she knows the hormones will be out of her system then. RN advised this message would be sent to Dr. Edward JollySilva for review and our office would call with any additional recommendations. Patient agreeable.   Routing to provider for review.

## 2015-10-04 NOTE — Telephone Encounter (Signed)
Patient has stopped the hormone medication because it was giving her a very bad headache. Says she is not bleeding right now and would like to speak with nurse.

## 2015-10-05 NOTE — Telephone Encounter (Signed)
Spoke with patient. Advised of message as seen below from Dr.Silva. Patient states that since she stopped the Lo Loestrin she is feeling much better and is not currently having any bleeding. Reports while taking the Lo Loestrin she was having headaches and thoughts of harming herself. Denies headaches or thoughts of harming herself at this time. Reports she now feels stable after stopping Lo Loestrin. Advised if feelings of wanting to harm herself return she will need to seek care immediately and can contact the suicide hotline if she ever needs someone to speak with. "I do not feel like I am going to harm myself any more. I just do not do well with taking birth control pills." Patient declines OV with Dr.Silva as she is not interested in alternative options at this time and would like to monitor for any future bleeding. Advised if she starts her menses and has heavy or prolonged bleeding she will need to be seen in the office for evaluation. She is agreeable. Patient has a follow up appointment with Dr.Silva scheduled on 10/24/2015 and plans to keep this appointment as scheduled.  Routing to provider for final review. Patient agreeable to disposition. Will close encounter.

## 2015-10-24 ENCOUNTER — Ambulatory Visit (INDEPENDENT_AMBULATORY_CARE_PROVIDER_SITE_OTHER): Payer: Managed Care, Other (non HMO) | Admitting: Obstetrics and Gynecology

## 2015-10-24 ENCOUNTER — Encounter: Payer: Self-pay | Admitting: Obstetrics and Gynecology

## 2015-10-24 VITALS — BP 110/64 | HR 70 | Ht 64.5 in | Wt 145.6 lb

## 2015-10-24 DIAGNOSIS — Z3049 Encounter for surveillance of other contraceptives: Secondary | ICD-10-CM | POA: Diagnosis not present

## 2015-10-24 DIAGNOSIS — N912 Amenorrhea, unspecified: Secondary | ICD-10-CM | POA: Diagnosis not present

## 2015-10-24 DIAGNOSIS — N943 Premenstrual tension syndrome: Secondary | ICD-10-CM

## 2015-10-24 LAB — POCT URINE PREGNANCY: Preg Test, Ur: NEGATIVE

## 2015-10-24 NOTE — Progress Notes (Signed)
GYNECOLOGY  VISIT   HPI: 35 y.o.   Single  Caucasian  female   G2P2002 with Patient's last menstrual period was 08/16/2015 (exact date).   here for follow up. Patient could not tolerate LoLoestrin.  She states it made her feel depressed/almost suicidal and she stopped during the pack of pills. She hasn't had a period since 08-16-15. Status post BTL.  Started OCPs on 09/13/15. "It was awful." Patient felt suicidal and stopped them about Sept. 1. Now off OCPs for 1.5 weeks and no cycle yet.  Feels back to normal and now taking macaroot. Now having bloating two days and and feeling crampy.  No bleeding yet.  Stated her more difficult time if from ovulation until her cycle begins again.   States she would like the possibility of future childbearing even through she has had a tubal ligation.   Used Wellbutrin in the past for postpartum depression.  Did well with this.  Took another medication ? For 6 weeks and had weight gain.  Very concerned about weight gain.  States she does have anxiety.   IUD removed in July due to discharge, pelvic pain, and cramping following intercourse. Placed originally for heavy menses.   Prior ultrasound showing no fibroids, EMS 5.3 mm, and normal ovaries with a 1 cm left ovarian follicle.  Strained an ACL so limited physical activity.  Seeing ortho about this.   UPT: Negative  GYNECOLOGIC HISTORY: Patient's last menstrual period was 08/16/2015 (exact date). Contraception:  Tubal Menopausal hormone therapy:  n/a Last mammogram:  n/a Last pap smear:   08-06-15 Neg:Neg HR HPV        OB History    Gravida Para Term Preterm AB Living   2 2 2     2    SAB TAB Ectopic Multiple Live Births           1         Patient Active Problem List   Diagnosis Date Noted  . Cardiac murmur 05/10/2014  . Allergic rhinitis 05/10/2014  . 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 Interstitial cystitis 04/25/2014    Past Medical History:  Diagnosis Date  . Abnormal Pap smear  of cervix    2014 ASCUS HPV HR+, 2015 LSIL HPV HR+  . Depression   . GERD (gastroesophageal reflux disease)   . Heart murmur   . IBS (irritable bowel syndrome)   . Mitral valve prolapse   . MRSA (methicillin resistant Staphylococcus aureus)    infection on LT side of face, and forehead (02/2011)  . Nephrolithiasis   . UTI (urinary tract infection)     Past Surgical History:  Procedure Laterality Date  . CESAREAN SECTION  2007 & 2011   x2  . COLPOSCOPY  [id number]  . ESOPHAGOGASTRODUODENOSCOPY  03/25/2011   Procedure: ESOPHAGOGASTRODUODENOSCOPY (EGD);  Surgeon: Barrie Folk, MD;  Location: [facility] ENDOSCOPY;  Service: Endoscopy;  Laterality: N/A;  . SAVORY DILATION  03/25/2011   Procedure: SAVORY DILATION;  Surgeon: Barrie Folk, MD;  Location: [facility] ENDOSCOPY;  Service: Endoscopy;  Laterality: N/A;  . TUBAL LIGATION  2011  . WISDOM TOOTH EXTRACTION  2003    Current Outpatient Prescriptions  Medication Sig Dispense Refill  . EPINEPHrine (EPI-PEN) 0.3 mg/0.3 mL DEVI Inject 0.3 mLs (0.3 mg total) into the muscle once. 1 Device 2  . loratadine (CLARITIN) 10 MG tablet Take 10 mg by mouth daily.    . Multiple Vitamins-Minerals (MULTIVITAMIN PO) Take by mouth daily.    . valACYclovir (VALTREX)  500 MG tablet TAKE 1 TABLET BY MOUTH DAILY. 30 tablet 3   No current facility-administered medications for this visit.      ALLERGIES: Other; Penicillins; Quinolones; and Sulfa antibiotics  Family History  Problem Relation Age of Onset  . Diabetes Mother     Type 2  . Hypertension Mother   . Cancer Maternal Grandmother   . Breast cancer Maternal Grandmother 50  . Cancer Maternal Grandfather   . Diabetes Maternal Grandfather     Type 1   . Lung cancer Maternal Grandfather   . Heart attack Paternal Grandfather     Social History   Social History  . Marital status: Single    Spouse name: N/A  . Number of children: N/A  . Years of education: N/A   Occupational History  . Not on file.    Social History Main Topics  . Smoking status: Never Smoker  . Smokeless tobacco: Never Used  . Alcohol use No     Comment: less than 1 a month   . Drug use: No  . Sexual activity: Yes    Partners: Male    Birth control/ protection: Surgical     Comment: Tubal   Other Topics Concern  . Not on file   Social History Narrative  . No narrative on file    ROS:  Pertinent items are noted in HPI.  PHYSICAL EXAMINATION:    BP 110/64 (BP Location: Right Arm, Patient Position: Sitting, Cuff Size: Normal)   Pulse 70   Ht 5' 4.5" (1.638 m)   Wt 145 lb 9.6 oz (66 kg)   LMP 08/16/2015 (Exact Date)   BMI 24.61 kg/m     General appearance: alert, cooperative and appears stated age  ASSESSMENT  Status post BTL. Skipped menses.  Just stopped OCPs.  Depression and suicidal ideation on combined OCPs.  PMS.  Hx menorrhagia.  PLAN  UPT now.  Negative.  Avoid combined OCPs at this time.  Discussed Lysteda for menorrhagia if needed. Told she may take Ibuprofen 600 - 800 mg po every 6  - 8 hours respectively to reduce flow during her cycle. Discussed SSRIs for PMS.  Would consider Prozac or Zoloft if desires this.  Patient will do her own research.  Increase exercise.  Follow up prn.    An After Visit Summary was printed and given to the patient.  _25_____ minutes face to face time of which over 50% was spent in counseling.

## 2015-10-24 NOTE — Patient Instructions (Signed)
Premenstrual Syndrome Premenstrual syndrome (PMS) is a condition that consists of physical, emotional, and behavioral symptoms that affect women of childbearing age. PMS occurs 5-14 days before the start of a menstrual period and often recurs in a predictable pattern. The symptoms go away a few days after the menstrual period starts. PMS can interfere in many ways with normal daily activities and can range from mild to severe. When PMS is considered severe, it Hornik be diagnosed as premenstrual dysphoric disorder (PMDD). A small percentage of women are affected by PMS symptoms and an even smaller percentage of those women are affected by PMDD.  CAUSES  The exact cause of PMS is unknown, but it seems to be related to cyclic hormone changes that happen before menstruation. These hormones are thought to affect chemicals in the brain (serotonin) that can influence a person's mood.  SYMPTOMS  Symptoms of PMS recur consistently from month to month and go away completely after the menstrual period starts. The most common emotional or behavioral symptom is mood swings. These mood swings can be disabling and interfere with normal activities of daily living. Other common symptoms include depression and angry outbursts. Other symptoms Ishler include:   Irritability.  Anxiety.  Crying spells.   Food cravings or appetite changes.   Changes in sexual desire.   Confusion.   Aggression.   Social withdrawal.   Poor concentration. The most common physical symptoms include a sense of bloating, breast pain, headaches, and extreme fatigue. Other physical symptoms include:   Backaches.   Swelling of the hands and feet.   Weight gain.   Hot flashes.  DIAGNOSIS  To make a diagnosis, your caregiver will ask questions to confirm that you are having a pattern of symptoms. Symptoms must:   Be present 5 days before the start of your period and be present at least 3 months in a row.   End within 4 days  after your period starts.   Interfere with some of your normal activities.  Other conditions, such as thyroid disease, depression, and migraine headaches must be ruled out before a diagnosis of PMS is confirmed.  TREATMENT  Your caregiver Mungo suggest ways to maintain a healthy lifestyle, such as exercise. Over-the-counter pain relievers Blower ease cramps, aches, pains, headaches, and breast tenderness. However, selective serotonin reuptake inhibitors (SSRIs) are medicines that are most beneficial in improving PMS if taken in the second half of the monthly cycle. They Janus be taken on a daily basis. The most effective oral contraceptive pill used for symptoms of PMS is one that contains the ingredient drospirenone. Taking 4 days off of the pill instead of the usual 7 days also has shown to increase effectiveness.  There are a number of drugs, dietary supplements, vitamins, and water pills (diuretics) which have been suggested to be helpful but have not shown to be of any benefit to improving PMS symptoms.  HOME CARE INSTRUCTIONS   For 2-3 months, write down your symptoms, their severity, and how long they last. This Thornell help your caregiver prescribe the best treatment for your symptoms.  Exercise regularly as suggested by your caregiver.  Eat a regular, well-balanced diet.  Avoid caffeine, alcohol, and tobacco consumption.  Limit salt and salty foods to lessen bloating and fluid retention.  Get enough sleep. Practice relaxation techniques.  Drink enough fluids to keep your urine clear or pale yellow.  Take medicines as directed by your caregiver.  Limit stress.  Take a multivitamin as directed by your   caregiver.   This information is not intended to replace advice given to you by your health care provider. Make sure you discuss any questions you have with your health care provider.   Document Released: 01/11/2000 Document Revised: 10/08/2011 Document Reviewed: 06/02/2011 Elsevier  Interactive Patient Education 2016 [employer].   Fluoxetine capsules or tablets (Depression/Mood Disorders) What is this medicine? FLUOXETINE (floo OX e teen) belongs to a class of drugs known as selective serotonin reuptake inhibitors (SSRIs). It helps to treat mood problems such as depression, obsessive compulsive disorder, and panic attacks. It can also treat certain eating disorders. This medicine may be used for other purposes; ask your health care provider or pharmacist if you have questions. What should I tell my health care provider before I take this medicine? They need to know if you have any of these conditions: -bipolar disorder or mania -diabetes -glaucoma -liver disease -psychosis -seizures -suicidal thoughts or history of attempted suicide -an unusual or allergic reaction to fluoxetine, other medicines, foods, dyes, or preservatives -pregnant or trying to get pregnant -breast-feeding How should I use this medicine? Take this medicine by mouth with a glass of water. Follow the directions on the prescription label. You can take this medicine with or without food. Take your medicine at regular intervals. Do not take it more often than directed. Do not stop taking this medicine suddenly except upon the advice of your doctor. Stopping this medicine too quickly may cause serious side effects or your condition may worsen. A special MedGuide will be given to you by the pharmacist with each prescription and refill. Be sure to read this information carefully each time. Talk to your pediatrician regarding the use of this medicine in children. While this drug may be prescribed for children as young as 7 years for selected conditions, precautions do apply. Overdosage: If you think you have taken too much of this medicine contact a poison control center or emergency room at once. NOTE: This medicine is only for you. Do not share this medicine with others. What if I miss a dose? If you  miss a dose, skip the missed dose and go back to your regular dosing schedule. Do not take double or extra doses. What may interact with this medicine? Do not take fluoxetine with any of the following medications: -other medicines containing fluoxetine, like Sarafem or Symbyax -cisapride -linezolid -MAOIs like Carbex, Eldepryl, Marplan, Nardil, and Parnate -methylene blue (injected into a vein) -pimozide -thioridazine This medicine may also interact with the following medications: -alcohol -aspirin and aspirin-like medicines -carbamazepine -certain medicines for depression, anxiety, or psychotic disturbances -certain medicines for migraine headaches like almotriptan, eletriptan, frovatriptan, naratriptan, rizatriptan, sumatriptan, zolmitriptan -digoxin -diuretics -fentanyl -flecainide -furazolidone -isoniazid -lithium -medicines for sleep -medicines that treat or prevent blood clots like warfarin, enoxaparin, and dalteparin -NSAIDs, medicines for pain and inflammation, like ibuprofen or naproxen -phenytoin -procarbazine -propafenone -rasagiline -ritonavir -supplements like St. John's wort, kava kava, valerian -tramadol -tryptophan -vinblastine This list may not describe all possible interactions. Give your health care provider a list of all the medicines, herbs, non-prescription drugs, or dietary supplements you use. Also tell them if you smoke, drink alcohol, or use illegal drugs. Some items may interact with your medicine. What should I watch for while using this medicine? Tell your doctor if your symptoms do not get better or if they get worse. Visit your doctor or health care professional for regular checks on your progress. Because it may take several weeks to see the full  effects of this medicine, it is important to continue your treatment as prescribed by your doctor. Patients and their families should watch out for new or worsening thoughts of suicide or depression. Also  watch out for sudden changes in feelings such as feeling anxious, agitated, panicky, irritable, hostile, aggressive, impulsive, severely restless, overly excited and hyperactive, or not being able to sleep. If this happens, especially at the beginning of treatment or after a change in dose, call your health care professional. Bonita Quin may get drowsy or dizzy. Do not drive, use machinery, or do anything that needs mental alertness until you know how this medicine affects you. Do not stand or sit up quickly, especially if you are an older patient. This reduces the risk of dizzy or fainting spells. Alcohol may interfere with the effect of this medicine. Avoid alcoholic drinks. Your mouth may get dry. Chewing sugarless gum or sucking hard candy, and drinking plenty of water may help. Contact your doctor if the problem does not go away or is severe. This medicine may affect blood sugar levels. If you have diabetes, check with your doctor or health care professional before you change your diet or the dose of your diabetic medicine. What side effects may I notice from receiving this medicine? Side effects that you should report to your doctor or health care professional as soon as possible: -allergic reactions like skin rash, itching or hives, swelling of the face, lips, or tongue -breathing problems -confusion -eye pain, changes in vision -fast or irregular heart rate, palpitations -flu-like fever, chills, cough, muscle or joint aches and pains -seizures -suicidal thoughts or other mood changes -swelling or redness in or around the eye -tremors -trouble sleeping -unusual bleeding or bruising -unusually tired or weak -vomiting Side effects that usually do not require medical attention (report to your doctor or health care professional if they continue or are bothersome): -change in sex drive or performance -diarrhea -dry mouth -flushing -headache -increased or decreased  appetite -nausea -sweating This list may not describe all possible side effects. Call your doctor for medical advice about side effects. You may report side effects to FDA at 1-800-FDA-1088. Where should I keep my medicine? Keep out of the reach of children. Store at room temperature between 15 and 30 degrees C (59 and 86 degrees F). Throw away any unused medicine after the expiration date. NOTE: This sheet is a summary. It may not cover all possible information. If you have questions about this medicine, talk to your doctor, pharmacist, or health care provider.    2016, Elsevier/Gold Standard. (2014-01-06 12:40:07)  Sertraline tablets What is this medicine? SERTRALINE (SER tra leen) is used to treat depression. It may also be used to treat obsessive compulsive disorder, panic disorder, post-trauma stress, premenstrual dysphoric disorder (PMDD) or social anxiety. This medicine may be used for other purposes; ask your health care provider or pharmacist if you have questions. What should I tell my health care provider before I take this medicine? They need to know if you have any of these conditions: -bipolar disorder or a family history of bipolar disorder -diabetes -glaucoma -heart disease -high blood pressure -history of irregular heartbeat -history of low levels of calcium, magnesium, or potassium in the blood -if you often drink alcohol -liver disease -receiving electroconvulsive therapy -seizures -suicidal thoughts, plans, or attempt; a previous suicide attempt by you or a family member -thyroid disease -an unusual or allergic reaction to sertraline, other medicines, foods, dyes, or preservatives -pregnant or trying to get  pregnant -breast-feeding How should I use this medicine? Take this medicine by mouth with a glass of water. Follow the directions on the prescription label. You can take it with or without food. Take your medicine at regular intervals. Do not take your  medicine more often than directed. Do not stop taking this medicine suddenly except upon the advice of your doctor. Stopping this medicine too quickly may cause serious side effects or your condition may worsen. A special MedGuide will be given to you by the pharmacist with each prescription and refill. Be sure to read this information carefully each time. Talk to your pediatrician regarding the use of this medicine in children. While this drug may be prescribed for children as young as 7 years for selected conditions, precautions do apply. Overdosage: If you think you have taken too much of this medicine contact a poison control center or emergency room at once. NOTE: This medicine is only for you. Do not share this medicine with others. What if I miss a dose? If you miss a dose, take it as soon as you can. If it is almost time for your next dose, take only that dose. Do not take double or extra doses. What may interact with this medicine? Do not take this medicine with any of the following medications: -certain medicines for fungal infections like fluconazole, itraconazole, ketoconazole, posaconazole, voriconazole -cisapride -disulfiram -dofetilide -linezolid -MAOIs like Carbex, Eldepryl, Marplan, Nardil, and Parnate -metronidazole -methylene blue (injected into a vein) -pimozide -thioridazine -ziprasidone This medicine may also interact with the following medications: -alcohol -aspirin and aspirin-like medicines -certain medicines for depression, anxiety, or psychotic disturbances -certain medicines for irregular heart beat like flecainide, propafenone -certain medicines for migraine headaches like almotriptan, eletriptan, frovatriptan, naratriptan, rizatriptan, sumatriptan, zolmitriptan -certain medicines for sleep -certain medicines for seizures like carbamazepine, valproic acid, phenytoin -certain medicines that treat or prevent blood clots like warfarin, enoxaparin,  dalteparin -cimetidine -digoxin -diuretics -fentanyl -furazolidone -isoniazid -lithium -NSAIDs, medicines for pain and inflammation, like ibuprofen or naproxen -other medicines that prolong the QT interval (cause an abnormal heart rhythm) -procarbazine -rasagiline -supplements like St. John's wort, kava kava, valerian -tolbutamide -tramadol -tryptophan This list may not describe all possible interactions. Give your health care provider a list of all the medicines, herbs, non-prescription drugs, or dietary supplements you use. Also tell them if you smoke, drink alcohol, or use illegal drugs. Some items may interact with your medicine. What should I watch for while using this medicine? Tell your doctor if your symptoms do not get better or if they get worse. Visit your doctor or health care professional for regular checks on your progress. Because it may take several weeks to see the full effects of this medicine, it is important to continue your treatment as prescribed by your doctor. Patients and their families should watch out for new or worsening thoughts of suicide or depression. Also watch out for sudden changes in feelings such as feeling anxious, agitated, panicky, irritable, hostile, aggressive, impulsive, severely restless, overly excited and hyperactive, or not being able to sleep. If this happens, especially at the beginning of treatment or after a change in dose, call your health care professional. Bonita Quin may get drowsy or dizzy. Do not drive, use machinery, or do anything that needs mental alertness until you know how this medicine affects you. Do not stand or sit up quickly, especially if you are an older patient. This reduces the risk of dizzy or fainting spells. Alcohol may interfere with the effect  of this medicine. Avoid alcoholic drinks. Your mouth may get dry. Chewing sugarless gum or sucking hard candy, and drinking plenty of water may help. Contact your doctor if the problem  does not go away or is severe. What side effects may I notice from receiving this medicine? Side effects that you should report to your doctor or health care professional as soon as possible: -allergic reactions like skin rash, itching or hives, swelling of the face, lips, or tongue -black or bloody stools, blood in the urine or vomit -fast, irregular heartbeat -feeling faint or lightheaded, falls -hallucination, loss of contact with reality -seizures -suicidal thoughts or other mood changes -unusual bleeding or bruising -unusually weak or tired -vomiting Side effects that usually do not require medical attention (report to your doctor or health care professional if they continue or are bothersome): -change in appetite -change in sex drive or performance -diarrhea -increased sweating -indigestion, nausea -tremors This list may not describe all possible side effects. Call your doctor for medical advice about side effects. You may report side effects to FDA at 1-800-FDA-1088. Where should I keep my medicine? Keep out of the reach of children. Store at room temperature between 15 and 30 degrees C (59 and 86 degrees F). Throw away any unused medicine after the expiration date. NOTE: This sheet is a summary. It may not cover all possible information. If you have questions about this medicine, talk to your doctor, pharmacist, or health care provider.    2016, Elsevier/Gold Standard. (2012-08-10 12:57:35)  Tranexamic acid oral tablets What is this medicine? TRANEXAMIC ACID (TRAN ex AM ik AS id) slows down or stops blood clots from being broken down. This medicine is used to treat heavy monthly menstrual bleeding. This medicine may be used for other purposes; ask your health care provider or pharmacist if you have questions. What should I tell my health care provider before I take this medicine? They need to know if you have any of these conditions: -bleeding in the brain -blood clotting  problems -kidney disease -vision problems -an unusual allergic reaction to tranexamic acid, other medicines, foods, dyes, or preservatives -pregnant or trying to get pregnant -breast-feeding How should I use this medicine? Take this medicine by mouth with a glass of water. Follow the directions on the prescription label. Do not cut, crush, or chew this medicine. You can take it with or without food. If it upsets your stomach, take it with food. Take your medicine at regular intervals. Do not take it more often than directed. Do not stop taking except on your doctor's advice. Do not take this medicine until your period has started. Do not take it for more than 5 days in a row. Do not take this medicine when you do not have your period. Talk to your pediatrician regarding the use of this medicine in children. While this drug may be prescribed for female children as young as 69 years of age for selected conditions, precautions do apply. Overdosage: If you think you have taken too much of this medicine contact a poison control center or emergency room at once. NOTE: This medicine is only for you. Do not share this medicine with others. What if I miss a dose? If you miss a dose, take it when you remember, and then take your next dose at least 6 hours later. Do not take more than 2 tablets at a time to make up for missed doses. What may interact with this medicine? Do not take this  medicine with any of the following medications: -estrogens -birth control pills, patches, injections, rings or other devices that contain both an estrogen and a progestin This medicine may also interact with the following medications: -certain medicines used to help your blood clot -tretinoin (taken by mouth) This list may not describe all possible interactions. Give your health care provider a list of all the medicines, herbs, non-prescription drugs, or dietary supplements you use. Also tell them if you smoke, drink  alcohol, or use illegal drugs. Some items may interact with your medicine. What should I watch for while using this medicine? Tell your doctor or healthcare professional if your symptoms do not start to get better or if they get worse. Tell your doctor or healthcare professional if you notice any eye problems while taking this medicine. Your doctor will refer you to an eye doctor who will examine your eyes. What side effects may I notice from receiving this medicine? Side effects that you should report to your doctor or health care professional as soon as possible: -allergic reactions like skin rash, itching or hives, swelling of the face, lips, or tongue -breathing difficulties -changes in vision -sudden or severe pain in the chest, legs, head, or groin -unusually weak or tired Side effects that usually do not require medical attention (Report these to your doctor or health care professional if they continue or are bothersome.): -back pain -headache -muscle or joint aches -sinus and nasal problems -stomach pain -tiredness This list may not describe all possible side effects. Call your doctor for medical advice about side effects. You may report side effects to FDA at 1-800-FDA-1088. Where should I keep my medicine? Keep out of the reach of children. Store at room temperature between 15 and 30 degrees C (59 and 86 degrees F). Throw away any unused medicine after the expiration date. NOTE: This sheet is a summary. It may not cover all possible information. If you have questions about this medicine, talk to your doctor, pharmacist, or health care provider.    2016, Elsevier/Gold Standard. (2013-07-07 14:14:28)

## 2015-11-12 ENCOUNTER — Ambulatory Visit (INDEPENDENT_AMBULATORY_CARE_PROVIDER_SITE_OTHER): Payer: Managed Care, Other (non HMO) | Admitting: Family Medicine

## 2015-11-12 VITALS — BP 116/76 | HR 93 | Temp 98.3°F | Resp 17 | Ht 64.5 in | Wt 148.0 lb

## 2015-11-12 DIAGNOSIS — R300 Dysuria: Secondary | ICD-10-CM | POA: Diagnosis not present

## 2015-11-12 DIAGNOSIS — N3000 Acute cystitis without hematuria: Secondary | ICD-10-CM | POA: Diagnosis not present

## 2015-11-12 LAB — POCT URINALYSIS DIP (MANUAL ENTRY)
BILIRUBIN UA: NEGATIVE
BILIRUBIN UA: NEGATIVE
Blood, UA: NEGATIVE
Glucose, UA: NEGATIVE
LEUKOCYTES UA: NEGATIVE
NITRITE UA: NEGATIVE
PH UA: 8
PROTEIN UA: NEGATIVE
Spec Grav, UA: 1.015
Urobilinogen, UA: 0.2

## 2015-11-12 LAB — POC MICROSCOPIC URINALYSIS (UMFC): MUCUS RE: ABSENT

## 2015-11-12 LAB — POCT URINE PREGNANCY: PREG TEST UR: NEGATIVE

## 2015-11-12 MED ORDER — DOXYCYCLINE HYCLATE 100 MG PO CAPS: 100.0000 mg | ORAL_CAPSULE | Freq: Two times a day (BID) | ORAL | 0 refills | Status: DC

## 2015-11-12 NOTE — Patient Instructions (Addendum)
Follow-up urology for appointment.  Doxycyline 100 mg twice daily for 10 days.  Call me on Thursday if not improved. Will consider CT to rule out stone.   IF you received an x-ray today, you will [facility]. Please contact [facility] at [phone] with questions or concerns regarding your invoice.   IF you received labwork today, you will receive an invoice from [facility]. Please contact [facility] at [phone] with questions or concerns regarding your invoice.   Our billing staff will not be able to assist you with questions regarding bills from these companies.  You will be contacted with the lab results as soon as they are available. The fastest way to get your results is to activate your My Chart account. Instructions are located on the last page of this paperwork. If you have not heard from us regarding the results in 2 weeks, please contact this office.

## 2015-11-12 NOTE — Progress Notes (Signed)
Patient ID: Emily [name], female    DOB: Aug 08, 1980, 35 y.o.   MRN: [id number]  PCP: REDMON,NOELLE, PA-C  Chief Complaint  Patient presents with  . Urinary Frequency  . Dysuria    Since Thursday. Pt states hx of kidney and bladder infections.     Subjective:   HPI 35 year old female presents for evaluation of dysuria times 5 days. She reports a history of interstitial cystitis in which she is treated with  chronic nitrofurantion. She reports 1 month ago she had a kidney stone that she passed.  Reports over the last 5 days experiencing irritation of her meatus with urination, increased frequency of urination, CVA tenderness of bilaterally, and lower abdominal pressure. Denies any new sexual partners. She reports sensation of nausea without vomiting. Since Thursday she has resumed taking her nitrofurantion without relief of symptoms.   Review of Systems See HPI  Patient Active Problem List   Diagnosis Date Noted  . Cardiac murmur 05/10/2014  . Allergic rhinitis 05/10/2014  . 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 Interstitial cystitis 04/25/2014     Prior to Admission medications   Medication Sig Start Date End Date Taking? Authorizing Provider  EPINEPHrine (EPI-PEN) 0.3 mg/0.3 mL DEVI Inject 0.3 mLs (0.3 mg total) into the muscle once. 02/15/12  Yes Collene GobbleSteven A Daub, MD  Glycine 500 MG PACK Take by mouth.   Yes Historical Provider, MD  loratadine (CLARITIN) 10 MG tablet Take 10 mg by mouth daily.   Yes Historical Provider, MD  Magnesium 250 MG TABS Take by mouth.   Yes Historical Provider, MD  Multiple Vitamins-Minerals (MULTIVITAMIN PO) Take by mouth daily.   Yes Historical Provider, MD  valACYclovir (VALTREX) 500 MG tablet TAKE 1 TABLET BY MOUTH DAILY. 04/26/15  Yes Verner Choleborah S Leonard, CNM     Allergies  Allergen Reactions  . Other     LoLoestrin--made patient "feel depressed--suicidal"  . Penicillins     Her mom unsure if her or her sister is allergic, but per patient she has  had PCN without any problem   . Quinolones     Rash   . Sulfa Antibiotics Swelling    "swelling of tongue, hives"       Objective:  Physical Exam  Constitutional: She is oriented to person, place, and time. She appears well-developed and well-nourished.  HENT:  Head: Normocephalic and atraumatic.  Right Ear: External ear normal.  Left Ear: External ear normal.  Nose: Nose normal.  Eyes: Conjunctivae and EOM are normal. Pupils are equal, round, and reactive to light.  Neck: Normal range of motion. Neck supple.  Cardiovascular: Normal rate, regular rhythm, normal heart sounds and intact distal pulses.   Pulmonary/Chest: Effort normal and breath sounds normal.  Abdominal: Soft. Normal appearance and bowel sounds are normal. There is CVA tenderness.  Neurological: She is alert and oriented to person, place, and time.  Skin: Skin is warm and dry.  Psychiatric: She has a normal mood and affect. Her behavior is normal. Judgment and thought content normal.   Vitals:   11/12/15 1403  BP: 116/76  Pulse: 93  Resp: 17  Temp: 98.3 F (36.8 C)     Assessment & Plan:  1. Dysuria - POCT urinalysis dipstick - POCT Microscopic Urinalysis ([facility]) - Urine culture - POCT urine pregnancy  2. Acute cystitis without hematuria  35 year old female presents with symptoms consistent with acute cystitis although urinalysis results reflect no active infection. I attribute this to the patent chronically  taking Nitrofurantoin for interstitial cystitis prophylaxis treatment.  With taking nitrofurantoin I would also expect her urine culture to be negative of any growth.  The patients symptoms in addition to having positive CVA tenderness, increases the likelihood of an active infection.   Plan:  Doxycycline 100 mg twice daily for 10 days.  Follow-up as needed.  Godfrey Pick. Tiburcio Pea, MSN, FNP-C [facility]

## 2015-11-13 LAB — URINE CULTURE: ORGANISM ID, BACTERIA: NO GROWTH

## 2015-12-01 ENCOUNTER — Other Ambulatory Visit: Payer: Self-pay | Admitting: Certified Nurse Midwife

## 2015-12-03 NOTE — Telephone Encounter (Signed)
Medication refill request: Valtrex  Last AEX:  08-03-15 Next AEX: 08-06-16 Last MMG (if hormonal medication request): N/A Refill authorized: please advise

## 2016-03-02 ENCOUNTER — Ambulatory Visit (HOSPITAL_COMMUNITY)
Admission: EM | Admit: 2016-03-02 | Discharge: 2016-03-02 | Disposition: A | Discharge status: Home / Self Care | Payer: Managed Care, Other (non HMO) | Attending: Family Medicine | Admitting: Family Medicine

## 2016-03-02 ENCOUNTER — Encounter (HOSPITAL_COMMUNITY): Payer: Self-pay | Admitting: Emergency Medicine

## 2016-03-02 DIAGNOSIS — Z79899 Other long term (current) drug therapy: Secondary | ICD-10-CM | POA: Insufficient documentation

## 2016-03-02 DIAGNOSIS — B9789 Other viral agents as the cause of diseases classified elsewhere: Secondary | ICD-10-CM

## 2016-03-02 DIAGNOSIS — J069 Acute upper respiratory infection, unspecified: Secondary | ICD-10-CM | POA: Diagnosis not present

## 2016-03-02 DIAGNOSIS — J029 Acute pharyngitis, unspecified: Secondary | ICD-10-CM | POA: Insufficient documentation

## 2016-03-02 DIAGNOSIS — R05 Cough: Secondary | ICD-10-CM | POA: Diagnosis not present

## 2016-03-02 LAB — POCT RAPID STREP A: Streptococcus, Group A Screen (Direct): NEGATIVE

## 2016-03-02 NOTE — Discharge Instructions (Signed)
Suggest this is a flu like illness, treat symptomatically with Ibuprofen 800 mg every 8 hours to help you feel better. Use Delsym for cough as needed, rest and fluids. F/U if you worsens.

## 2016-03-02 NOTE — ED Triage Notes (Signed)
[facility] a [facility] of [facility] sore throat, headache and fatigue that started yesterday.

## 2016-03-02 NOTE — ED Provider Notes (Signed)
CSN: [id number]     Arrival date & time 03/02/16  1412 History   First MD Initiated Contact with Patient 03/02/16 1507     Chief Complaint  Patient presents with  . Sore Throat   (Consider location/radiation/quality/duration/timing/severity/associated sxs/prior Treatment) 36 yo presents with sore throat, nasal congestion and malaise x <24 hours without fever or chills. Mild cough that is non-productive.       Past Medical History:  Diagnosis Date  . Abnormal Pap smear of cervix    2014 ASCUS HPV HR+, 2015 LSIL HPV HR+  . Depression   . GERD (gastroesophageal reflux disease)   . Heart murmur   . IBS (irritable bowel syndrome)   . Mitral valve prolapse   . MRSA (methicillin resistant Staphylococcus aureus)    infection on LT side of face, and forehead (02/2011)  . Nephrolithiasis   . UTI (urinary tract infection)    Past Surgical History:  Procedure Laterality Date  . CESAREAN SECTION  2007 & 2011   x2  . COLPOSCOPY  [id number]  . ESOPHAGOGASTRODUODENOSCOPY  03/25/2011   Procedure: ESOPHAGOGASTRODUODENOSCOPY (EGD);  Surgeon: Barrie FolkJohn C Hayes, MD;  [facility];  Service: Endoscopy;  Laterality: N/A;  . SAVORY DILATION  03/25/2011   Procedure: SAVORY DILATION;  Surgeon: Barrie FolkJohn C Hayes, MD;  [facility];  Service: Endoscopy;  Laterality: N/A;  . TUBAL LIGATION  2011  . WISDOM TOOTH EXTRACTION  2003   Family History  Problem Relation Age of Onset  . Diabetes Mother     Type 2  . Hypertension Mother   . Cancer Maternal Grandmother   . Breast cancer Maternal Grandmother 50  . Cancer Maternal Grandfather   . Diabetes Maternal Grandfather     Type 1   . Lung cancer Maternal Grandfather   . Heart attack Paternal Grandfather    Social History  Substance Use Topics  . Smoking status: Never Smoker  . Smokeless tobacco: Never Used  . Alcohol use No     Comment: less than 1 a month    OB History    Gravida Para Term Preterm AB Living   2 2 2     2    SAB TAB  Ectopic Multiple Live Births           1     Review of Systems  Constitutional: Positive for fatigue. Negative for chills and fever.  HENT: Positive for congestion, sinus pain, sinus pressure and sore throat.   Eyes: Negative.   Respiratory: Positive for cough. Negative for shortness of breath, wheezing and stridor.   Neurological: Negative.     Allergies  Other; Penicillins; Quinolones; and Sulfa antibiotics  Home Medications   Prior to Admission medications   Medication Sig Start Date End Date Taking? Authorizing Provider  Magnesium 250 MG TABS Take by mouth.   Yes Historical Provider, MD  Multiple Vitamins-Minerals (MULTIVITAMIN PO) Take by mouth daily.   Yes Historical Provider, MD  valACYclovir (VALTREX) 500 MG tablet Take 1 tablet (500 mg total) by mouth daily. For suppression, increase to bid for 3 days at onset of outbreak 12/03/15  Yes Verner Choleborah S Leonard, CNM   Meds Ordered and Administered this Visit  Medications - No data to display  BP 104/60 (BP Location: Right Arm)   Pulse 96   Temp 98.1 F (36.7 C) (Oral)   Resp 18   SpO2 100%  No data found.   Physical Exam  Constitutional: She is oriented to person, place, and  time. She appears well-developed and well-nourished. No distress.  HENT:  Head: Normocephalic and atraumatic.  Right Ear: External ear normal.  Left Ear: External ear normal.  Mild injection of the oropharynx  Neck: Normal range of motion.  Cardiovascular: Normal rate and regular rhythm.   Pulmonary/Chest: Effort normal and breath sounds normal.  Lymphadenopathy:    She has no cervical adenopathy.  Neurological: She is alert and oriented to person, place, and time.  Skin: Skin is warm and dry. She is not diaphoretic.  Psychiatric: Her behavior is normal.  Nursing note and vitals reviewed.   Urgent Care Course     Procedures (including critical care time)  Labs Review Labs Reviewed  POCT RAPID STREP A    Imaging Review No results  found.   Visual Acuity Review  Right Eye Distance:   Left Eye Distance:   Bilateral Distance:    Right Eye Near:   Left Eye Near:    Bilateral Near:         MDM   1. Sore throat   2. Viral URI with cough    Strep negative. No indication for abx or anti-viral therapy at this time. Treat symptomatically. Return parameters are given.     Riki Sheer, PA-C 03/02/16 [id number]

## 2016-03-04 LAB — CULTURE, GROUP A STREP (THRC)

## 2016-03-21 ENCOUNTER — Other Ambulatory Visit: Payer: Self-pay | Admitting: Certified Nurse Midwife

## 2016-03-21 NOTE — Telephone Encounter (Signed)
Medication refill request: valtrex  Last AEX:  08/03/15 DL  Next AEX: 0/98/117/11/18 DL  Last MMG (if hormonal medication request): none Refill authorized: 12/03/15 #30tabs/2R. Please advise.

## 2016-04-08 ENCOUNTER — Other Ambulatory Visit: Payer: Self-pay | Admitting: Family Medicine

## 2016-04-08 ENCOUNTER — Ambulatory Visit
Admission: RE | Admit: 2016-04-08 | Discharge: 2016-04-08 | Disposition: A | Discharge status: Home / Self Care | Payer: Managed Care, Other (non HMO) | Source: Ambulatory Visit | Attending: Family Medicine | Admitting: Family Medicine

## 2016-04-08 DIAGNOSIS — M25511 Pain in right shoulder: Secondary | ICD-10-CM

## 2016-07-22 ENCOUNTER — Telehealth: Payer: Self-pay | Admitting: Certified Nurse Midwife

## 2016-07-22 NOTE — Telephone Encounter (Signed)
Spoke with patient. Patient requesting Rx for antibiotic for UTI prevention. Patient states she has done this in the past, takes only after intercourse, previously prescribed by Urologist. Patient states she was advised that she may f/u with [facility] for this prescription, has history of IC. Denies any current symptoms. Recommended OV for further evaluation and discussion. Patient scheduled for 07/25/16 at 8am with Leota Sauerseborah Leonard, CNM. Advised patient  Leota SauersDeborah Leonard, CNM would review, will return call if any additional recommendations. Patient verbalizes understanding and is agreeable.  Last AEX 10/24/15.   Routing to provider for final review. Patient is agreeable to disposition. Will close encounter.

## 2016-07-22 NOTE — Telephone Encounter (Signed)
Patient called and requested a call back from the nurse. She's requesting a prescription to take after having sex to prevent a urinary tract infection since she's had problems with this in the past. The patient states she currently has no symptoms but she is getting ready to go on her honeymoon and wants to prevent any possible problems.  Pharmacy on file confirmed.

## 2016-07-25 ENCOUNTER — Ambulatory Visit (INDEPENDENT_AMBULATORY_CARE_PROVIDER_SITE_OTHER): Payer: Managed Care, Other (non HMO) | Admitting: Certified Nurse Midwife

## 2016-07-25 ENCOUNTER — Encounter: Payer: Self-pay | Admitting: Certified Nurse Midwife

## 2016-07-25 VITALS — BP 104/64 | HR 68 | Resp 16 | Ht 64.5 in | Wt 144.0 lb

## 2016-07-25 DIAGNOSIS — N390 Urinary tract infection, site not specified: Secondary | ICD-10-CM | POA: Diagnosis not present

## 2016-07-25 DIAGNOSIS — N3010 Interstitial cystitis (chronic) without hematuria: Secondary | ICD-10-CM | POA: Diagnosis not present

## 2016-07-25 MED ORDER — NITROFURANTOIN MONOHYD MACRO 100 MG PO CAPS: ORAL_CAPSULE | ORAL | 2 refills | Status: DC

## 2016-07-25 NOTE — Progress Notes (Signed)
Subjective:     Patient ID: Emily [name], female   DOB: November 16, 1980, 36 y.o.   MRN: [id number]  HPI  36 yo g2 p2002 white female to discuss prevention of post coital UTI with Macrobid. Patient has long history of IC and treatment. Manages with diet and avoidance of other substances that initiates flares. Has used Macrobid in past with good results. Has documented history in Epic with treatment for UTI's also. Patient has seen Urology in past with no changes in recommendation. Would like to have this managed here due to gyn care here. Will be going on honeymoon in 2 days and would like to enjoy with no issues. Denies any urinary symptoms today. Has aex scheduled 08/06/16. No other health issues today.   Review of Systems  Constitutional: Negative.   Gastrointestinal: Negative.   Genitourinary: Negative.   Skin: Negative.    Pertinent to HPI     Objective:   Physical Exam  Constitutional: She is oriented to person, place, and time. She appears well-developed and well-nourished.  Neurological: She is alert and oriented to person, place, and time.  Psychiatric: She has a normal mood and affect.  No exam done, consult only.    Assessment:     History of IC which she manages with dietary avoidance and uses Azo to determine if IC or UTI Post Coital UTI history see epic needs prophylactic medication for prn use    Plan:     Discussed referral to Dr. Laverle PatterBorden if needed for IC management if status changes, controlled at present. Discussed risk/benefits of post coital medication use, patient would like to prevent UTI and has used before with good success. Discussed emptying bladder prior to coitus and after also to prevent. Increase water intake after also. Voiced understanding and will call if symptoms continue. Questions addressed. Rx Macrobid see order with instructions.  Rv prn and aex7 /11/18  20 minutes in face to face discussion of UTI prevention and IC

## 2016-07-25 NOTE — Patient Instructions (Signed)

## 2016-08-05 NOTE — Progress Notes (Signed)
36 y.o. [id number] Married  Caucasian Fe here for annual exam. Recently married with her two boys, adjusting to change. Oldest has autism, youngest ADHD. Spouse also provides foster care for a older child with autism. So big adjustment for all, but doing well. Surviving now!! Has not seen cardiology in several years for heart murmur, denies any SOB or palpitations.. Not having any other symptoms.  IC is manageable now, occasional urinary frequency, no problems with. Used Macrobid x 1 on honeymoon, with no other urinary symptoms. No HSV outbreaks. No other health issues today.  Patient's last menstrual period was 07/11/2016 (exact date).          Sexually active: Yes.    The current method of family planning is tubal ligation.    Exercising: No.  exercise Smoker:  no  Health Maintenance: Pap:  01-06-14 ASCUS HPV HR +, 08-06-15 neg HPV HR neg History of Abnormal Pap: yes MMG:  none Self Breast exams: no Colonoscopy:  none BMD:   none TDaP:  > 10 years Shingles: no Pneumonia: no Hep C and HIV: both neg 2014 Labs: none   reports that she has never smoked. She has never used smokeless tobacco. She reports that she drinks alcohol. She reports that she does not use drugs.  Past Medical History:  Diagnosis Date  . Abnormal Pap smear of cervix    2014 ASCUS HPV HR+, 2015 LSIL HPV HR+  . Depression   . GERD (gastroesophageal reflux disease)   . Heart murmur   . IBS (irritable bowel syndrome)   . Mitral valve prolapse   . MRSA (methicillin resistant Staphylococcus aureus)    infection on LT side of face, and forehead (02/2011)  . Nephrolithiasis   . UTI (urinary tract infection)     Past Surgical History:  Procedure Laterality Date  . CESAREAN SECTION  2007 & 2011   x2  . COLPOSCOPY  [id number]  . ESOPHAGOGASTRODUODENOSCOPY  03/25/2011   Procedure: ESOPHAGOGASTRODUODENOSCOPY (EGD);  Surgeon: Barrie FolkJohn C Hayes, MD;  [facility];  Service: Endoscopy;  Laterality: N/A;  . SAVORY  DILATION  03/25/2011   Procedure: SAVORY DILATION;  Surgeon: Barrie FolkJohn C Hayes, MD;  [facility];  Service: Endoscopy;  Laterality: N/A;  . TUBAL LIGATION  2011  . WISDOM TOOTH EXTRACTION  2003    Current Outpatient Prescriptions  Medication Sig Dispense Refill  . fexofenadine (ALLEGRA) 180 MG tablet Take 180 mg by mouth daily.    Marland Kitchen. MACA ROOT PO Take by mouth.    . Magnesium 250 MG TABS Take by mouth.    . Multiple Vitamins-Minerals (MULTIVITAMIN PO) Take by mouth daily.    . nitrofurantoin, macrocrystal-monohydrate, (MACROBID) 100 MG capsule Take one capsule post coital if symptoms persist take bid x 3 days and call office if not resolving 30 capsule 2  . Probiotic Product (PROBIOTIC PO) Take by mouth daily.    Marland Kitchen. UNABLE TO FIND magnesium & b12 po    . valACYclovir (VALTREX) 500 MG tablet TAKE 1 TABLET BY MOUTH DAILY. FOR SUPPRESSION, INCREASE TO TWICE A DAY FOR 3 DAYS AT ONSET OF OUTBREAK 30 tablet 2   No current facility-administered medications for this visit.     Family History  Problem Relation Age of Onset  . Diabetes Mother        Type 2  . Hypertension Mother   . Cancer Maternal Grandmother   . Breast cancer Maternal Grandmother 50  . Cancer Maternal Grandfather   . Diabetes Maternal  Grandfather        Type 1   . Lung cancer Maternal Grandfather   . Heart attack Paternal Grandfather     ROS:  Pertinent items are noted in HPI.  Otherwise, a comprehensive ROS was negative.  Exam:   BP 102/72   Pulse 64   Resp 16   Ht 5' 4.5" (1.638 m)   Wt 143 lb (64.9 kg)   LMP 07/11/2016 (Exact Date)   BMI 24.17 kg/m  Height: 5' 4.5" (163.8 cm) Ht Readings from Last 3 Encounters:  08/06/16 5' 4.5" (1.638 m)  07/25/16 5' 4.5" (1.638 m)  11/12/15 5' 4.5" (1.638 m)    General appearance: alert, cooperative and appears stated age Head: Normocephalic, without obvious abnormality, atraumatic Neck: no adenopathy, supple, symmetrical, trachea midline and thyroid normal to  inspection and palpation Lungs: clear to auscultation bilaterally Breasts: normal appearance, no masses or tenderness, No nipple retraction or dimpling, No nipple discharge or bleeding, No axillary or supraclavicular adenopathy Heart: regular rate and rhythm Abdomen: soft, non-tender; no masses,  no organomegaly Extremities: extremities normal, atraumatic, no cyanosis or edema Skin: Skin color, texture, turgor normal. No rashes or lesions Lymph nodes: Cervical, supraclavicular, and axillary nodes normal. No abnormal inguinal nodes palpated Neurologic: Grossly normal   Pelvic: External genitalia:  no lesions              Urethra:  normal appearing urethra with no masses, tenderness or lesions              Bartholin's and Skene's: normal                 Vagina: normal appearing vagina with normal color and discharge, no lesions              Cervix: multiparous appearance, no cervical motion tenderness and no lesions              Pap taken: Yes.   Bimanual Exam:  Uterus:  normal size, contour, position, consistency, mobility, non-tender              Adnexa: normal adnexa and no mass, fullness, tenderness               Rectovaginal: Confirms               Anus:  normal sphincter tone, no lesions  Chaperone present: yes  A:  Well Woman with normal exam  Contraception Tubal  History of heart murmur as a child, more prominently heard today on exam  History of HSV   IC under control with diet  Immunization due  Screening labs  P:   Reviewed health and wellness pertinent to exam  Discussed heart murmur and recommend evaluation with cardiology to make sure no changes at this point in her life, patient agreeable. Patient will be called with referral information and appointment.  Will update Rx Valtrex see order with instructions  Request TDAP today  Screening labs: CBC,Lipid panel, TSH, Vitamin D  Pap smear: yes   counseled on breast self exam, adequate intake of calcium and vitamin D,  diet and exercise  return annually or prn  An After Visit Summary was printed and given to the patient.

## 2016-08-06 ENCOUNTER — Other Ambulatory Visit (HOSPITAL_COMMUNITY)
Admission: RE | Admit: 2016-08-06 | Discharge: 2016-08-06 | Disposition: A | Discharge status: Home / Self Care | Payer: Managed Care, Other (non HMO) | Source: Ambulatory Visit | Attending: Certified Nurse Midwife | Admitting: Certified Nurse Midwife

## 2016-08-06 ENCOUNTER — Ambulatory Visit (INDEPENDENT_AMBULATORY_CARE_PROVIDER_SITE_OTHER): Payer: Managed Care, Other (non HMO) | Admitting: Certified Nurse Midwife

## 2016-08-06 ENCOUNTER — Encounter: Payer: Self-pay | Admitting: Certified Nurse Midwife

## 2016-08-06 VITALS — BP 102/72 | HR 64 | Resp 16 | Ht 64.5 in | Wt 143.0 lb

## 2016-08-06 DIAGNOSIS — Z124 Encounter for screening for malignant neoplasm of cervix: Secondary | ICD-10-CM

## 2016-08-06 DIAGNOSIS — Z8679 Personal history of other diseases of the circulatory system: Secondary | ICD-10-CM | POA: Diagnosis not present

## 2016-08-06 DIAGNOSIS — Z01419 Encounter for gynecological examination (general) (routine) without abnormal findings: Secondary | ICD-10-CM | POA: Diagnosis not present

## 2016-08-06 DIAGNOSIS — Z23 Encounter for immunization: Secondary | ICD-10-CM | POA: Diagnosis not present

## 2016-08-06 DIAGNOSIS — Z0000 Encounter for general adult medical examination without abnormal findings: Secondary | ICD-10-CM

## 2016-08-06 NOTE — Patient Instructions (Signed)

## 2016-08-07 ENCOUNTER — Other Ambulatory Visit: Payer: Self-pay | Admitting: Certified Nurse Midwife

## 2016-08-07 ENCOUNTER — Other Ambulatory Visit: Payer: Self-pay

## 2016-08-07 DIAGNOSIS — E559 Vitamin D deficiency, unspecified: Secondary | ICD-10-CM

## 2016-08-07 LAB — LIPID PANEL
CHOL/HDL RATIO: 2.4 ratio (ref 0.0–4.4)
Cholesterol, Total: 147 mg/dL (ref 100–199)
HDL: 62 mg/dL (ref >39)
LDL CALC: 74 mg/dL (ref 0–99)
Triglycerides: 53 mg/dL (ref 0–149)
VLDL Cholesterol Cal: 11 mg/dL (ref 5–40)

## 2016-08-07 LAB — CBC
HEMATOCRIT: 39.3 % (ref 34.0–46.6)
Hemoglobin: 13.2 g/dL (ref 11.1–15.9)
MCH: 28.6 pg (ref 26.6–33.0)
MCHC: 33.6 g/dL (ref 31.5–35.7)
MCV: 85 fL (ref 79–97)
Platelets: 221 10*3/uL (ref 150–379)
RBC: 4.62 x10E6/uL (ref 3.77–5.28)
RDW: 13.6 % (ref 12.3–15.4)
WBC: 6.6 10*3/uL (ref 3.4–10.8)

## 2016-08-07 LAB — TSH: TSH: 1.62 u[IU]/mL (ref 0.450–4.500)

## 2016-08-07 LAB — VITAMIN D 25 HYDROXY (VIT D DEFICIENCY, FRACTURES): VIT D 25 HYDROXY: 27.6 ng/mL — AB (ref 30.0–100.0)

## 2016-08-08 LAB — CYTOLOGY - PAP: DIAGNOSIS: NEGATIVE

## 2016-08-13 ENCOUNTER — Telehealth: Payer: Self-pay | Admitting: Certified Nurse Midwife

## 2016-08-13 NOTE — Telephone Encounter (Signed)
Placed call to patient to review referral information. Left voicemail to return call. Patient immediately called back before completing telephone note. Reviewed referral information. Patient agreeable. Ok to close encounter.

## 2016-08-29 ENCOUNTER — Encounter: Payer: Self-pay | Admitting: Cardiology

## 2016-08-29 ENCOUNTER — Ambulatory Visit (INDEPENDENT_AMBULATORY_CARE_PROVIDER_SITE_OTHER): Payer: Managed Care, Other (non HMO) | Admitting: Cardiology

## 2016-08-29 DIAGNOSIS — N3010 Interstitial cystitis (chronic) without hematuria: Secondary | ICD-10-CM

## 2016-08-29 DIAGNOSIS — R0789 Other chest pain: Secondary | ICD-10-CM | POA: Insufficient documentation

## 2016-08-29 DIAGNOSIS — R011 Cardiac murmur, unspecified: Secondary | ICD-10-CM

## 2016-08-29 NOTE — Patient Instructions (Signed)
Medication Instructions:  Your physician recommends that you continue on your current medications as directed. Please refer to the Current Medication list given to you today. If you need a refill on your cardiac medications before your next appointment, please call your pharmacy.  Testing/Procedures: Your physician has requested that you have an echocardiogram(NEXT WEEK PER LUKE). Echocardiography is a painless test that uses sound waves to create images of your heart. It provides your doctor with information about the size and shape of your heart and how well your heart's chambers and valves are working. This procedure takes approximately one hour. There are no restrictions for this procedure.  Follow-Up: Your physician wants you to follow-up in: 1ST AVAILABLE WITH DR [id number].   Special Instructions: WE WILL CALL YOU WITH RESULTS FROM ECHO  [facility] you [facility] choosing [facility]!!

## 2016-08-29 NOTE — Progress Notes (Signed)
Thank you for seeing this patient! 

## 2016-08-29 NOTE — Progress Notes (Signed)
08/29/2016 Emily [name]   1980/08/21  [id number]  Primary Physician Milus Height, PA-C Primary Cardiologist: Dr Royann Shivers  HPI:  36 y/o female seen today for evaluation of a heart murmur. She was referred to [place] by Leota Sauers CNM.  The pt has a history of a heart murmur going back to childhood. When she was a teenager she had episodic weakness with exercise. She was evaluated by a pediatric cardiologist (Dr Clelia Croft). She doesn't think she had an echo. She does say she and her siblings were evaluated for Marfan's secondary to hyper flexibility and long arm span. She was seen recently by her OB GYN who suggested she see [place] as she felt her heart murmur was more prominent.  The pt has no unusual dyspnea. She has had some chest fullness when she lays flat at night, better when she sits up. Interestingly she says she drinks coffee during the day right up until an hour or so before bedtime. She had been a runner till last year when she injured her knee. She is recently re married and sold her house. She has two special needs children and her husband has one as well. She admits the transition has been stressful.    Current Outpatient Prescriptions  Medication Sig Dispense Refill  . fexofenadine (ALLEGRA) 180 MG tablet Take 180 mg by mouth daily.    Marland Kitchen MACA ROOT PO Take by mouth.    . Magnesium 250 MG TABS Take by mouth.    . Multiple Vitamins-Minerals (MULTIVITAMIN PO) Take by mouth daily.    . nitrofurantoin, macrocrystal-monohydrate, (MACROBID) 100 MG capsule Take one capsule post coital if symptoms persist take bid x 3 days and call office if not resolving 30 capsule 2  . Probiotic Product (PROBIOTIC PO) Take by mouth daily.    Marland Kitchen UNABLE TO FIND magnesium & b12 po    . valACYclovir (VALTREX) 500 MG tablet TAKE 1 TABLET BY MOUTH DAILY. FOR SUPPRESSION, INCREASE TO TWICE A DAY FOR 3 DAYS AT ONSET OF OUTBREAK 30 tablet 2   No current facility-administered medications for this visit.      Allergies  Allergen Reactions  . Other     LoLoestrin--made patient "feel depressed--suicidal"  . Quinolones     Rash   . Sulfa Antibiotics Swelling    "swelling of tongue, hives"    Past Medical History:  Diagnosis Date  . Abnormal Pap smear of cervix    2014 ASCUS HPV HR+, 2015 LSIL HPV HR+  . Depression   . GERD (gastroesophageal reflux disease)   . Heart murmur   . IBS (irritable bowel syndrome)   . Mitral valve prolapse   . MRSA (methicillin resistant Staphylococcus aureus)    infection on LT side of face, and forehead (02/2011)  . Nephrolithiasis   . Shingles   . UTI (urinary tract infection)     Social History   Social History  . Marital status: Married    Spouse name: N/A  . Number of children: N/A  . Years of education: N/A   Occupational History  . Not on file.   Social History Main Topics  . Smoking status: Never Smoker  . Smokeless tobacco: Never Used  . Alcohol use 0.0 oz/week     Comment: 1-2 a month  . Drug use: No  . Sexual activity: Yes    Partners: Male    Birth control/ protection: Surgical     Comment: Tubal   Other Topics Concern  .  Not on file   Social History Narrative  . No narrative on file     Family History  Problem Relation Age of Onset  . Diabetes Mother        Type 2  . Hypertension Mother   . Cancer Maternal Grandmother   . Breast cancer Maternal Grandmother 50  . Cancer Maternal Grandfather   . Diabetes Maternal Grandfather        Type 1   . Lung cancer Maternal Grandfather   . Heart attack Paternal Grandfather      Review of Systems: General: negative for chills, fever, night sweats or weight changes.  Cardiovascular: negative for chest pain, dyspnea on exertion, edema, orthopnea, palpitations, paroxysmal nocturnal dyspnea or shortness of breath Dermatological: negative for rash Respiratory: negative for cough or wheezing Urologic: negative for hematuria Abdominal: negative for nausea, vomiting,  diarrhea, bright red blood per rectum, melena, or hematemesis Neurologic: negative for visual changes, syncope, or dizziness All other systems reviewed and are otherwise negative except as noted above.    Blood pressure 112/68, pulse 67, height 5\' 4"  (1.626 m), weight 146 lb 12.8 oz (66.6 kg).  General appearance: alert, cooperative, no distress and long arms, small, moon face Neck: no carotid bruit and no JVD Lungs: clear to auscultation bilaterally Heart: regular rate and rhythm and she has a coarse 2/6 mid systolic murmur, loudest at the pulmonic area. preserved s2. Abdomen: soft, non-tender; bowel sounds normal; no masses,  no organomegaly Extremities: extremities normal, atraumatic, no cyanosis or edema and long arms, she can bend her thum back and nearly touch her wrist Pulses: 2+ and symmetric Skin: Skin color, texture, turgor normal. No rashes or lesions Neurologic: Grossly normal  EKG NSR  ASSESSMENT AND PLAN:   Cardiac murmur Systolic murmur on exam  Chest discomfort Vague mid sternal chest discomfort  Interstitial cystitis [facility]   PLAN  Check echo. F/U with Dr Royann Shivers after this.  Corine Shelter PA-C 08/29/2016 1:56 PM

## 2016-08-29 NOTE — Assessment & Plan Note (Signed)
Systolic murmur on exam 

## 2016-08-29 NOTE — Assessment & Plan Note (Signed)
Vague mid sternal chest discomfort

## 2016-08-29 NOTE — Assessment & Plan Note (Signed)
[facility] 

## 2016-09-02 ENCOUNTER — Ambulatory Visit (HOSPITAL_COMMUNITY): Payer: Managed Care, Other (non HMO) | Attending: Cardiology

## 2016-09-02 ENCOUNTER — Other Ambulatory Visit: Payer: Self-pay

## 2016-09-02 DIAGNOSIS — R011 Cardiac murmur, unspecified: Secondary | ICD-10-CM | POA: Diagnosis present

## 2016-09-02 DIAGNOSIS — N3010 Interstitial cystitis (chronic) without hematuria: Secondary | ICD-10-CM | POA: Diagnosis not present

## 2016-09-02 DIAGNOSIS — R0789 Other chest pain: Secondary | ICD-10-CM | POA: Insufficient documentation

## 2016-09-04 ENCOUNTER — Telehealth: Payer: Self-pay | Admitting: Cardiology

## 2016-09-04 NOTE — Telephone Encounter (Signed)
Mrs. Emily [name] is calling to find out her Echo results

## 2016-09-04 NOTE — Telephone Encounter (Signed)
Left a message for the patient to call back for echo results:  Notes recorded by Abelino DerrickKilroy, Luke K, PA-C on 09/04/2016 at 12:30 PM EDT Can you let the pt know her echo was completley normal. No cardiac source to explain that murmur. I would like to see her briefly in the office in the next week or two to check for subclavian stenosis and possibly set her up for an upper extremity doppler- you can double book her, it will only take 10-15 minutes.  Thanks,  Liberty MutualLuke

## 2016-09-04 NOTE — Telephone Encounter (Signed)
The patient has been given the results and an appointment has been made for 8/29 at 10:00. She verbalized her understanding.

## 2016-09-12 ENCOUNTER — Other Ambulatory Visit: Payer: Self-pay

## 2016-09-12 ENCOUNTER — Encounter (HOSPITAL_COMMUNITY): Payer: Self-pay

## 2016-09-12 ENCOUNTER — Telehealth: Payer: Self-pay | Admitting: Cardiology

## 2016-09-12 ENCOUNTER — Emergency Department (HOSPITAL_COMMUNITY): Payer: Managed Care, Other (non HMO)

## 2016-09-12 DIAGNOSIS — R946 Abnormal results of thyroid function studies: Secondary | ICD-10-CM | POA: Diagnosis not present

## 2016-09-12 DIAGNOSIS — R0789 Other chest pain: Secondary | ICD-10-CM | POA: Insufficient documentation

## 2016-09-12 DIAGNOSIS — Z5321 Procedure and treatment not carried out due to patient leaving prior to being seen by health care provider: Secondary | ICD-10-CM | POA: Insufficient documentation

## 2016-09-12 LAB — CBC
HEMATOCRIT: 38.3 % (ref 36.0–46.0)
HEMOGLOBIN: 13 g/dL (ref 12.0–15.0)
MCH: 28.6 pg (ref 26.0–34.0)
MCHC: 33.9 g/dL (ref 30.0–36.0)
MCV: 84.2 fL (ref 78.0–100.0)
Platelets: 234 10*3/uL (ref 150–400)
RBC: 4.55 MIL/uL (ref 3.87–5.11)
RDW: 12.6 % (ref 11.5–15.5)
WBC: 5.9 10*3/uL (ref 4.0–10.5)

## 2016-09-12 LAB — BASIC METABOLIC PANEL
ANION GAP: 7 (ref 5–15)
BUN: 12 mg/dL (ref 6–20)
CO2: 27 mmol/L (ref 22–32)
Calcium: 9 mg/dL (ref 8.9–10.3)
Chloride: 104 mmol/L (ref 101–111)
Creatinine, Ser: 0.75 mg/dL (ref 0.44–1.00)
GFR calc Af Amer: >60 mL/min (ref >60)
GFR calc non Af Amer: >60 mL/min (ref >60)
GLUCOSE: 87 mg/dL (ref 65–99)
POTASSIUM: 3.7 mmol/L (ref 3.5–5.1)
Sodium: 138 mmol/L (ref 135–145)

## 2016-09-12 LAB — I-STAT TROPONIN, ED: Troponin i, poc: 0.01 ng/mL (ref 0.00–0.08)

## 2016-09-12 NOTE — Telephone Encounter (Signed)
Pt arrived in ER 1315

## 2016-09-12 NOTE — ED Triage Notes (Signed)
Pt presents to the ed with complaints of central chest pressure that does not radiate. Pt also complains of severe fatigue and nausea. Pt is in no distress in triage.

## 2016-09-12 NOTE — ED Notes (Signed)
Patient updated on delays and wait time 

## 2016-09-12 NOTE — Telephone Encounter (Signed)
New message     Pt c/o of Chest Pain: STAT if CP now or developed within 24 hours  1. Are you having CP right now?  yes  2. Are you experiencing any other symptoms (ex. SOB, nausea, vomiting, sweating)? Nausea and feels "winded" 3. How long have you been experiencing CP?  About 2 weeks but worse today 4. Is your CP continuous or coming and going?  both  5. Have you taken Nitroglycerin?  no ?

## 2016-09-12 NOTE — Telephone Encounter (Signed)
S/w pt she states that she has been having intermittent chest pressure for about 2 weeks, today however it started before 8 am continuously until now(about noon). Her sx are chest tightness,and pressure(denies chest pain,SOB) very fatigued and anxious. Located in mid chest area. Due to these symptoms I have sent her to the ER. Pt verbalizes understanding.

## 2016-09-13 ENCOUNTER — Emergency Department (HOSPITAL_COMMUNITY)
Admission: EM | Admit: 2016-09-13 | Discharge: 2016-09-13 | Disposition: A | Discharge status: Home / Self Care | Payer: Managed Care, Other (non HMO) | Attending: Emergency Medicine | Admitting: Emergency Medicine

## 2016-09-13 DIAGNOSIS — R0789 Other chest pain: Secondary | ICD-10-CM

## 2016-09-13 DIAGNOSIS — R946 Abnormal results of thyroid function studies: Secondary | ICD-10-CM

## 2016-09-13 LAB — I-STAT TROPONIN, ED: TROPONIN I, POC: 0 ng/mL (ref 0.00–0.08)

## 2016-09-13 LAB — URINALYSIS, ROUTINE W REFLEX MICROSCOPIC
BILIRUBIN URINE: NEGATIVE
GLUCOSE, UA: NEGATIVE mg/dL
HGB URINE DIPSTICK: NEGATIVE
KETONES UR: NEGATIVE mg/dL
LEUKOCYTES UA: NEGATIVE
Nitrite: NEGATIVE
PH: 6 (ref 5.0–8.0)
PROTEIN: NEGATIVE mg/dL
Specific Gravity, Urine: 1.023 (ref 1.005–1.030)

## 2016-09-13 LAB — HEPATIC FUNCTION PANEL
ALK PHOS: 53 U/L (ref 38–126)
ALT: 18 U/L (ref 14–54)
AST: 18 U/L (ref 15–41)
Albumin: 3.8 g/dL (ref 3.5–5.0)
BILIRUBIN TOTAL: 0.6 mg/dL (ref 0.3–1.2)
Bilirubin, Direct: <0.1 mg/dL — ABNORMAL LOW (ref 0.1–0.5)
TOTAL PROTEIN: 7.1 g/dL (ref 6.5–8.1)

## 2016-09-13 LAB — T4, FREE: FREE T4: 1.33 ng/dL — AB (ref 0.61–1.12)

## 2016-09-13 LAB — BRAIN NATRIURETIC PEPTIDE: B NATRIURETIC PEPTIDE 5: 6.7 pg/mL (ref 0.0–100.0)

## 2016-09-13 LAB — PREGNANCY, URINE: Preg Test, Ur: NEGATIVE

## 2016-09-13 LAB — TSH: TSH: 3.411 u[IU]/mL (ref 0.350–4.500)

## 2016-09-13 LAB — LIPASE, BLOOD: Lipase: 24 U/L (ref 11–51)

## 2016-09-13 MED ORDER — PANTOPRAZOLE SODIUM 20 MG PO TBEC: 20.0000 mg | DELAYED_RELEASE_TABLET | Freq: Every day | ORAL | 0 refills | Status: DC

## 2016-09-13 MED ORDER — GI COCKTAIL ~~LOC~~
30.0000 mL | Freq: Once | ORAL | Status: AC
  Administered 2016-09-13: 30 mL via ORAL
  Filled 2016-09-13: qty 30

## 2016-09-13 MED ORDER — ATENOLOL 25 MG PO TABS: 12.5000 mg | ORAL_TABLET | Freq: Every day | ORAL | 0 refills | Status: DC

## 2016-09-13 NOTE — ED Provider Notes (Signed)
[facility]-EMERGENCY DEPT Provider Note   CSN: [id number] Arrival date & time: 09/12/16  1315   By signing my name below, I, Clarisse Gouge, attest that this documentation has been prepared under the direction and in the presence of Loren Racer, MD. Electronically signed, Clarisse Gouge, ED Scribe. 09/13/16. 12:47 AM.   History   Chief Complaint Chief Complaint  Patient presents with  . Chest Pain   The history is provided by the patient and medical records. No language interpreter was used.    Emily [name] is a 36 y.o. female with h/o heart murmur, GERD presenting to the Emergency Department concerning significant worsening of recurrent chest pressure onset within the past 24 hours. Pt states this has been a worsening issue x 3 weeks. SOB on exertion, tremors and anxiety associated. She describes moderate to severe fluctuating chest pressure. Pt told to report to [facility] by her cardiologist yesterday afternoon. Pt evaluated by her cardiologist for this issue yesterday when an EKG and Echo cardiogram were performed with inconclusive results. She states she has a "dye test" 1.5 week from now for F/U. Pt states she was recently advised to start taking vitamin D supplements and she decided to discontinue using it because she suspected that her symptoms came on around the time she began taking it. Long distance travel noted 6 weeks ago. Social alcohol use. LNMP last week; tubal ligation reported. No known h/o other heart disorders. Pt's paternal grandfather died early of a heart attack; her maternal grandfather had a heart attack in his mid-50's. Pt states she recently decreased to 1 caffeineated beverage from 2 beverages daily. Some foot swelling recently, but none with presenting symptoms. No SOB, foot swelling, dietary changes, weight change,  or any other complaints noted at this time.   Past Medical History:  Diagnosis Date  . Abnormal Pap smear of cervix    2014 ASCUS HPV HR+, 2015 LSIL HPV HR+    . Depression   . GERD (gastroesophageal reflux disease)   . Heart murmur   . IBS (irritable bowel syndrome)   . Mitral valve prolapse   . MRSA (methicillin resistant Staphylococcus aureus)    infection on LT side of face, and forehead (02/2011)  . Nephrolithiasis   . Shingles   . UTI (urinary tract infection)     Patient Active Problem List   Diagnosis Date Noted  . Chest discomfort 08/29/2016  . Cardiac murmur 05/10/2014  . Allergic rhinitis 05/10/2014  . 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 Interstitial cystitis 04/25/2014    Past Surgical History:  Procedure Laterality Date  . CESAREAN SECTION  2007 & 2011   x2  . COLPOSCOPY  [id number]  . ESOPHAGOGASTRODUODENOSCOPY  03/25/2011   Procedure: ESOPHAGOGASTRODUODENOSCOPY (EGD);  Surgeon: Barrie Folk, MD;  Location: [facility] ENDOSCOPY;  Service: Endoscopy;  Laterality: N/A;  . SAVORY DILATION  03/25/2011   Procedure: SAVORY DILATION;  Surgeon: Barrie Folk, MD;  Location: [facility] ENDOSCOPY;  Service: Endoscopy;  Laterality: N/A;  . TUBAL LIGATION  2011  . WISDOM TOOTH EXTRACTION  2003    OB History    Gravida Para Term Preterm AB Living   2 2 2     2    SAB TAB Ectopic Multiple Live Births           2       Home Medications    Prior to Admission medications   Medication Sig Start Date End Date Taking? Authorizing Provider  fexofenadine (ALLEGRA) 180 MG tablet Take 180  mg by mouth daily.   Yes [provider]  GLYCINE PO Take 1 tablet by mouth daily.   Yes [provider]  MACA ROOT PO Take 3 tablets by mouth See admin instructions. Takes everyday except when on period   Yes [provider]  Magnesium 250 MG TABS Take 1 tablet by mouth daily.    Yes [provider]  Probiotic Product (PROBIOTIC PO) Take 1 tablet by mouth daily.    Yes [provider]  UNABLE TO FIND Take 1 tablet by mouth daily. magnesium & b12 po   Yes [provider]  valACYclovir (VALTREX) 500 MG tablet TAKE 1  TABLET BY MOUTH DAILY. FOR SUPPRESSION, INCREASE TO TWICE A DAY FOR 3 DAYS AT ONSET OF OUTBREAK 03/23/16  Yes Verner Chol, CNM  atenolol (TENORMIN) 25 MG tablet Take 0.5 tablets (12.5 mg total) by mouth daily. 09/13/16   Loren Racer, MD  pantoprazole (PROTONIX) 20 MG tablet Take 1 tablet (20 mg total) by mouth daily. 09/13/16   Loren Racer, MD    Family History Family History  Problem Relation Age of Onset  . Diabetes Mother        Type 2  . Hypertension Mother   . Cancer Maternal Grandmother   . Breast cancer Maternal Grandmother 50  . Cancer Maternal Grandfather   . Diabetes Maternal Grandfather        Type 1   . Lung cancer Maternal Grandfather   . Heart attack Paternal Grandfather     Social History Social History  Substance Use Topics  . Smoking status: Never Smoker  . Smokeless tobacco: Never Used  . Alcohol use 0.0 oz/week     Comment: 1-2 a month     Allergies   Other; Quinolones; and Sulfa antibiotics   Review of Systems Review of Systems  Constitutional: Positive for diaphoresis and fatigue. Negative for chills and fever.  HENT: Negative for sore throat, trouble swallowing and voice change.   Eyes: Negative for visual disturbance.  Respiratory: Positive for chest tightness and shortness of breath. Negative for cough.   Cardiovascular: Negative for chest pain, palpitations and leg swelling.  Gastrointestinal: Negative for abdominal pain, diarrhea, nausea and vomiting.  Genitourinary: Negative for difficulty urinating, dysuria, flank pain, frequency, hematuria, pelvic pain, vaginal bleeding and vaginal discharge.  Musculoskeletal: Negative for back pain, myalgias, neck pain and neck stiffness.  Skin: Negative for rash and wound.  Neurological: Positive for tremors. Negative for dizziness, syncope, weakness, light-headedness, numbness and headaches.  Psychiatric/Behavioral: The patient is nervous/anxious.   All other systems reviewed and are  negative.    Physical Exam Updated Vital Signs BP 104/84 (BP Location: Right Arm)   Pulse 80   Temp 98 F (36.7 C) (Oral)   Resp 16   LMP 09/05/2016 Comment: pt also states that her tubes are tied   SpO2 100%   Physical Exam  Constitutional: She is oriented to person, place, and time. She appears well-developed and well-nourished.  Anxious appearing  HENT:  Head: Normocephalic and atraumatic.  Mouth/Throat: Oropharynx is clear and moist. No oropharyngeal exudate.  Eyes: Pupils are equal, round, and reactive to light. EOM are normal.  Neck: Normal range of motion. Neck supple. Thyromegaly present.  Mildly enlarged thyroid right greater than left.  Cardiovascular: Normal rate and regular rhythm.  Exam reveals no gallop and no friction rub.   No murmur heard. Pulmonary/Chest: Effort normal and breath sounds normal. No respiratory distress. She has no wheezes.  She has no rales. She exhibits no tenderness.  Abdominal: Soft. Bowel sounds are normal. There is no tenderness. There is no rebound and no guarding.  Musculoskeletal: Normal range of motion. She exhibits no edema or tenderness.  No midline thoracic or lumbar tenderness. No CVA tenderness. No lower extremity swelling, asymmetry or tenderness. 2+ distal pulses in all extremities.  Neurological: She is alert and oriented to person, place, and time.  Patient is alert and oriented x3 with clear, goal oriented speech. Patient has 5/5 motor in all extremities. Sensation is intact to light touch. Fine tremor noted. Patellar DTRs are hyperreflexic.   Skin: Skin is warm and dry. Capillary refill takes less than 2 seconds. No rash noted. No erythema.  Psychiatric: She has a normal mood and affect. Her behavior is normal.  Nursing note and vitals reviewed.    ED Treatments / Results  DIAGNOSTIC STUDIES: Oxygen Saturation is 100% on RA, NL by my interpretation.    COORDINATION OF CARE: 12:47 AM-Discussed next steps with pt. Pt  verbalized understanding and is agreeable with the plan. Will order blood work and GI cocktail.   Labs (all labs ordered are listed, but only abnormal results are displayed) Labs Reviewed  HEPATIC FUNCTION PANEL - Abnormal; Notable for the following:       Result Value   Bilirubin, Direct <0.1 (*)    All other components within normal limits  T4, FREE - Abnormal; Notable for the following:    Free T4 1.33 (*)    All other components within normal limits  BASIC METABOLIC PANEL  CBC  LIPASE, BLOOD  URINALYSIS, ROUTINE W REFLEX MICROSCOPIC  PREGNANCY, URINE  TSH  BRAIN NATRIURETIC PEPTIDE  I-STAT TROPONIN, ED  I-STAT TROPONIN, ED    EKG  EKG Interpretation  Date/Time:  Friday September 12 2016 13:34:11 EDT Ventricular Rate:  70 PR Interval:  132 QRS Duration: 80 QT Interval:  386 QTC Calculation: 416 R Axis:   63 Text Interpretation:  Normal sinus rhythm Normal ECG Confirmed by Ranae Palms  MD, Piers Baade ([id number]) on 09/13/2016 12:27:14 AM Also confirmed by Ranae Palms  MD, Mio Schellinger ([id number])  on 09/13/2016 2:05:06 AM       Radiology No results found.  Procedures Procedures (including critical care time)  Medications Ordered in ED Medications  gi cocktail (Maalox,Lidocaine,Donnatal) (30 mLs Oral Given 09/13/16 0138)     Initial Impression / Assessment and Plan / ED Course  I have reviewed the triage vital signs and the nursing notes.  Pertinent labs & imaging results that were available during my care of the patient were reviewed by me and considered in my medical decision making (see chart for details).     Negative work up for ACS. Patient does have thyroid hormone irregularities which was just her symptoms are due to hyperthyroidism. We'll start on low-dose of beta blocker and advise close follow-up with her primary physician. She may need referral to an endocrinologist for cardiologist. Return precautions given.  Final Clinical Impressions(s) / ED Diagnoses   Final diagnoses:   Atypical chest pain  Thyroid function test abnormal    New Prescriptions Discharge Medication List as of 09/13/2016  3:39 AM    START taking these medications   Details  atenolol (TENORMIN) 25 MG tablet Take 0.5 tablets (12.5 mg total) by mouth daily., Starting Sat 09/13/2016, Print    pantoprazole (PROTONIX) 20 MG tablet Take 1 tablet (20 mg total) by mouth daily., Starting Sat 09/13/2016, Print      I  personally performed the services described in this documentation, which was scribed in my presence. The recorded information has been reviewed and is accurate.      Loren Racer, MD 09/19/16 1302

## 2016-09-13 NOTE — ED Notes (Signed)
Pt departed in NAD, refused use of wheelchair.  

## 2016-09-23 LAB — TSH: TSH: 1.45 (ref 0.41–5.90)

## 2016-09-24 ENCOUNTER — Telehealth (HOSPITAL_COMMUNITY): Payer: Self-pay

## 2016-09-24 ENCOUNTER — Encounter: Payer: Self-pay | Admitting: Cardiology

## 2016-09-24 ENCOUNTER — Ambulatory Visit (INDEPENDENT_AMBULATORY_CARE_PROVIDER_SITE_OTHER): Payer: Managed Care, Other (non HMO) | Admitting: Cardiology

## 2016-09-24 ENCOUNTER — Other Ambulatory Visit: Payer: Self-pay | Admitting: Certified Nurse Midwife

## 2016-09-24 VITALS — BP 108/66 | HR 73 | Ht 64.5 in | Wt 147.0 lb

## 2016-09-24 DIAGNOSIS — R0789 Other chest pain: Secondary | ICD-10-CM

## 2016-09-24 DIAGNOSIS — R079 Chest pain, unspecified: Secondary | ICD-10-CM | POA: Diagnosis not present

## 2016-09-24 DIAGNOSIS — R011 Cardiac murmur, unspecified: Secondary | ICD-10-CM

## 2016-09-24 NOTE — Patient Instructions (Signed)
Medication Instructions: Your physician recommends that you continue on your current medications as directed. Please refer to the Current Medication list given to you today.  If you need a refill on your cardiac medications before your next appointment, please call your pharmacy.   Labwork: Your physician recommends that you have the following lab drawn today: D Dimer   Procedures/Testing: Your physician has requested that you have an exercise tolerance test. For further information please visit [email or web address] This will take place at [address] [facility], [address].  Follow-Up: Your physician wants you to follow-up in: 2 weeks with Dr. Royann Shivers or Dr. Duke Salvia.   Special Instructions:  Please take Alleve for the next five days.   Thank you for choosing [facility]!!

## 2016-09-24 NOTE — Telephone Encounter (Signed)
Medication refill request: Valtrex Last AEX:  08/06/16 DL Next AEX: none Last MMG (if hormonal medication request): none Refill authorized: 03/23/16 #[id number]. Today please advise.

## 2016-09-24 NOTE — Assessment & Plan Note (Signed)
Scratchy systolic murmur LSB- completely normal echo

## 2016-09-24 NOTE — Assessment & Plan Note (Signed)
Pt continues to have chest pain "pressure",  exercise dyspnea, and generalized fatigue. These symptoms started about 6 weeks ago.

## 2016-09-24 NOTE — Telephone Encounter (Signed)
Encounter complete. 

## 2016-09-24 NOTE — Progress Notes (Signed)
09/24/2016 Emily [name]   [birth year]  [id number]  Primary Physician Milus Heightedmon, Noelle, PA-C Primary Cardiologist: New-(she had an apt with Dr Salena Saner 9/20 but this was cancelled by scheduling-?)  HPI:  36 y/o female seen by me as a new cardiac evaluation 08/29/16 for evaluation of a heart murmur. She was referred to us by Leota Sauerseborah Leonard CNM.  The pt has a history of a heart murmur going back to childhood. When she was a teenager she had episodic weakness with exercise. She was evaluated by a pediatric cardiologist (Dr Clelia CroftShaw). She does say she and her siblings were evaluated for Marfan's secondary to hyper flexibility and long arm span. She was seen recently by her OB GYN who suggested she see us as she felt her heart murmur was more prominent. She had been a runner till last year when she injured her knee. She is recently re married and sold her house. She has two special needs children and her husband has one as well. She admits the transition has been stressful.   When I saw her she had a coarse 2/6 systolic murmur at pulmonic area. An echo done 09/02/16 was completley normal. I asked the pt to come back so I could evaluate her for possible Lt SCA stenosis. In the interm she has had complaints of chest "pressure" and dyspnea on exertion. She was seen in the ED 09/13/16. She had this chest pressure when I saw her but says it had gotten better after she was placed on low dose beta blocker by her PCP. She feels like something is wrong and does not think her symptoms are from "stress". She denies any positional component to her chest pain, no fever chills or hemoptysis. Her palpations have not been bothersome.  TSH has been WNL on two occasions this year.    Current Outpatient Prescriptions  Medication Sig Dispense Refill  . atenolol (TENORMIN) 25 MG tablet Take 0.5 tablets (12.5 mg total) by mouth daily. 30 tablet 0  . fexofenadine (ALLEGRA) 180 MG tablet Take 180 mg by mouth daily.    Marland Kitchen. GLYCINE PO Take 1  tablet by mouth daily.    Marland Kitchen. MACA ROOT PO Take 3 tablets by mouth See admin instructions. Takes everyday except when on period    . Magnesium 250 MG TABS Take 1 tablet by mouth daily.     . pantoprazole (PROTONIX) 20 MG tablet Take 1 tablet (20 mg total) by mouth daily. 30 tablet 0  . Probiotic Product (PROBIOTIC PO) Take 1 tablet by mouth daily.     Marland Kitchen. UNABLE TO FIND Take 1 tablet by mouth daily. magnesium & b12 po    . valACYclovir (VALTREX) 500 MG tablet TAKE 1 TABLET BY MOUTH DAILY. FOR SUPPRESSION, INCREASE TO TWICE A DAY FOR 3 DAYS AT ONSET OF OUTBREAK 30 tablet 2   No current facility-administered medications for this visit.     Allergies  Allergen Reactions  . Other     LoLoestrin--made patient "feel depressed--suicidal"  . Quinolones     Rash   . Sulfa Antibiotics Swelling    "swelling of tongue, hives"    Past Medical History:  Diagnosis Date  . Abnormal Pap smear of cervix    2014 ASCUS HPV HR+, 2015 LSIL HPV HR+  . Depression   . GERD (gastroesophageal reflux disease)   . Heart murmur   . IBS (irritable bowel syndrome)   . Mitral valve prolapse   . MRSA (methicillin resistant Staphylococcus aureus)  infection on LT side of face, and forehead (02/2011)  . Nephrolithiasis   . Shingles   . UTI (urinary tract infection)     Social History   Social History  . Marital status: Married    Spouse name: N/A  . Number of children: N/A  . Years of education: N/A   Occupational History  . Not on file.   Social History Main Topics  . Smoking status: Never Smoker  . Smokeless tobacco: Never Used  . Alcohol use 0.0 oz/week     Comment: 1-2 a month  . Drug use: No  . Sexual activity: Yes    Partners: Male    Birth control/ protection: Surgical     Comment: Tubal   Other Topics Concern  . Not on file   Social History Narrative  . No narrative on file     Family History  Problem Relation Age of Onset  . Diabetes Mother        Type 2  . Hypertension  Mother   . Cancer Maternal Grandmother   . Breast cancer Maternal Grandmother 50  . Cancer Maternal Grandfather   . Diabetes Maternal Grandfather        Type 1   . Lung cancer Maternal Grandfather   . Heart attack Paternal Grandfather      Review of Systems: General: negative for chills, fever, night sweats or weight changes.  Cardiovascular: negative for dema, orthopnea, palpitations, paroxysmal nocturnal dyspnea  Dermatological: negative for rash Respiratory: negative for cough or wheezing Urologic: negative for hematuria Abdominal: negative for nausea, vomiting, diarrhea, bright red blood per rectum, melena, or hematemesis Neurologic: negative for visual changes, syncope, or dizziness All other systems reviewed and are otherwise negative except as noted above.    Blood pressure 108/66, pulse 73, height 5' 4.5" (1.638 m), weight 147 lb (66.7 kg), last menstrual period 09/05/2016.    General appearance: alert, cooperative, no distress and long arms, small, moon face Neck: no carotid bruit and no JVD Lungs: clear to auscultation bilaterally Heart: regular rate and rhythm and she has a coarse 2/6 mid systolic murmur, loudest at the pulmonic area. preserved s2. Abdomen: soft, non-tender; bowel sounds normal; no masses,  no organomegaly Extremities: extremities normal, atraumatic, no cyanosis or edema and long arms, she can bend her thum back and nearly touch her wrist Pulses: 2+ and symmetric Skin: Skin color, texture, turgor normal. No rashes or lesions Neurologic: Grossly normal   EKG NSR  ASSESSMENT AND PLAN:   Chest discomfort Pt continues to have chest pain "pressure",  exercise dyspnea, and generalized fatigue. These symptoms started about 6 weeks ago.  Cardiac murmur Scratchy systolic murmur LSB- completely normal echo   PLAN  Check D-dimer and POET. I asked her to try Aleve BID for 5 days in case she has a mild case of pericarditis though her chest pain was not  positional and she had no pericardial effusion on exam. F/U with MD in 2 weeks.   Corine Shelter PA-C 09/24/2016 10:49 AM

## 2016-09-25 ENCOUNTER — Ambulatory Visit (HOSPITAL_COMMUNITY)
Admission: RE | Admit: 2016-09-25 | Discharge: 2016-09-25 | Disposition: A | Discharge status: Home / Self Care | Payer: Managed Care, Other (non HMO) | Source: Ambulatory Visit | Attending: Cardiology | Admitting: Cardiology

## 2016-09-25 DIAGNOSIS — R079 Chest pain, unspecified: Secondary | ICD-10-CM | POA: Diagnosis present

## 2016-09-25 LAB — EXERCISE TOLERANCE TEST
Estimated workload: 13.4 METS
Exercise duration (min): 11 min
Exercise duration (sec): 3 s
MPHR: 187 {beats}/min
Peak HR: 187 {beats}/min
Percent HR: 100 %
RPE: 17
Rest HR: 75 {beats}/min

## 2016-09-25 LAB — D-DIMER, QUANTITATIVE: D-DIMER: 0.48 mg/L FEU (ref 0.00–0.49)

## 2016-10-09 ENCOUNTER — Encounter: Payer: Self-pay | Admitting: Cardiovascular Disease

## 2016-10-09 ENCOUNTER — Ambulatory Visit (INDEPENDENT_AMBULATORY_CARE_PROVIDER_SITE_OTHER): Payer: Managed Care, Other (non HMO) | Admitting: Cardiovascular Disease

## 2016-10-09 VITALS — BP 118/85 | HR 83 | Ht 64.5 in | Wt 148.0 lb

## 2016-10-09 DIAGNOSIS — M791 Myalgia, unspecified site: Secondary | ICD-10-CM

## 2016-10-09 DIAGNOSIS — R011 Cardiac murmur, unspecified: Secondary | ICD-10-CM

## 2016-10-09 DIAGNOSIS — R531 Weakness: Secondary | ICD-10-CM | POA: Diagnosis not present

## 2016-10-09 NOTE — Progress Notes (Signed)
Cardiology Office Note   Date:  10/09/2016   ID:  Emily [name], DOB 03-Jul-1980, MRN [id number]  PCP:  Thomasene Lot, DO  Cardiologist:   Chilton Si, MD   Chief Complaint  Patient presents with  . Follow-up    2 weeks;  . Chest Pain    chest pressure.  . Dizziness    consistent for a month      History of Present Illness: Emily [name] is a 36 y.o. female with GERD and a physiologic heart murmur who presents for follow up.  She saw Lossie Faes, PA-C, on 08/2016 for a heart murmur.  She was referred for an echo 09/02/16 that revealed LVEF 60-65% and was otherwise unremarkable.  She was seen in the ED for chest pain and had an ETT 09/25/16 that was negative for ischemia.  She achieved 13.4 METS on a Bruce protocol.  She reports three months of chest pressure that feels like something is pressing from her chest and into her back.  The discomfort is constantly there and ranges from 1-5 in severity. There is no associated shortness of breath.  The symptoms are constant but get worse throughout the day.  She also reports aching in her arms and legs.  She feels constantly fatigued.  In the past she was able to run for exercise but now can barely get up. She has a hard time even holding up her arms to do her hair.  She has daily dizziness that is sometimes worse with exertion.  She doesn't feel as though the room is spinning or like she will pass out.  It feels more like she is intoxicated and unsteady.  She denies fever or chills but has been much more hot and diaphoretic lately.  She denies diplopia.  The symptoms do seem to get a little worse throughout the day and with repetitive movement.     Past Medical History:  Diagnosis Date  . Abnormal Pap smear of cervix    2014 ASCUS HPV HR+, 2015 LSIL HPV HR+  . Depression   . GERD (gastroesophageal reflux disease)   . Heart murmur   . IBS (irritable bowel syndrome)   . Mitral valve prolapse   . MRSA (methicillin resistant  Staphylococcus aureus)    infection on LT side of face, and forehead (02/2011)  . Nephrolithiasis   . Shingles   . UTI (urinary tract infection)     Past Surgical History:  Procedure Laterality Date  . CESAREAN SECTION  2007 & 2011   x2  . COLPOSCOPY  [id number]  . ESOPHAGOGASTRODUODENOSCOPY  03/25/2011   Procedure: ESOPHAGOGASTRODUODENOSCOPY (EGD);  Surgeon: Barrie Folk, MD;  Location[facility] ENDOSCOPY;  Service: Endoscopy;  Laterality: N/A;  . SAVORY DILATION  03/25/2011   Procedure: SAVORY DILATION;  Surgeon: Barrie Folk, MD;  Location: [facility] ENDOSCOPY;  Service: Endoscopy;  Laterality: N/A;  . TUBAL LIGATION  2011  . WISDOM TOOTH EXTRACTION  2003     Current Outpatient Prescriptions  Medication Sig Dispense Refill  . atenolol (TENORMIN) 25 MG tablet Take 0.5 tablets (12.5 mg total) by mouth daily. 30 tablet 0  . Cholecalciferol (VITAMIN D PO) Take 100 Units by mouth daily.    . fexofenadine (ALLEGRA) 180 MG tablet Take 180 mg by mouth daily.    Marland Kitchen GLYCINE PO Take 1 tablet by mouth daily.    Marland Kitchen MACA ROOT PO Take 3 tablets by mouth See admin instructions. Takes everyday except when on period    .  Magnesium 250 MG TABS Take 1 tablet by mouth daily.     . pantoprazole (PROTONIX) 20 MG tablet Take 1 tablet (20 mg total) by mouth daily. 30 tablet 0  . Probiotic Product (PROBIOTIC PO) Take 1 tablet by mouth daily.     Marland Kitchen UNABLE TO FIND Take 1 tablet by mouth daily. magnesium & b12 po    . valACYclovir (VALTREX) 500 MG tablet TAKE 1 TABLET BY MOUTH DAILY. FOR SUPPRESSION, INCREASE TO TWICE A DAY FOR 3 DAYS AT ONSET OF OUTBREAK 30 tablet 1   No current facility-administered medications for this visit.     Allergies:   Other; Quinolones; and Sulfa antibiotics    Social History:  The patient  reports that she has never smoked. She has never used smokeless tobacco. She reports that she drinks alcohol. She reports that she does not use drugs.   Family History:  The patient's family history  includes Arthritis in her father; Breast cancer (age of onset: 36) in her maternal grandmother; Cancer in her maternal grandfather and maternal grandmother; Diabetes in her maternal grandfather and mother; Heart attack in her paternal grandfather; Lung cancer in her maternal grandfather.    ROS:  Please see the history of present illness.   Otherwise, review of systems are positive for none.   All other systems are reviewed and negative.    PHYSICAL EXAM: VS:  BP 118/85   Pulse 83   Ht 5' 4.5" (1.638 m)   Wt 67.1 kg (148 lb)   BMI 25.01 kg/m  , BMI Body mass index is 25.01 kg/m. GENERAL:  Well appearing.  No acute distress.   HEENT:  Pupils equal round and reactive, fundi not visualized, oral mucosa unremarkable NECK:  No jugular venous distention, waveform within normal limits, carotid upstroke brisk and symmetric, no bruits, no thyromegaly LYMPHATICS:  No cervical adenopathy LUNGS:  Clear to auscultation bilaterally HEART:  RRR.  PMI not displaced or sustained,S1 and S2 within normal limits, no S3, no S4, no clicks, no rubs, no murmurs ABD:  Flat, positive bowel sounds normal in frequency in pitch, no bruits, no rebound, no guarding, no midline pulsatile mass, no hepatomegaly, no splenomegaly EXT:  2 plus pulses throughout, no edema, no cyanosis no clubbing SKIN:  No rashes no nodules NEURO:  Cranial nerves II through XII grossly intact, Strength 5/5 bilateral UE and LE with 4/5 in bilateral UE from elbows to fingers.  Sensation in tact to LT throughout.  PSYCH:  Cognitively intact, oriented to person place and time   EKG:  EKG is not ordered today.  Echo 09/02/16: Study Conclusions  - Left ventricle: The cavity size was normal. Systolic function was   normal. The estimated ejection fraction was in the range of 60%   to 65%. Wall motion was normal; there were no regional wall   motion abnormalities. Left ventricular diastolic function   parameters were normal. - Pulmonic valve:  There was trivial regurgitation. - Pulmonary arteries: Systolic pressure could not be accurately   estimated.  ETT 09/25/16:  Blood pressure demonstrated a normal response to exercise.  Upsloping ST segment depression ST segment depression was noted during stress.  Excellent exercise capacity.  Negative, adequate stress test.     Recent Labs: 09/12/2016: BUN 12; Creatinine, Ser 0.75; Hemoglobin 13.0; Platelets 234; Potassium 3.7; Sodium 138 09/13/2016: ALT 18; B Natriuretic Peptide 6.7; TSH 3.411    Lipid Panel    Component Value Date/Time   CHOL 147 08/06/2016 1133  TRIG 53 08/06/2016 1133   HDL 62 08/06/2016 1133   CHOLHDL 2.4 08/06/2016 1133   LDLCALC 74 08/06/2016 1133      Wt Readings from Last 3 Encounters:  10/09/16 67.1 kg (148 lb)  09/24/16 66.7 kg (147 lb)  08/29/16 66.6 kg (146 lb 12.8 oz)      ASSESSMENT AND PLAN:  # Atypical chest pain:  # Fatigue: # gait instability: # Myalgia: Ms. Jovita [name] had a normal stress test and her echo was unremarkable.  I think that her symptoms are likely related to a systemic process.  She did have some mild weakness in bilateral distal upper extremities.  This may be either a neurologic or rheumatoloogic process.  Consider Myasthenia gravis, MS, SLE, polymyositis or dermatomyositis.  We will start the work up but also refer to Neuro and Rheum.  She is in the process of getting a new PCP.  Will check anti acetylcholine antibodies, a U/A, ANA, ESR, antiphospholipid antibodies and C3/C4.   # Murmur: Physiologic.  Echo was unremarkable and there is no evidence of heart failure on eam.   Current medicines are reviewed at length with the patient today.  The patient does not have concerns regarding medicines.  The following changes have been made:  no change  Labs/ tests ordered today include:   Orders Placed This Encounter  Procedures  . Acetylcholine Receptor, Binding  . Urinalysis  . Sed Rate (ESR)  . ANA Direct  w/Reflex if Positive  . C3 and C4  . Antiphospholipid Syndrome Comp  . Ambulatory referral to Neurology  . Ambulatory referral to Rheumatology     Disposition:   FU with Cecelia Graciano C. Duke Salvia, MD, [place] as needed.     This note was written with the assistance of speech recognition software.  Please excuse any transcriptional errors.  Signed, Rubi Tooley C. Duke Salvia, MD, [facility]  10/09/2016 3:52 PM    [facility]

## 2016-10-09 NOTE — Patient Instructions (Addendum)
Medication Instructions:  Your physician recommends that you continue on your current medications as directed. Please refer to the Current Medication list given to you today.  Labwork: Today   Testing/Procedures: none  Follow-Up: As needed   Any Other Special Instructions Will Be Listed Below (If Applicable). You have been referred to Neurology, if you do not hear from the office you may call them directly at  [phone]  You have been referred to Dr Corliss Skainseveshwar, if you do not hear from the office you may call them directly at  [phone]  If you need a refill on your cardiac medications before your next appointment, please call your pharmacy.

## 2016-10-10 LAB — URINALYSIS
Bilirubin, UA: NEGATIVE
GLUCOSE, UA: NEGATIVE
KETONES UA: NEGATIVE
Leukocytes, UA: NEGATIVE
NITRITE UA: NEGATIVE
Protein, UA: NEGATIVE
RBC UA: NEGATIVE
SPEC GRAV UA: 1.018 (ref 1.005–1.030)
Urobilinogen, Ur: 0.2 mg/dL (ref 0.2–1.0)
pH, UA: 6 (ref 5.0–7.5)

## 2016-10-10 LAB — SEDIMENTATION RATE: Sed Rate: 13 mm/hr (ref 0–32)

## 2016-10-10 LAB — ANA W/REFLEX IF POSITIVE: Anti Nuclear Antibody(ANA): NEGATIVE

## 2016-10-10 LAB — C3 AND C4
COMPLEMENT C3, SERUM: 148 mg/dL (ref 82–167)
Complement C4, Serum: 32 mg/dL (ref 14–44)

## 2016-10-10 LAB — ACETYLCHOLINE RECEPTOR, BINDING

## 2016-10-13 ENCOUNTER — Ambulatory Visit: Payer: Managed Care, Other (non HMO) | Admitting: Cardiovascular Disease

## 2016-10-13 ENCOUNTER — Encounter: Payer: Self-pay | Admitting: Family Medicine

## 2016-10-13 ENCOUNTER — Ambulatory Visit (INDEPENDENT_AMBULATORY_CARE_PROVIDER_SITE_OTHER): Payer: Managed Care, Other (non HMO) | Admitting: Family Medicine

## 2016-10-13 ENCOUNTER — Other Ambulatory Visit: Payer: Self-pay | Admitting: Neurology

## 2016-10-13 ENCOUNTER — Encounter: Payer: Self-pay | Admitting: Neurology

## 2016-10-13 ENCOUNTER — Ambulatory Visit (INDEPENDENT_AMBULATORY_CARE_PROVIDER_SITE_OTHER): Payer: Managed Care, Other (non HMO) | Admitting: Neurology

## 2016-10-13 VITALS — BP 115/77 | HR 88 | Ht 64.5 in | Wt 148.7 lb

## 2016-10-13 VITALS — BP 108/80 | HR 90 | Ht 64.5 in | Wt 148.1 lb

## 2016-10-13 DIAGNOSIS — F411 Generalized anxiety disorder: Secondary | ICD-10-CM

## 2016-10-13 DIAGNOSIS — R42 Dizziness and giddiness: Secondary | ICD-10-CM | POA: Diagnosis not present

## 2016-10-13 DIAGNOSIS — R6889 Other general symptoms and signs: Secondary | ICD-10-CM

## 2016-10-13 DIAGNOSIS — R5383 Other fatigue: Secondary | ICD-10-CM

## 2016-10-13 DIAGNOSIS — R0789 Other chest pain: Secondary | ICD-10-CM

## 2016-10-13 DIAGNOSIS — Z818 Family history of other mental and behavioral disorders: Secondary | ICD-10-CM

## 2016-10-13 DIAGNOSIS — N3010 Interstitial cystitis (chronic) without hematuria: Secondary | ICD-10-CM

## 2016-10-13 DIAGNOSIS — R202 Paresthesia of skin: Secondary | ICD-10-CM | POA: Insufficient documentation

## 2016-10-13 DIAGNOSIS — R292 Abnormal reflex: Secondary | ICD-10-CM

## 2016-10-13 DIAGNOSIS — R011 Cardiac murmur, unspecified: Secondary | ICD-10-CM

## 2016-10-13 DIAGNOSIS — M79602 Pain in left arm: Secondary | ICD-10-CM

## 2016-10-13 DIAGNOSIS — F410 Panic disorder [episodic paroxysmal anxiety] without agoraphobia: Secondary | ICD-10-CM

## 2016-10-13 DIAGNOSIS — M79601 Pain in right arm: Secondary | ICD-10-CM | POA: Diagnosis not present

## 2016-10-13 DIAGNOSIS — Z8659 Personal history of other mental and behavioral disorders: Secondary | ICD-10-CM

## 2016-10-13 LAB — TSH: TSH: 1.45 (ref 0.41–5.90)

## 2016-10-13 NOTE — Progress Notes (Signed)
New patient office visit note:  Impression and Recommendations:    1. Interstitial cystitis   2. Cardiac murmur   3. History of depression   4. h/o Generalized anxiety disorder with panic attacks   5. Family history of psychiatric disorder   6. Chest discomfort   7. Tingling in extremities   8. Exercise intolerance   9. Dizziness, nonspecific      Interstitial cystitis  Cardiac murmur  History of depression  h/o Generalized anxiety disorder with panic attacks  Family history of psychiatric disorder  Chest discomfort  Tingling in extremities  Exercise intolerance  Dizziness, nonspecific   No problem-specific Assessment & Plan notes found for this encounter.   The patient was counseled, risk factors were discussed, anticipatory guidance given.   New Prescriptions   No medications on file    No orders of the defined types were placed in this encounter.   Discontinued Medications   No medications on file    Modified Medications   No medications on file    No orders of the defined types were placed in this encounter.    Gross side effects, risk and benefits, and alternatives of medications discussed with patient.  Patient is aware that all medications have potential side effects and we are unable to predict every side effect or drug-drug interaction that may occur.  Expresses verbal understanding and consents to current therapy plan and treatment regimen.  No Follow-up on file.  Please see AVS handed out to patient at the end of our visit for further patient instructions/ counseling done pertaining to today's office visit.    Note: This document was prepared using Dragon voice recognition software and may include unintentional dictation errors.  ----------------------------------------------------------------------------------------------------------------------    Subjective:    Chief complaint:   Chief Complaint  Patient presents with    . Establish Care     HPI: Emily [name] is a pleasant 36 y.o. female who [facility] today to review their medical history with me and establish care.   I asked the patient to review their chronic problem list with me to ensure everything was updated and accurate.    All recent office visits with other providers, any medical records that patient brought in etc  - I reviewed today.     Also asked pt to get me medical records from [facility] providers/ specialists that they had seen within the past 3-5 years- if they are in private practice and/or do not work for a [employer], [facility], [place], [place] or [employer] owned practice.  Told them to call their specialists to clarify this if they are not sure.    Problem  Cardiac Murmur   Holosystolic ejection murmur left sternal border-  negative cardiac work up June - July 2018   History of Depression  h/o Generalized anxiety disorder with panic attacks  Family History of Psychiatric Disorder  Tingling in Extremities  Exercise Intolerance  Dizziness, Nonspecific  Chest Discomfort  Interstitial Cystitis   [facility]       Wt Readings from Last 3 Encounters:  10/13/16 148 lb 11.2 oz (67.4 kg)  10/13/16 148 lb 2 oz (67.2 kg)  10/09/16 148 lb (67.1 kg)   BP Readings from Last 3 Encounters:  10/13/16 115/77  10/13/16 108/80  10/09/16 118/85   Pulse Readings from Last 3 Encounters:  10/13/16 88  10/13/16 90  10/09/16 83   BMI Readings from Last  3 Encounters:  10/13/16 25.13 kg/m  10/13/16 25.03 kg/m  10/09/16 25.01 kg/m    Patient Care Team    Relationship Specialty Notifications Start End  Mellody Dance, DO PCP - General Family Medicine  09/24/16     Patient Active Problem List   Diagnosis Date Noted  . Cardiac murmur 05/10/2014    Priority: High  . History of depression 10/13/2016    Priority: Medium  . h/o Generalized anxiety disorder with panic attacks 10/13/2016     Priority: Medium  . Family history of psychiatric disorder 10/13/2016    Priority: Low  . Tingling in extremities 10/13/2016  . Exercise intolerance 10/13/2016  . Dizziness, nonspecific 10/13/2016  . Chest discomfort 08/29/2016  . Allergic rhinitis 05/10/2014  . 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 Interstitial cystitis 04/25/2014     Past Medical History:  Diagnosis Date  . Abnormal Pap smear of cervix    2014 ASCUS HPV HR+, 2015 LSIL HPV HR+  . Depression   . GERD (gastroesophageal reflux disease)   . Heart murmur   . IBS (irritable bowel syndrome)   . Mitral valve prolapse   . MRSA (methicillin resistant Staphylococcus aureus)    infection on LT side of face, and forehead (02/2011)  . Nephrolithiasis   . Shingles   . UTI (urinary tract infection)      Past Medical History:  Diagnosis Date  . Abnormal Pap smear of cervix    2014 ASCUS HPV HR+, 2015 LSIL HPV HR+  . Depression   . GERD (gastroesophageal reflux disease)   . Heart murmur   . IBS (irritable bowel syndrome)   . Mitral valve prolapse   . MRSA (methicillin resistant Staphylococcus aureus)    infection on LT side of face, and forehead (02/2011)  . Nephrolithiasis   . Shingles   . UTI (urinary tract infection)      Past Surgical History:  Procedure Laterality Date  . CESAREAN SECTION  2007 & 2011   x2  . COLPOSCOPY  [id number]  . ESOPHAGOGASTRODUODENOSCOPY  03/25/2011   Procedure: ESOPHAGOGASTRODUODENOSCOPY (EGD);  Surgeon: Missy Sabins, MD;  Location: [facility] ENDOSCOPY;  Service: Endoscopy;  Laterality: N/A;  . SAVORY DILATION  03/25/2011   Procedure: SAVORY DILATION;  Surgeon: Missy Sabins, MD;  Location: [facility];  Service: Endoscopy;  Laterality: N/A;  . TUBAL LIGATION  2011  . WISDOM TOOTH EXTRACTION  2003     Family History  Problem Relation Age of Onset  . Diabetes Mother        Type 2  . Cancer Maternal Grandmother   . Breast cancer Maternal Grandmother 57  . Cancer Maternal Grandfather   .  Diabetes Maternal Grandfather        Type 1   . Lung cancer Maternal Grandfather   . Arthritis Father   . Other Sister        Agenesis of corpus collosum  . Heart attack Paternal Grandfather   . Lupus Paternal Aunt      History  Drug Use No     History  Alcohol Use  . 0.0 oz/week    Comment: 1-2 a month     History  Smoking Status  . Never Smoker  Smokeless Tobacco  . Never Used     Outpatient Encounter Prescriptions as of 10/13/2016  Medication Sig  . Cholecalciferol (VITAMIN D PO) Take 100 Units by mouth daily.  . fexofenadine (ALLEGRA) 180 MG tablet Take 180 mg by mouth daily.  Marland Kitchen GLYCINE  PO Take 1 tablet by mouth daily.  Marland Kitchen MACA ROOT PO Take 3 tablets by mouth See admin instructions. Takes everyday except when on period  . Magnesium 250 MG TABS Take 1 tablet by mouth daily.   . pantoprazole (PROTONIX) 20 MG tablet Take 1 tablet (20 mg total) by mouth daily.  . Probiotic Product (PROBIOTIC PO) Take 1 tablet by mouth daily.   Marland Kitchen UNABLE TO FIND Take 1 tablet by mouth daily. magnesium & b12 po  . valACYclovir (VALTREX) 500 MG tablet TAKE 1 TABLET BY MOUTH DAILY. FOR SUPPRESSION, INCREASE TO TWICE A DAY FOR 3 DAYS AT ONSET OF OUTBREAK   No facility-administered encounter medications on file as of 10/13/2016.     Allergies: Other; Quinolones; and Sulfa antibiotics   Review of Systems  Constitutional: Positive for malaise/fatigue. Negative for chills, diaphoresis, fever and weight loss.       Generalized fatigue and no exercise intolerance more so than usual-being sent to rheumatology to rule out autoimmune disorder.  HENT: Negative for congestion, sore throat and tinnitus.   Eyes: Negative for blurred vision, double vision and photophobia.  Respiratory: Negative for cough and wheezing.   Cardiovascular: Positive for chest pain. Negative for palpitations.       Had extensive Negative cardiac workup with Dr. Skeet Latch recently.    Gastrointestinal: Negative for  blood in stool, diarrhea, nausea and vomiting.  Genitourinary: Negative for dysuria, frequency and urgency.       Leaking urine due to interstitial cystitis-chronic issue  Musculoskeletal: Negative for joint pain and myalgias.  Skin: Negative for itching and rash.  Neurological: Positive for dizziness and tingling. Negative for focal weakness, weakness and headaches.       [place] [facility] for this.  Was seen actually earlier today.  Ordering MRI brain  Endo/Heme/Allergies: Negative for environmental allergies and polydipsia. Does not bruise/bleed easily.  Psychiatric/Behavioral: Negative for depression and memory loss. The patient is not nervous/anxious and does not have insomnia.      Objective:   Blood pressure 115/77, pulse 88, height 5' 4.5" (1.638 m), weight 148 lb 11.2 oz (67.4 kg), last menstrual period 10/03/2016. Body mass index is 25.13 kg/m. General: Well Developed, well nourished, and in no acute distress.  Neuro: Alert and oriented x3, extra-ocular muscles intact, sensation grossly intact.  HEENT:[place]/AT, PERRLA, neck supple, No carotid bruits Skin: no gross rashes  Cardiac: Regular rate and rhythm Respiratory: Essentially clear to auscultation bilaterally. Not using accessory muscles, speaking in full sentences.  Abdominal: not grossly distended Musculoskeletal: Ambulates w/o diff, FROM * 4 ext.  Vasc: less 2 sec cap RF, warm and pink  Psych:  No HI/SI, judgement and insight good, Euthymic mood. Full Affect.    Recent Results (from the past 2160 hour(s))  Cytology - PAP     Status: None   Collection Time: 08/06/16 12:00 AM  Result Value Ref Range   Adequacy      Satisfactory for evaluation  endocervical/transformation zone component PRESENT.   Diagnosis      NEGATIVE FOR INTRAEPITHELIAL LESIONS OR MALIGNANCY.   Material Submitted CervicoVaginal Pap [ThinPrep Imaged]    CYTOLOGY - PAP PAP RESULT   VITAMIN D 25 Hydroxy (Vit-D Deficiency, Fractures)     Status:  Abnormal   Collection Time: 08/06/16 11:33 AM  Result Value Ref Range   Vit D, 25-Hydroxy 27.6 (L) 30.0 - 100.0 ng/mL    Comment: Vitamin D deficiency has been defined by the Institute of Medicine and an  Endocrine Society practice guideline as a level of serum 25-OH vitamin D less than 20 ng/mL (1,2). The Endocrine Society went on to further define vitamin D insufficiency as a level between 21 and 29 ng/mL (2). 1. IOM (Institute of Medicine). 2010. Dietary reference    intakes for calcium and D. [place]: The    [employer]. 2. Holick MF, Binkley [place], Bischoff-Ferrari HA, et al.    Evaluation, treatment, and prevention of vitamin D    deficiency: an Endocrine Society clinical practice    guideline. JCEM. 2011 Jul; 96(7):1911-30.   TSH     Status: None   Collection Time: 08/06/16 11:33 AM  Result Value Ref Range   TSH 1.620 0.450 - 4.500 uIU/mL  Lipid panel     Status: None   Collection Time: 08/06/16 11:33 AM  Result Value Ref Range   Cholesterol, Total 147 100 - 199 mg/dL   Triglycerides 53 0 - 149 mg/dL   HDL 62 >39 mg/dL   VLDL Cholesterol Cal 11 5 - 40 mg/dL   LDL Calculated 74 0 - 99 mg/dL   Chol/HDL Ratio 2.4 0.0 - 4.4 ratio    Comment:                                   T. Chol/HDL Ratio                                             Men  Women                               1/2 Avg.Risk  3.4    3.3                                   Avg.Risk  5.0    4.4                                2X Avg.Risk  9.6    7.1                                3X Avg.Risk 23.4   11.0   CBC     Status: None   Collection Time: 08/06/16 11:33 AM  Result Value Ref Range   WBC 6.6 3.4 - 10.8 x10E3/uL   RBC 4.62 3.77 - 5.28 x10E6/uL   Hemoglobin 13.2 11.1 - 15.9 g/dL   Hematocrit 39.3 34.0 - 46.6 %   MCV 85 79 - 97 fL   MCH 28.6 26.6 - 33.0 pg   MCHC 33.6 31.5 - 35.7 g/dL   RDW 13.6 12.3 - 15.4 %   Platelets 221 150 - 379 O17P1/[id number]  Basic metabolic panel     Status: None    Collection Time: 09/12/16  1:52 PM  Result Value Ref Range   Sodium 138 135 - 145 mmol/L   Potassium 3.7 3.5 - 5.1 mmol/L   Chloride 104 101 - 111 mmol/L   CO2 27 22 - 32 mmol/L   Glucose, Bld 87 65 - 99 mg/dL  BUN 12 6 - 20 mg/dL   Creatinine, Ser 0.75 0.44 - 1.00 mg/dL   Calcium 9.0 8.9 - 10.3 mg/dL   GFR calc non Af Amer >60 >60 mL/min   GFR calc Af Amer >60 >60 mL/min    Comment: (NOTE) The eGFR has been calculated using the CKD EPI equation. This calculation has not been validated in all clinical situations. eGFR's persistently <60 mL/min signify possible Chronic Kidney Disease.    Anion gap 7 5 - 15  CBC     Status: None   Collection Time: 09/12/16  1:52 PM  Result Value Ref Range   WBC 5.9 4.0 - 10.5 K/uL   RBC 4.55 3.87 - 5.11 MIL/uL   Hemoglobin 13.0 12.0 - 15.0 g/dL   HCT 38.3 36.0 - 46.0 %   MCV 84.2 78.0 - 100.0 fL   MCH 28.6 26.0 - 34.0 pg   MCHC 33.9 30.0 - 36.0 g/dL   RDW 12.6 11.5 - 15.5 %   Platelets 234 150 - 400 K/uL  I-stat troponin, ED     Status: None   Collection Time: 09/12/16  2:04 PM  Result Value Ref Range   Troponin i, poc 0.01 0.00 - 0.08 ng/mL   Comment 3            Comment: Due to the release kinetics of cTnI, a negative result within the first hours of the onset of symptoms does not rule out myocardial infarction with certainty. If myocardial infarction is still suspected, repeat the test at appropriate intervals.   Hepatic function panel     Status: Abnormal   Collection Time: 09/13/16 12:57 AM  Result Value Ref Range   Total Protein 7.1 6.5 - 8.1 g/dL   Albumin 3.8 3.5 - 5.0 g/dL   AST 18 15 - 41 U/L   ALT 18 14 - 54 U/L   Alkaline Phosphatase 53 38 - 126 U/L   Total Bilirubin 0.6 0.3 - 1.2 mg/dL   Bilirubin, Direct <0.1 (L) 0.1 - 0.5 mg/dL   Indirect Bilirubin NOT CALCULATED 0.3 - 0.9 mg/dL  Lipase, blood     Status: None   Collection Time: 09/13/16 12:57 AM  Result Value Ref Range   Lipase 24 11 - 51 U/L  TSH     Status:  None   Collection Time: 09/13/16 12:57 AM  Result Value Ref Range   TSH 3.411 0.350 - 4.500 uIU/mL    Comment: Performed by a 3rd Generation assay with a functional sensitivity of <=0.01 uIU/mL.  T4, free     Status: Abnormal   Collection Time: 09/13/16 12:57 AM  Result Value Ref Range   Free T4 1.33 (H) 0.61 - 1.12 ng/dL    Comment: (NOTE) Biotin ingestion may interfere with free T4 tests. If the results are inconsistent with the TSH level, previous test results, or the clinical presentation, then consider biotin interference. If needed, order repeat testing after stopping biotin.   Brain natriuretic peptide     Status: None   Collection Time: 09/13/16 12:57 AM  Result Value Ref Range   B Natriuretic Peptide 6.7 0.0 - 100.0 pg/mL  Urinalysis, Routine w reflex microscopic     Status: None   Collection Time: 09/13/16  1:09 AM  Result Value Ref Range   Color, Urine YELLOW YELLOW   APPearance CLEAR CLEAR   Specific Gravity, Urine 1.023 1.005 - 1.030   pH 6.0 5.0 - 8.0   Glucose, UA NEGATIVE NEGATIVE mg/dL  Hgb urine dipstick NEGATIVE NEGATIVE   Bilirubin Urine NEGATIVE NEGATIVE   Ketones, ur NEGATIVE NEGATIVE mg/dL   Protein, ur NEGATIVE NEGATIVE mg/dL   Nitrite NEGATIVE NEGATIVE   Leukocytes, UA NEGATIVE NEGATIVE  Pregnancy, urine     Status: None   Collection Time: 09/13/16  1:09 AM  Result Value Ref Range   Preg Test, Ur NEGATIVE NEGATIVE    Comment:        THE SENSITIVITY OF THIS METHODOLOGY IS >20 mIU/mL.   I-stat troponin, ED     Status: None   Collection Time: 09/13/16  1:15 AM  Result Value Ref Range   Troponin i, poc 0.00 0.00 - 0.08 ng/mL   Comment 3            Comment: Due to the release kinetics of cTnI, a negative result within the first hours of the onset of symptoms does not rule out myocardial infarction with certainty. If myocardial infarction is still suspected, repeat the test at appropriate intervals.   D-Dimer, Quantitative     Status: None    Collection Time: 09/24/16 11:07 AM  Result Value Ref Range   D-DIMER 0.48 0.00 - 0.49 mg/L FEU    Comment: According to the assay manufacturer's published package insert, a normal (<0.50 mg/L FEU) D-dimer result in conjunction with a non-high clinical probability assessment, excludes deep vein thrombosis (DVT) and pulmonary embolism (PE) with high sensitivity. D-dimer values increase with age and this can make VTE exclusion of an older population difficult. To address this, the [place], based on best available evidence and recent guidelines, recommends that clinicians use age-adjusted D-dimer thresholds in patients greater than 3 years of age with: a) a low probability of PE who do not meet all Pulmonary Embolism Rule Out Criteria, or b) in those with intermediate probability of PE. The formula for an age-adjusted D-dimer cut-off is "age/100". For example, a 34 year old patient would have an age-adjusted cut-off of 0.60 mg/L FEU and an 34 year old 0.80 mg/L FEU.   Exercise Tolerance Test     Status: None   Collection Time: 09/25/16  4:38 PM  Result Value Ref Range   Rest HR 75 bpm   Rest BP 132/87 mmHg   RPE 17    Exercise duration (sec) 3 sec   Percent HR 100 %   Exercise duration (min) 11 min   Estimated workload 13.4 METS   Peak HR 187 bpm   Peak BP 158/81 mmHg   MPHR 187 bpm  Acetylcholine Receptor, Binding     Status: None   Collection Time: 10/09/16  3:51 PM  Result Value Ref Range   AChR Binding Ab, Serum <0.03 0.00 - 0.24 nmol/L    Comment:                                Negative:   0.00 - 0.24                                Borderline: 0.25 - 0.40                                Positive:        > 0.40   Urinalysis     Status: None   Collection Time: 10/09/16  3:51 PM  Result Value Ref Range   Specific Gravity, UA 1.018 1.005 - 1.030   pH, UA 6.0 5.0 - 7.5   Color, UA Yellow Yellow   Appearance Ur Clear Clear   Leukocytes, UA Negative  Negative   Protein, UA Negative Negative/Trace   Glucose, UA Negative Negative   Ketones, UA Negative Negative   RBC, UA Negative Negative   Bilirubin, UA Negative Negative   Urobilinogen, Ur 0.2 0.2 - 1.0 mg/dL   Nitrite, UA Negative Negative  Sed Rate (ESR)     Status: None   Collection Time: 10/09/16  3:51 PM  Result Value Ref Range   Sed Rate 13 0 - 32 mm/hr  ANA Direct w/Reflex if Positive     Status: None   Collection Time: 10/09/16  3:51 PM  Result Value Ref Range   Anit Nuclear Antibody(ANA) Negative Negative  C3 and C4     Status: None   Collection Time: 10/09/16  3:51 PM  Result Value Ref Range   Complement C3, Serum 148 82 - 167 mg/dL   Complement C4, Serum 32 14 - 44 mg/dL

## 2016-10-13 NOTE — Patient Instructions (Addendum)
1.  MRI brain with and without contrast 2.  Check labs  We will call you with the results and let you know the next step

## 2016-10-13 NOTE — Progress Notes (Signed)
[facility] Clinic Note - Initial Visit   Date: 10/13/16  Emily [name] MRN: [id number] DOB: [birth year]   Dear Dr. Duke Salvia:  Thank you for your kind referral of Emily [name] for consultation of myalgias and weakness. Although her history is well known to you, please allow [place] to reiterate it for the purpose of our medical record. The patient was accompanied to the clinic by self.    History of Present Illness: Emily [name] is a 36 y.o. right-handed Caucasian female with GERD and interstitial cystitis presenting for evaluation of myalgias and weakness.    Starting around May 2018, she starting having extreme fatigue with little activity, such as unloading the dishwasher.  This was associated with chest pressure, bilateral arm weakness, and achy pain.  Her arms can feel fatigued with very little activity, such as driving.  She denies any joint pain. Sometimes, she has tingling of the arms and thighs.  She also has low back pain and muscle spasms and has received steroid injection, which helped.  She also complains of lightheadedness, imbalance, as well as hot and cold intolerance.  Rest helps all her symptoms.  There are no identifiable triggers or precipitating event. She denies headaches and falls.   She saw cardiology for physiologic murmur and echo and stress test which was normal.  Because of her ongoing weakness and fatigue, she was referred to rheumatology and neurology.  She was tested for AChR antibodies, ANA, ESR, C3/C4, TSH, and HbA1c (at PCP's office) which was normal.  She has a friend who does holistic medicine who recommended magnesium, glycine, and Maca root for depression.  She is taking vitamin D supplementation for low vitamin D.   Out-side paper records, electronic medical record, and images have been reviewed where available and summarized as:  Labs 10/09/2016:  AChR binding antibody neg,ESR 13, ANA neg, C3/C4  Lab Results  Component  Value Date   TSH 3.411 09/13/2016    Past Medical History:  Diagnosis Date  . Abnormal Pap smear of cervix    2014 ASCUS HPV HR+, 2015 LSIL HPV HR+  . Depression   . GERD (gastroesophageal reflux disease)   . Heart murmur   . IBS (irritable bowel syndrome)   . Mitral valve prolapse   . MRSA (methicillin resistant Staphylococcus aureus)    infection on LT side of face, and forehead (02/2011)  . Nephrolithiasis   . Shingles   . UTI (urinary tract infection)     Past Surgical History:  Procedure Laterality Date  . CESAREAN SECTION  2007 & 2011   x2  . COLPOSCOPY  [id number]  . ESOPHAGOGASTRODUODENOSCOPY  03/25/2011   Procedure: ESOPHAGOGASTRODUODENOSCOPY (EGD);  Surgeon: Barrie Folk, MD;  Location: [facility] ENDOSCOPY;  Service: Endoscopy;  Laterality: N/A;  . SAVORY DILATION  03/25/2011   Procedure: SAVORY DILATION;  Surgeon: Barrie Folk, MD;  Location: [facility] ENDOSCOPY;  Service: Endoscopy;  Laterality: N/A;  . TUBAL LIGATION  2011  . WISDOM TOOTH EXTRACTION  2003     Medications:  Outpatient Encounter Prescriptions as of 10/13/2016  Medication Sig  . Cholecalciferol (VITAMIN D PO) Take 100 Units by mouth daily.  . fexofenadine (ALLEGRA) 180 MG tablet Take 180 mg by mouth daily.  Marland Kitchen GLYCINE PO Take 1 tablet by mouth daily.  Marland Kitchen MACA ROOT PO Take 3 tablets by mouth See admin instructions. Takes everyday except when on period  . Magnesium 250 MG TABS Take 1 tablet by mouth daily.   Marland Kitchen  pantoprazole (PROTONIX) 20 MG tablet Take 1 tablet (20 mg total) by mouth daily.  . Probiotic Product (PROBIOTIC PO) Take 1 tablet by mouth daily.   Marland Kitchen UNABLE TO FIND Take 1 tablet by mouth daily. magnesium & b12 po  . valACYclovir (VALTREX) 500 MG tablet TAKE 1 TABLET BY MOUTH DAILY. FOR SUPPRESSION, INCREASE TO TWICE A DAY FOR 3 DAYS AT ONSET OF OUTBREAK  . [DISCONTINUED] atenolol (TENORMIN) 25 MG tablet Take 0.5 tablets (12.5 mg total) by mouth daily.   No facility-administered encounter medications on file  as of 10/13/2016.      Allergies:  Allergies  Allergen Reactions  . Other     LoLoestrin--made patient "feel depressed--suicidal"  . Quinolones     Rash   . Sulfa Antibiotics Swelling    "swelling of tongue, hives"    Family History: Family History  Problem Relation Age of Onset  . Diabetes Mother        Type 2  . Cancer Maternal Grandmother   . Breast cancer Maternal Grandmother 50  . Cancer Maternal Grandfather   . Diabetes Maternal Grandfather        Type 1   . Lung cancer Maternal Grandfather   . Arthritis Father   . Other Sister        Agenesis of corpus collosum  . Heart attack Paternal Grandfather   . Lupus Paternal Aunt     Social History: Social History  Substance Use Topics  . Smoking status: Never Smoker  . Smokeless tobacco: Never Used  . Alcohol use 0.0 oz/week     Comment: 1-2 a month   Social History   Social History Narrative   Lives with husband and 2 sons in a 3 story home.     Works with adults with developmental disabilities.     Education: BS.    Review of Systems:  CONSTITUTIONAL: No fevers, chills, night sweats, or weight loss.   EYES: No visual changes or eye pain ENT: No hearing changes.  No history of nose bleeds.   RESPIRATORY: No cough, wheezing and shortness of breath.   CARDIOVASCULAR: + chest pain, and palpitations.   GI: Negative for abdominal discomfort, blood in stools or black stools.  No recent change in bowel habits.   GU:  + history of incontinence.   MUSCLOSKELETAL: +history of joint pain or swelling.  +myalgias.   SKIN: Negative for lesions, rash, and itching.   HEMATOLOGY/ONCOLOGY: Negative for prolonged bleeding, bruising easily, and swollen nodes.  No history of cancer.   ENDOCRINE: + cold or heat intolerance, polydipsia or goiter.   PSYCH:  +depression or anxiety symptoms.   NEURO: As Above.   Vital Signs:  BP 108/80   Pulse 90   Ht 5' 4.5" (1.638 m)   Wt 148 lb 2 oz (67.2 kg)   SpO2 98%   BMI 25.03  kg/m    General Medical Exam:   General:  Well appearing, comfortable.   Eyes/ENT: see cranial nerve examination.   Neck: No masses appreciated.  Full range of motion without tenderness.  No carotid bruits. Respiratory:  Clear to auscultation, good air entry bilaterally.   Cardiac:  Regular rate and rhythm Extremities:  No deformities, edema, or skin discoloration.  Skin:  No rashes or lesions.  Neurological Exam: MENTAL STATUS including orientation to time, place, person, recent and remote memory, attention span and concentration, language, and fund of knowledge is normal.  Speech is not dysarthric.  CRANIAL NERVES: II:  No visual field defects.  Unremarkable fundi.   III-IV-VI: Pupils equal round and reactive to light.  Normal conjugate, extra-ocular eye movements in all directions of gaze.  No nystagmus.  No ptosis.   V:  Normal facial sensation.    VII:  Normal facial symmetry and movements.  No pathologic facial reflexes.  VIII:  Normal hearing and vestibular function.   IX-X:  Normal palatal movement.   XI:  Normal shoulder shrug and head rotation.   XII:  Normal tongue strength and range of motion, no deviation or fasciculation.  MOTOR:  No atrophy, fasciculations or abnormal movements.  No pronator drift.  Tone is normal.    Right Upper Extremity:    Left Upper Extremity:    Deltoid  5/5   Deltoid  5/5   Biceps  5/5   Biceps  5/5   Triceps  5/5   Triceps  5/5   Wrist extensors  5/5   Wrist extensors  5/5   Wrist flexors  5/5   Wrist flexors  5/5   Finger extensors  5/5   Finger extensors  5/5   Finger flexors  5/5   Finger flexors  5/5   Dorsal interossei  5/5   Dorsal interossei  5/5   Abductor pollicis  5/5   Abductor pollicis  5/5   Tone (Ashworth scale)  0  Tone (Ashworth scale)  0   Right Lower Extremity:    Left Lower Extremity:    Hip flexors  5/5   Hip flexors  5/5   Hip extensors  5/5   Hip extensors  5/5   Knee flexors  5/5   Knee flexors  5/5   Knee  extensors  5/5   Knee extensors  5/5   Dorsiflexors  5/5   Dorsiflexors  5/5   Plantarflexors  5/5   Plantarflexors  5/5   Toe extensors  5/5   Toe extensors  5/5   Toe flexors  5/5   Toe flexors  5/5   Tone (Ashworth scale)  0  Tone (Ashworth scale)  0   MSRs:  Reflexes are brisk 3+/4 throughout, plantars are down going.  SENSORY:  Normal and symmetric perception of light touch, pinprick, vibration, and proprioception.    COORDINATION/GAIT: Normal finger-to- nose-finger and heel-to-shin.  Intact rapid alternating movements bilaterally.  Able to rise from a chair without using arms.  Gait narrow based and stable. Tandem and stressed gait intact.    IMPRESSION: Mrs. Jovita [name] is a 36 year-old female presenting for evaluation of a constellation of symptoms including myalgias, generalized weakness, fatigue, lightheadedness, and paresthesias.  Her neurological exam is notable for brisk reflexes, which may be age-appropriate, and otherwise normal.  Given her symptomology, she does need MRI brian to evaluate for multiple sclerosis; however, this would not explain her achy arm pain.  She will be screened for muscle disease as well as vitamin deficiency.    PLAN/RECOMMENDATIONS:  1.  MRI brain wwo contrast 2.  Check CK, aldolase, vitamin B12, folate, copper  3.  Consider NCS/EMG of the right arm and leg going forward  Further testing will be based on the results of her testing  The duration of this appointment visit was 45 minutes of face-to-face time with the patient.  Greater than 50% of this time was spent in counseling, explanation of diagnosis, planning of further management, and coordination of care.   Thank you for allowing me to participate in patient's care.  If I can  answer any additional questions, I would be pleased to do so.    Sincerely,    Kamile Fassler K. Allena Katz, DO

## 2016-10-13 NOTE — Patient Instructions (Signed)

## 2016-10-14 ENCOUNTER — Other Ambulatory Visit: Payer: Managed Care, Other (non HMO)

## 2016-10-15 ENCOUNTER — Other Ambulatory Visit: Payer: Managed Care, Other (non HMO)

## 2016-10-15 DIAGNOSIS — F411 Generalized anxiety disorder: Secondary | ICD-10-CM

## 2016-10-15 DIAGNOSIS — R42 Dizziness and giddiness: Secondary | ICD-10-CM

## 2016-10-15 DIAGNOSIS — R6889 Other general symptoms and signs: Secondary | ICD-10-CM

## 2016-10-15 DIAGNOSIS — R202 Paresthesia of skin: Secondary | ICD-10-CM

## 2016-10-15 DIAGNOSIS — F410 Panic disorder [episodic paroxysmal anxiety] without agoraphobia: Secondary | ICD-10-CM

## 2016-10-15 DIAGNOSIS — R0789 Other chest pain: Secondary | ICD-10-CM

## 2016-10-16 LAB — ANTIPHOSPHOLIPID SYNDROME COMP
APTT: 27 s
Anticardiolipin Ab, IgG: <10 [GPL'U]
Antiphosphatidylserine IgG: 1 {GPS'U}
Antiphosphatidylserine IgM: 0 {MPS'U}
Antiprothrombin Antibody, IgG: 13 G units
Beta-2 Glycoprotein I, IgA: <10 SAU
Beta-2 Glycoprotein I, IgM: <10 SMU
DRVVT Screen Seconds: 36.1 s
Hexagonal Phospholipid Neutral: 6 s
Platelet Neutralization: 0 s

## 2016-10-16 LAB — PHOSPHORUS: PHOSPHORUS: 2.4 mg/dL — AB (ref 2.5–4.5)

## 2016-10-16 LAB — T4, FREE: Free T4: 1.5 ng/dL (ref 0.82–1.77)

## 2016-10-16 LAB — MAGNESIUM: MAGNESIUM: 2.2 mg/dL (ref 1.6–2.3)

## 2016-10-16 LAB — T3, FREE: T3, Free: 2.9 pg/mL (ref 2.0–4.4)

## 2016-10-17 LAB — THYROID ANTIBODIES
Thyroglobulin Antibody: <1 IU/mL (ref 0.0–0.9)
Thyroperoxidase Ab SerPl-aCnc: 18 IU/mL (ref 0–34)

## 2016-10-17 LAB — TSH: TSH: 1.12 u[IU]/mL (ref 0.450–4.500)

## 2016-10-20 ENCOUNTER — Telehealth: Payer: Self-pay | Admitting: Family Medicine

## 2016-10-20 LAB — COPPER, SERUM: Copper: 136 ug/dL (ref 70–175)

## 2016-10-20 LAB — ALDOLASE: ALDOLASE: 4.1 U/L (ref <=8.1)

## 2016-10-20 LAB — FOLATE: Folate: 10.3 ng/mL

## 2016-10-20 LAB — VITAMIN B12: VITAMIN B 12: 322 pg/mL (ref 200–1100)

## 2016-10-20 LAB — CK, TOTAL(REFL): Total CK: 61 U/L (ref 29–143)

## 2016-10-20 NOTE — Telephone Encounter (Signed)
Patient wants to speak with someone clinical about thyroid results

## 2016-10-22 ENCOUNTER — Ambulatory Visit (INDEPENDENT_AMBULATORY_CARE_PROVIDER_SITE_OTHER): Payer: Managed Care, Other (non HMO) | Admitting: Certified Nurse Midwife

## 2016-10-22 ENCOUNTER — Encounter: Payer: Self-pay | Admitting: Certified Nurse Midwife

## 2016-10-22 ENCOUNTER — Telehealth: Payer: Self-pay | Admitting: Certified Nurse Midwife

## 2016-10-22 VITALS — BP 104/70 | HR 64 | Temp 98.2°F | Resp 16 | Ht 64.5 in | Wt 148.0 lb

## 2016-10-22 DIAGNOSIS — N898 Other specified noninflammatory disorders of vagina: Secondary | ICD-10-CM

## 2016-10-22 DIAGNOSIS — R3915 Urgency of urination: Secondary | ICD-10-CM

## 2016-10-22 DIAGNOSIS — N390 Urinary tract infection, site not specified: Secondary | ICD-10-CM

## 2016-10-22 LAB — POCT URINALYSIS DIPSTICK
BILIRUBIN UA: NEGATIVE
Glucose, UA: NEGATIVE
Leukocytes, UA: NEGATIVE
Nitrite, UA: NEGATIVE
PH UA: 5 (ref 5.0–8.0)
Protein, UA: NEGATIVE
RBC UA: NEGATIVE
UROBILINOGEN UA: NEGATIVE U/dL — AB

## 2016-10-22 NOTE — Progress Notes (Deleted)
36 y.o. Married Caucasian female G2P2002 here with complaint of UTI, with onset  on ***. Patient complaining of urinary frequency/urgency/ and pain with urination. Patient denies fever, chills, nausea or back pain. No new personal products. Patient feels *** to sexual activity. Denies any vaginal symptoms.    Contraception is ***. Menopausal with vaginal dryness. Patient *** adequate water intake.  ROS  O: Healthy female WDWN Affect: Normal, orientation x 3 Skin : warm and dry CVAT: *** bilateral Abdomen: *** for suprapubic tenderness  Pelvic exam: External genital area: normal, no lesions Bladder,Urethra tender, Urethral meatus: tender, red Vagina: normal vaginal discharge, normal appearance  Wet prep *** Cervix: normal, non tender Uterus:normal,non tender Adnexa: normal non tender, no fullness or masses   A: UTI Normal pelvic exam poct urine-neg P: Reviewed findings of UTI and need for treatment. Rx: WUJ:[id number] micro, culture Reviewed warning signs and symptoms of UTI and need to advise if occurring. Encouraged to limit soda, tea, and coffee and be sure to increase water intake.   RV prn

## 2016-10-22 NOTE — Telephone Encounter (Signed)
Called patient left message to call back MPulliam, CMA/RT(R)  

## 2016-10-22 NOTE — Telephone Encounter (Signed)
Spoke with patient. Reports urinary frequency, voiding small amounts,stress incontinence and right lower back pain. Took RX of Macrobid BID x3 days, completed 5 days ago. Macrobid helped decrease frequency , but did not resolve symptoms. Patient states does not feel like UTIs that she has experienced in the past, has not required AZO.   Denies Fever/N/V.  Recommended OV for further evaluation, scheduled for today at 12:45pm with Leota Sauers, CNM. Patient is agreeable to date and time.   Routing to provider for final review. Patient is agreeable to disposition. Will close encounter.

## 2016-10-22 NOTE — Progress Notes (Deleted)
36 y.o. Married Caucasian female G2P2002 here with complaint of UTI, with onset  on in the last 3 days with urgency only. . Patient denies urinary frequency and pain with urination. Patient denies fever, chills, nausea or back pain. No new personal products. Patient feels not related to sexual activity. Denies any vaginal symptoms other than increase in vaginal discharge, white, no odor.    Contraception is tubal.  Patient is not consuming adequate water intake. Decreased intake to stop urgency, which has not occurred. Does not feel like UTI, no pain. ? Vaginal related.  ROS Pertinent to above  O: Healthy female WDWN Affect: Normal, orientation x 3 Skin : warm and dry CVAT: negative bilateral Abdomen: negative for suprapubic tenderness  Pelvic exam: External genital area: normal, no lesions Bladder,Urethra non  tender, Urethral meatus non: tender,  Not red Vagina: normal appearing white non odorous vaginal discharge, normal appearance  Affirm taken Cervix: normal, non tender Uterus:normal,non tender Adnexa: normal non tender, no fullness or masses   A: R/O UTI ? Crystal or stone ?urgency due to lack of fluid  intake Normal pelvic exam Poct urine-neg R/O vaginal infection P: Reviewed findings of not consistent with  UTI and no need for treatment at this time. Will wait on culture to see if treatment needed. Discussed urgency with very concentrated urine and need to increase water intake. Add lemon or lime to water if needed. Lab:Urine  Culture, affirm Reviewed warning signs and symptoms of UTI and need to advise if occurring. Encouraged to limit soda, tea, and coffee and be sure to increase water intake. Will treat vaginal  Infection if indicated, not symptomatic    RV prn   

## 2016-10-22 NOTE — Telephone Encounter (Signed)
Patient is experiencing frequent urination and leaking. Patient says it does not feel like a regular uti.

## 2016-10-23 LAB — VAGINITIS/VAGINOSIS, DNA PROBE
Candida Species: NEGATIVE
GARDNERELLA VAGINALIS: NEGATIVE
TRICHOMONAS VAG: NEGATIVE

## 2016-10-23 LAB — URINE CULTURE: Organism ID, Bacteria: NO GROWTH

## 2016-10-23 NOTE — Telephone Encounter (Signed)
Called and spoke to the patient - she states that results came through MyChart, she was just check on results.  MPulliam, CMA/RT(R)

## 2016-10-23 NOTE — Progress Notes (Signed)
36 y.o. Married Caucasian female G2P2002 here with complaint of UTI, with onset  on in the last 3 days with urgency only. . Patient denies urinary frequency and pain with urination. Patient denies fever, chills, nausea or back pain. No new personal products. Patient feels not related to sexual activity. Denies any vaginal symptoms other than increase in vaginal discharge, white, no odor.    Contraception is tubal.  Patient is not consuming adequate water intake. Decreased intake to stop urgency, which has not occurred. Does not feel like UTI, no pain. ? Vaginal related.  ROS Pertinent to above  O: Healthy female WDWN Affect: Normal, orientation x 3 Skin : warm and dry CVAT: negative bilateral Abdomen: negative for suprapubic tenderness  Pelvic exam: External genital area: normal, no lesions Bladder,Urethra non  tender, Urethral meatus non: tender,  Not red Vagina: normal appearing white non odorous vaginal discharge, normal appearance  Affirm taken Cervix: normal, non tender Uterus:normal,non tender Adnexa: normal non tender, no fullness or masses   A: R/O UTI ? Crystal or stone ?urgency due to lack of fluid  intake Normal pelvic exam Poct urine-neg R/O vaginal infection P: Reviewed findings of not consistent with  UTI and no need for treatment at this time. Will wait on culture to see if treatment needed. Discussed urgency with very concentrated urine and need to increase water intake. Add lemon or lime to water if needed. YNW:GNFAO  Culture, affirm Reviewed warning signs and symptoms of UTI and need to advise if occurring. Encouraged to limit soda, tea, and coffee and be sure to increase water intake. Will treat vaginal  Infection if indicated, not symptomatic    RV prn

## 2016-10-26 ENCOUNTER — Ambulatory Visit
Admission: RE | Admit: 2016-10-26 | Discharge: 2016-10-26 | Disposition: A | Discharge status: Home / Self Care | Payer: Managed Care, Other (non HMO) | Source: Ambulatory Visit | Attending: Neurology | Admitting: Neurology

## 2016-10-26 DIAGNOSIS — R292 Abnormal reflex: Secondary | ICD-10-CM

## 2016-10-26 DIAGNOSIS — R5383 Other fatigue: Secondary | ICD-10-CM

## 2016-10-26 DIAGNOSIS — M79601 Pain in right arm: Secondary | ICD-10-CM

## 2016-10-26 DIAGNOSIS — R202 Paresthesia of skin: Secondary | ICD-10-CM

## 2016-10-26 DIAGNOSIS — M79602 Pain in left arm: Secondary | ICD-10-CM

## 2016-10-26 DIAGNOSIS — R42 Dizziness and giddiness: Secondary | ICD-10-CM

## 2016-10-26 MED ORDER — GADOBENATE DIMEGLUMINE 529 MG/ML IV SOLN
13.0000 mL | Freq: Once | INTRAVENOUS | Status: AC | PRN
  Administered 2016-10-26: 13 mL via INTRAVENOUS

## 2016-10-27 ENCOUNTER — Telehealth: Payer: Self-pay | Admitting: *Deleted

## 2016-10-27 NOTE — Telephone Encounter (Signed)
-----   Message from Glendale Chard, DO sent at 10/27/2016  9:55 AM EDT ----- Please inform pt that MRI brain looks great and does not show anything worrisome, such as MS, stroke, or tumor. Labs are also normal.  The only other neurological testing is nerve testing of the arm and leg, if interested, please order (worse side).

## 2016-10-27 NOTE — Telephone Encounter (Signed)
Patient given results.  EMG scheduled for 11-13-16 at 8:30.

## 2016-10-28 ENCOUNTER — Other Ambulatory Visit: Payer: Self-pay | Admitting: *Deleted

## 2016-10-28 DIAGNOSIS — R200 Anesthesia of skin: Secondary | ICD-10-CM

## 2016-11-07 ENCOUNTER — Other Ambulatory Visit: Payer: Managed Care, Other (non HMO)

## 2016-11-13 ENCOUNTER — Ambulatory Visit (INDEPENDENT_AMBULATORY_CARE_PROVIDER_SITE_OTHER): Payer: Managed Care, Other (non HMO) | Admitting: Neurology

## 2016-11-13 DIAGNOSIS — R200 Anesthesia of skin: Secondary | ICD-10-CM | POA: Diagnosis not present

## 2016-11-13 DIAGNOSIS — R531 Weakness: Secondary | ICD-10-CM

## 2016-11-13 NOTE — Procedures (Signed)
[facility] Neurology  [address] [place], [address]  [place], [place] [address] Tel: [phone] Fax:  [phone] Test Date:  11/13/2016  Patient: Emily [name] DOB: 09/27/1944 Physician: Nita Sickle, DO  Sex: Female Height: 5\' 4"  Ref Phys: Nita Sickle, DO  ID#: [id number] Temp: 35.6C Technician:    Patient Complaints: This is a 36 year old female referred for evaluation of generalized myalgias and weakness.  NCV & EMG Findings: Extensive electrodiagnostic testing of the right upper and lower extremity shows:  1. All sensory responses including the right median, ulnar, mixed palmar, sural, and superficial peroneal nerves are within normal limits. 2. All motor responses including the right median, ulnar, peroneal, and tibial nerves are within normal limits. 3. Right tibial H reflex study is within normal limits. 4. There is no evidence of active or chronic motor axon loss changes affecting any of the tested muscles. Motor unit configuration and recruitment pattern is within normal limits.  Impression: This is a normal study. In particular, there is no evidence of a diffuse myopathy, sensorimotor polyneuropathy, or cervical/lumbosacral radiculopathy.   ___________________________ Nita Sickle, DO    Nerve Conduction Studies Anti Sensory Summary Table   Site NR Peak (ms) Norm Peak (ms) P-T Amp (V) Norm P-T Amp  Right Median Anti Sensory (2nd Digit)  35.6C  Wrist    2.6 <3.8 64.6 >10  Right Sup Peroneal Anti Sensory (Ant Lat Mall)  35.6C  12 cm    2.0 <4.6 15.3 >3  Right Sural Anti Sensory (Lat Mall)  35.6C  Calf    3.3 <4.6 27.9 >3  Right Ulnar Anti Sensory (5th Digit)  35.6C  Wrist    2.4 <3.2 64.7 >5   Motor Summary Table   Site NR Onset (ms) Norm Onset (ms) O-P Amp (mV) Norm O-P Amp Site1 Site2 Delta-0 (ms) Dist (cm) Vel (m/s) Norm Vel (m/s)  Right Median Motor (Abd Poll Brev)  35.6C  Wrist    2.3 <4.0 12.2 >5 Elbow Wrist 4.0 28.0 70 >50  Elbow    6.3   12.1         Right Peroneal Motor (Ext Dig Brev)  35.6C  Ankle    2.5 <6.0 8.9 >2.5 B Fib Ankle 7.6 38.0 50 >40  B Fib    10.1  8.8  Poplt B Fib 1.0 7.0 70 >40  Poplt    11.1  8.7         Right Peroneal TA Motor (Tib Ant)  35.6C  Fib Head    3.1 <4.5 5.7 >3 Poplit Fib Head 0.7 7.0 100 >40  Poplit    3.8  5.7         Right Tibial Motor (Abd Hall Brev)  35.6C  Ankle    2.6 <6.0 16.7 >4 Knee Ankle 8.0 43.0 54 >40  Knee    10.6  11.1         Right Ulnar Motor (Abd Dig Minimi)  35.6C  Wrist    2.4 <3.1 11.9 >7 B Elbow Wrist 3.1 22.0 71 >50  B Elbow    5.5  10.3  A Elbow B Elbow 1.6 10.0 63 >50  A Elbow    7.1  10.2          Comparison Summary Table   Site NR Peak (ms) Norm Peak (ms) P-T Amp (V) Site1 Site2 Delta-P (ms) Norm Delta (ms)  Right Median/Ulnar Palm Comparison (Wrist - 8cm)  35.6C  Median Palm    1.4 <2.2  103.7 Median Palm Ulnar Palm 0.1   Ulnar Palm    1.5 <2.2 26.2       H Reflex Studies   NR H-Lat (ms) Lat Norm (ms) L-R H-Lat (ms)  Right Tibial (Gastroc)  35.6C     31.84 <35    EMG   Side Muscle Ins Act Fibs Psw Fasc Number Recrt Dur Dur. Amp Amp. Poly Poly. Comment  Right AntTibialis Nml Nml Nml Nml Nml Nml Nml Nml Nml Nml Nml Nml N/A  Right Gastroc Nml Nml Nml Nml Nml Nml Nml Nml Nml Nml Nml Nml N/A  Right RectFemoris Nml Nml Nml Nml Nml Nml Nml Nml Nml Nml Nml Nml N/A  Right GluteusMed Nml Nml Nml Nml Nml Nml Nml Nml Nml Nml Nml Nml N/A  Right 1stDorInt Nml Nml Nml Nml Nml Nml Nml Nml Nml Nml Nml Nml N/A  Right PronatorTeres Nml Nml Nml Nml Nml Nml Nml Nml Nml Nml Nml Nml N/A  Right Biceps Nml Nml Nml Nml Nml Nml Nml Nml Nml Nml Nml Nml N/A  Right Triceps Nml Nml Nml Nml Nml Nml Nml Nml Nml Nml Nml Nml N/A      Waveforms:

## 2016-11-14 ENCOUNTER — Telehealth: Payer: Self-pay | Admitting: *Deleted

## 2016-11-14 ENCOUNTER — Encounter: Payer: Self-pay | Admitting: *Deleted

## 2016-11-14 NOTE — Telephone Encounter (Signed)
Lab results sent via MyChart.

## 2016-11-14 NOTE — Telephone Encounter (Signed)
-----   Message from Glendale Chardonika K Patel, DO sent at 11/14/2016 12:38 PM EDT ----- Please inform patient that her testing is normal and does not show any evidence of nerve or muscle injury.

## 2016-12-15 ENCOUNTER — Other Ambulatory Visit: Payer: Self-pay | Admitting: Certified Nurse Midwife

## 2016-12-15 NOTE — Telephone Encounter (Signed)
Medication refill request: Valtrex Last AEX:  08/06/16 DL Next AEX: 1/61/097/16/19  Last MMG (if hormonal medication request): n/a Refill authorized: 09/25/16 #30 w/1 refill; today please advise; DL out of office [id number]/19/18

## 2016-12-17 ENCOUNTER — Encounter: Payer: Self-pay | Admitting: Adult Health

## 2016-12-17 ENCOUNTER — Telehealth: Payer: Self-pay | Admitting: Family Medicine

## 2016-12-17 ENCOUNTER — Ambulatory Visit (INDEPENDENT_AMBULATORY_CARE_PROVIDER_SITE_OTHER): Payer: Managed Care, Other (non HMO) | Admitting: Adult Health

## 2016-12-17 VITALS — BP 103/68 | HR 76 | Temp 98.1°F | Ht 64.5 in | Wt 152.0 lb

## 2016-12-17 DIAGNOSIS — R300 Dysuria: Secondary | ICD-10-CM | POA: Diagnosis not present

## 2016-12-17 DIAGNOSIS — R109 Unspecified abdominal pain: Secondary | ICD-10-CM | POA: Diagnosis not present

## 2016-12-17 LAB — POCT URINALYSIS DIPSTICK
Bilirubin, UA: NEGATIVE
Blood, UA: NEGATIVE
Glucose, UA: NEGATIVE
Ketones, UA: NEGATIVE
Leukocytes, UA: NEGATIVE
NITRITE UA: NEGATIVE
PH UA: 6 (ref 5.0–8.0)
Protein, UA: NEGATIVE
Spec Grav, UA: 1.025 (ref 1.010–1.025)
UROBILINOGEN UA: 0.2 U/dL

## 2016-12-17 MED ORDER — CYCLOBENZAPRINE HCL 5 MG PO TABS: 5.0000 mg | ORAL_TABLET | Freq: Three times a day (TID) | ORAL | 0 refills | Status: DC | PRN

## 2016-12-17 NOTE — Assessment & Plan Note (Signed)
UA neg Push water

## 2016-12-17 NOTE — Progress Notes (Signed)
Subjective:    Patient ID: Gwendolyn LimaAmy Rachelle Debarr, female    DOB: 07/25/1980, 36 y.o.   MRN: [id number]  HPI:  Ms. Tomma LightningSneed is here for R flank pain that started yesterday, that is constant, described as a dull ache and rated 4/10.  She reports dysuria but she believes that is more r/t to IC than UTI.  She denies fever/night sweats/chills/malaise/N/V/D. She denies malodorous urine or blood in urine/stool. She denies frequency/urgency. She reports annual kidney stones and has a Insurance underwriterUrologist, however has not seen them in "forever". She has not taken anything for discomfort and reports sweeping wet leaves the day prior to onset of sx's.  Patient Care Team    Relationship Specialty Notifications Start End  Thomasene Lotpalski, Deborah, DO PCP - General Family Medicine  09/24/16   Chilton Siandolph, Tiffany, MD Attending Physician Cardiology  10/14/16   Jacqlyn KraussHaverstock, Christina L, MD Referring Physician Dermatology  10/14/16   Van ClinesAquino, Karen M, MD Consulting Physician Neurology  10/14/16   Verner CholLeonard, Deborah S, CNM Referring Physician Certified Nurse Midwife  10/14/16     Patient Active Problem List   Diagnosis Date Noted  . Flank pain 12/17/2016  . Dysuria 12/17/2016  . History of depression 10/13/2016  . h/o Generalized anxiety disorder with panic attacks 10/13/2016  . Family history of psychiatric disorder 10/13/2016  . Tingling in extremities 10/13/2016  . Exercise intolerance 10/13/2016  . Dizziness, nonspecific 10/13/2016  . Chest discomfort 08/29/2016  . Cardiac murmur 05/10/2014  . Allergic rhinitis 05/10/2014  . 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 Interstitial cystitis 04/25/2014     Past Medical History:  Diagnosis Date  . Abnormal Pap smear of cervix    2014 ASCUS HPV HR+, 2015 LSIL HPV HR+  . Depression   . GERD (gastroesophageal reflux disease)   . Heart murmur   . IBS (irritable bowel syndrome)   . Mitral valve prolapse   . MRSA (methicillin resistant Staphylococcus aureus)    infection on LT side of face,  and forehead (02/2011)  . Nephrolithiasis   . Shingles   . UTI (urinary tract infection)      Past Surgical History:  Procedure Laterality Date  . CESAREAN SECTION  2007 & 2011   x2  . COLPOSCOPY  [id number],2015  . ESOPHAGOGASTRODUODENOSCOPY  03/25/2011   Procedure: ESOPHAGOGASTRODUODENOSCOPY (EGD);  Surgeon: Barrie FolkJohn C Hayes, MD;  [facility];  Service: Endoscopy;  Laterality: N/A;  . SAVORY DILATION  03/25/2011   Procedure: SAVORY DILATION;  Surgeon: Barrie FolkJohn C Hayes, MD;  Location: [facility] END[facility]COPY;  Service: Endoscopy;  Laterality: N/A;  . TUBAL LIGATION  2011  . WISDOM TOOTH EXTRACTION  2003     Family History  Problem Relation Age of Onset  . Diabetes Mother        Type 2  . Cancer Maternal Grandmother   . Breast cancer Maternal Grandmother 50  . Cancer Maternal Grandfather   . Diabetes Maternal Grandfather        Type 1   . Lung cancer Maternal Grandfather   . Arthritis Father   . Other Sister        Agenesis of corpus collosum  . Heart attack Paternal Grandfather   . Lupus Paternal Aunt      Social History   Substance and Sexual Activity  Drug Use No     Social History   Substance and Sexual Activity  Alcohol Use Yes  . Alcohol/week: 0.0 oz   Comment: 1-2 a month     Social History  Tobacco Use  Smoking Status Never Smoker  Smokeless Tobacco Never Used     Outpatient Encounter Medications as of 12/17/2016  Medication Sig  . Cholecalciferol (VITAMIN D PO) Take by mouth daily.   . fexofenadine (ALLEGRA) 180 MG tablet Take 180 mg by mouth daily.  Marland Kitchen. GLYCINE PO Take 1 tablet by mouth daily.  Marland Kitchen. MACA ROOT PO Take 3 tablets by mouth See admin instructions. Takes everyday except when on period  . Magnesium 250 MG TABS Take 1 tablet by mouth daily.   . Probiotic Product (PROBIOTIC PO) Take 1 tablet by mouth daily.   Marland Kitchen. UNABLE TO FIND Take 1 tablet by mouth daily. magnesium & b12 po  . valACYclovir (VALTREX) 500 MG tablet TAKE 1 TABLET BY MOUTH DAILY FOR  SUPPRESSION, INCREASE TO TWICE A DAY FOR 3 DAYS AT ONSET OF OUTBREAK  . cyclobenzaprine (FLEXERIL) 5 MG tablet Take 1 tablet (5 mg total) by mouth 3 (three) times daily as needed for muscle spasms.   No facility-administered encounter medications on file as of 12/17/2016.     Allergies: Other; Quinolones; and Sulfa antibiotics  Body mass index is 25.69 kg/m.  Blood pressure 103/68, pulse 76, temperature 98.1 F (36.7 C), temperature source Oral, height 5' 4.5" (1.638 m), weight 152 lb (68.9 kg), last menstrual period 11/26/2016.     Review of Systems  Constitutional: Positive for activity change and fatigue. Negative for appetite change, chills, diaphoresis, fever and unexpected weight change.  Respiratory: Negative for cough, chest tightness, shortness of breath, wheezing and stridor.   Cardiovascular: Negative for chest pain, palpitations and leg swelling.  Genitourinary: Positive for dysuria and flank pain. Negative for difficulty urinating, frequency, hematuria, pelvic pain and urgency.  Hematological: Does not bruise/bleed easily.       Objective:   Physical Exam  Constitutional: She appears well-developed and well-nourished. No distress.  Cardiovascular: Normal rate, regular rhythm, normal heart sounds and intact distal pulses.  No murmur heard. Pulmonary/Chest: Effort normal and breath sounds normal. No respiratory distress. She has no wheezes. She has no rales. She exhibits no tenderness.  Abdominal: Soft. Bowel sounds are normal. She exhibits no distension and no mass. There is no tenderness. There is CVA tenderness. There is no rebound and no guarding.  Musculoskeletal: She exhibits tenderness.       Cervical back: Normal.       Thoracic back: She exhibits tenderness. She exhibits normal range of motion.       Lumbar back: Normal.       Back:  TTP with spasm appreciated  Skin: She is not diaphoretic.          Assessment & Plan:   1. Flank pain   2. Dysuria      Dysuria UA neg Push water  Flank pain Likely pulled muscle Epson salt baths, heating pad, back exercises, and Cyclobenzaprine 5mg  Q8H PRN. If symptoms do not improve in 1 week please call clinic.    FOLLOW-UP:  Return if symptoms worsen or fail to improve.

## 2016-12-17 NOTE — Patient Instructions (Signed)
Back Exercises The following exercises strengthen the muscles that help to support the back. They also help to keep the lower back flexible. Doing these exercises can help to prevent back pain or lessen existing pain. If you have back pain or discomfort, try doing these exercises 2-3 times each day or as told by your health care provider. When the pain goes away, do them once each day, but increase the number of times that you repeat the steps for each exercise (do more repetitions). If you do not have back pain or discomfort, do these exercises once each day or as told by your health care provider. Exercises Single Knee to Chest  Repeat these steps 3-5 times for each leg: 1. Lie on your back on a firm bed or the floor with your legs extended. 2. Bring one knee to your chest. Your other leg should stay extended and in contact with the floor. 3. Hold your knee in place by grabbing your knee or thigh. 4. Pull on your knee until you feel a gentle stretch in your lower back. 5. Hold the stretch for 10-30 seconds. 6. Slowly release and straighten your leg.  Pelvic Tilt  Repeat these steps 5-10 times: 1. Lie on your back on a firm bed or the floor with your legs extended. 2. Bend your knees so they are pointing toward the ceiling and your feet are flat on the floor. 3. Tighten your lower abdominal muscles to press your lower back against the floor. This motion will tilt your pelvis so your tailbone points up toward the ceiling instead of pointing to your feet or the floor. 4. With gentle tension and even breathing, hold this position for 5-10 seconds.  Cat-Cow  Repeat these steps until your lower back becomes more flexible: 1. Get into a hands-and-knees position on a firm surface. Keep your hands under your shoulders, and keep your knees under your hips. You may place padding under your knees for comfort. 2. Let your head hang down, and point your tailbone toward the floor so your lower back  becomes rounded like the back of a cat. 3. Hold this position for 5 seconds. 4. Slowly lift your head and point your tailbone up toward the ceiling so your back forms a sagging arch like the back of a cow. 5. Hold this position for 5 seconds.  Press-Ups  Repeat these steps 5-10 times: 1. Lie on your abdomen (face-down) on the floor. 2. Place your palms near your head, about shoulder-width apart. 3. While you keep your back as relaxed as possible and keep your hips on the floor, slowly straighten your arms to raise the top half of your body and lift your shoulders. Do not use your back muscles to raise your upper torso. You may adjust the placement of your hands to make yourself more comfortable. 4. Hold this position for 5 seconds while you keep your back relaxed. 5. Slowly return to lying flat on the floor.  Bridges  Repeat these steps 10 times: 1. Lie on your back on a firm surface. 2. Bend your knees so they are pointing toward the ceiling and your feet are flat on the floor. 3. Tighten your buttocks muscles and lift your buttocks off of the floor until your waist is at almost the same height as your knees. You should feel the muscles working in your buttocks and the back of your thighs. If you do not feel these muscles, slide your feet 1-2 inches farther away   from your buttocks. 4. Hold this position for 3-5 seconds. 5. Slowly lower your hips to the starting position, and allow your buttocks muscles to relax completely.  If this exercise is too easy, try doing it with your arms crossed over your chest. Abdominal Crunches  Repeat these steps 5-10 times: 1. Lie on your back on a firm bed or the floor with your legs extended. 2. Bend your knees so they are pointing toward the ceiling and your feet are flat on the floor. 3. Cross your arms over your chest. 4. Tip your chin slightly toward your chest without bending your neck. 5. Tighten your abdominal muscles and slowly raise your  trunk (torso) high enough to lift your shoulder blades a tiny bit off of the floor. Avoid raising your torso higher than that, because it can put too much stress on your low back and it does not help to strengthen your abdominal muscles. 6. Slowly return to your starting position.  Back Lifts Repeat these steps 5-10 times: 1. Lie on your abdomen (face-down) with your arms at your sides, and rest your forehead on the floor. 2. Tighten the muscles in your legs and your buttocks. 3. Slowly lift your chest off of the floor while you keep your hips pressed to the floor. Keep the back of your head in line with the curve in your back. Your eyes should be looking at the floor. 4. Hold this position for 3-5 seconds. 5. Slowly return to your starting position.  Contact a health care provider if:  Your back pain or discomfort gets much worse when you do an exercise.  Your back pain or discomfort does not lessen within 2 hours after you exercise. If you have any of these problems, stop doing these exercises right away. Do not do them again unless your health care provider says that you can. Get help right away if:  You develop sudden, severe back pain. If this happens, stop doing the exercises right away. Do not do them again unless your health care provider says that you can. This information is not intended to replace advice given to you by your health care provider. Make sure you discuss any questions you have with your health care provider. Document Released: 02/21/2004 Document Revised: 05/23/2015 Document Reviewed: 03/09/2014 Elsevier Interactive Patient Education  2017 [employer]  Likely pulled muscle from yard work. Epson salt baths, heating pad, back exercises, and Cyclobenzaprine 5mg  Q8H PRN. If symptoms do not improve in 1 week please call clinic. FEEL BETTER! HAPPY THANKSGIVING!

## 2016-12-17 NOTE — Progress Notes (Signed)
Reviewed with pt during OV

## 2016-12-17 NOTE — Assessment & Plan Note (Signed)
Likely pulled muscle Epson salt baths, heating pad, back exercises, and Cyclobenzaprine 5mg  Q8H PRN. If symptoms do not improve in 1 week please call clinic.

## 2016-12-17 NOTE — Telephone Encounter (Signed)
Called and spoke to the patient.  Patient is having right sided low back pain that radiates into her right side that started last night. Pain is intermittent with a pain level as high as 5 out of 10 at times and varies from dull to sharp pains. Patient has had some stomach issues with diarrhea x 3 weeks and has been taking Pepto for her symptoms.  Patient states that stools have been dark to black in color since taking the Pepto. Patient states that diarrhea has stopped 3 days ago. Per patient she has history of kidney stones in the past and the pain feels the same just not as severe. Patient denies fevers, nausea, or vomiting.   Spoke to William HamburgerKaty Danford, NP patient placed on schedule for 215 pm acute appointment today. MPulliam, CMA/RT(R)

## 2016-12-17 NOTE — Telephone Encounter (Signed)
Pt cld states  Hx of kidney stones and thinks is having pain on front & right side thinks it is a possible stone--pls call her at  [phone]

## 2017-02-27 ENCOUNTER — Other Ambulatory Visit: Payer: Self-pay | Admitting: Obstetrics and Gynecology

## 2017-02-27 DIAGNOSIS — B009 Herpesviral infection, unspecified: Secondary | ICD-10-CM

## 2017-02-27 NOTE — Telephone Encounter (Signed)
Medication refill request: Valacyclovir 500mg  #30 Last AEX:  08-06-16 Next AEX: 08-11-17 Last MMG (if hormonal medication request): N/A Refill authorized: Please advise

## 2017-03-11 ENCOUNTER — Ambulatory Visit: Payer: Managed Care, Other (non HMO) | Admitting: Rheumatology

## 2017-03-19 ENCOUNTER — Telehealth: Payer: Self-pay | Admitting: Certified Nurse Midwife

## 2017-03-19 NOTE — Telephone Encounter (Signed)
Spoke with patient. Reports "really small bump", just inside right labia. Appeared 8 months ago. Only notices the "bump" when she touches it.   Denies pain or any other GYN concerns/complaints.   Patient request to have area checked since it has not resolved.   OV scheduled for 03/20/17 at 11am with Leota Sauerseborah Leonard, CNM. Patient verbalizes understanding and is agreeable.   Routing to provider for final review. Patient is agreeable to disposition. Will close encounter.

## 2017-03-19 NOTE — Telephone Encounter (Signed)
Patient says she can feel a knot inside her vagina.

## 2017-03-20 ENCOUNTER — Ambulatory Visit: Payer: Managed Care, Other (non HMO) | Admitting: Certified Nurse Midwife

## 2017-03-20 ENCOUNTER — Other Ambulatory Visit: Payer: Self-pay

## 2017-03-20 ENCOUNTER — Encounter: Payer: Self-pay | Admitting: Certified Nurse Midwife

## 2017-03-20 VITALS — BP 102/68 | HR 60 | Resp 16 | Ht 64.5 in | Wt 151.0 lb

## 2017-03-20 DIAGNOSIS — L723 Sebaceous cyst: Secondary | ICD-10-CM

## 2017-03-20 NOTE — Patient Instructions (Signed)
Epidermal Cyst An epidermal cyst is a small, painless lump under your skin. It may be called an epidermal inclusion cyst or an infundibular cyst. The cyst contains a grayish-white, bad-smelling substance (keratin). It is important not to pop epidermal cysts yourself. These cysts are usually harmless (benign), but they can get infected. Symptoms of infection may include:  Redness.  Inflammation.  Tenderness.  Warmth.  Fever.  A grayish-white, bad-smelling substance draining from the cyst.  Pus draining from the cyst.  Follow these instructions at home:  Take over-the-counter and prescription medicines only as told by your doctor.  If you were prescribed an antibiotic, use it as told by your doctor. Do not stop using the antibiotic even if you start to feel better.  Keep the area around your cyst clean and dry.  Wear loose, dry clothing.  Do not try to pop your cyst.  Avoid touching your cyst.  Check your cyst every day for signs of infection.  Keep all follow-up visits as told by your doctor. This is important. How is this prevented?  Wear clean, dry, clothing.  Avoid wearing tight clothing.  Keep your skin clean and dry. Shower or take baths every day.  Wash your body with a benzoyl peroxide wash when you shower or bathe. Contact a health care provider if:  Your cyst has symptoms of infection.  Your condition is not improving or is getting worse.  You have a cyst that looks different from other cysts you have had.  You have a fever. Get help right away if:  Redness spreads from the cyst into the surrounding area. This information is not intended to replace advice given to you by your health care provider. Make sure you discuss any questions you have with your health care provider. Document Released: 02/21/2004 Document Revised: 09/12/2015 Document Reviewed: 11/15/2014 Elsevier Interactive Patient Education  2018 [employer]vier Inc.  

## 2017-03-20 NOTE — Progress Notes (Signed)
37 y.o. Married Caucasian female G2P2002 here with complaint of small bump on left labia. Non tender, no redness, no discharge, no injury. Has been present for several months. Only becomes sore if  squeeze area, so "I stopped". No change in personal products, or vaginal symptoms. No other concerns today.  ROS Pertinent to HPI  O:Healthy female WDWN Affect: normal, orientation x 3  Exam:Skin warm and dry Abdomen: soft, non tender  Inguinal Lymph node : no enlargement or tenderness Pelvic exam: External genital: normal female, no lesions, no blisters, on mid left labia tiny sebaceous cyst noted, no redness , tenderness or exudate present, Right labia normal BUS: negative Declined pelvic exam, just external   A:Normal external genital findings Left labia sebaceous cyst   P:Discussed findings of sebaceous cyst and benign etiology. Discussed Aveeno or baking soda sitz bath for comfort, if becomes tender.Avoid squeezing area, will either resolve or just be present with no problems. Questions addressed.. Relieved no issues. Printed handout given on sebaceous cyst.  Rv prn

## 2017-03-25 NOTE — Progress Notes (Deleted)
Office Visit Note  Patient: Emily [name] Rachelle Kuklinski             Date of Birth: [birth year]           MRN: [id number]             PCP: Thomasene Lotpalski, Deborah, DO Referring: Thomasene Lotpalski, Deborah, DO Visit Date: 04/08/2017 Occupation: @GUAROCC @    Subjective:  No chief complaint on file.   History of Present Illness: Emily Selina CooleyRachelle Love is a 37 y.o. female ***   Activities of Daily Living:  Patient reports morning stiffness for *** {minute/hour:19697}.   Patient {ACTIONS;DENIES/REPORTS:21021675::"Denies"} nocturnal pain.  Difficulty dressing/grooming: {ACTIONS;DENIES/REPORTS:21021675::"Denies"} Difficulty climbing stairs: {ACTIONS;DENIES/REPORTS:21021675::"Denies"} Difficulty getting out of chair: {ACTIONS;DENIES/REPORTS:21021675::"Denies"} Difficulty using hands for taps, buttons, cutlery, and/or writing: {ACTIONS;DENIES/REPORTS:21021675::"Denies"}   No Rheumatology ROS completed.   PMFS History:  Patient Active Problem List   Diagnosis Date Noted  . Flank pain 12/17/2016  . Dysuria 12/17/2016  . History of depression 10/13/2016  . h/o Generalized anxiety disorder with panic attacks 10/13/2016  . Family history of psychiatric disorder 10/13/2016  . Tingling in extremities 10/13/2016  . Exercise intolerance 10/13/2016  . Dizziness, nonspecific 10/13/2016  . Chest discomfort 08/29/2016  . Cardiac murmur 05/10/2014  . Allergic rhinitis 05/10/2014  . 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 Interstitial cystitis 04/25/2014    Past Medical History:  Diagnosis Date  . Abnormal Pap smear of cervix    2014 ASCUS HPV HR+, 2015 LSIL HPV HR+  . Depression   . GERD (gastroesophageal reflux disease)   . Heart murmur   . IBS (irritable bowel syndrome)   . Mitral valve prolapse   . MRSA (methicillin resistant Staphylococcus aureus)    infection on LT side of face, and forehead (02/2011)  . Nephrolithiasis   . Shingles   . UTI (urinary tract infection)     Family History  Problem Relation Age of  Onset  . Diabetes Mother        Type 2  . Cancer Maternal Grandmother   . Breast cancer Maternal Grandmother 50  . Cancer Maternal Grandfather   . Diabetes Maternal Grandfather        Type 1   . Lung cancer Maternal Grandfather   . Arthritis Father   . Other Sister        Agenesis of corpus collosum  . Heart attack Paternal Grandfather   . Lupus Paternal Aunt    Past Surgical History:  Procedure Laterality Date  . CESAREAN SECTION  2007 & 2011   x2  . COLPOSCOPY  [id number],2015  . ESOPHAGOGASTRODUODENOSCOPY  03/25/2011   Procedure: ESOPHAGOGASTRODUODENOSCOPY (EGD);  Surgeon: Barrie FolkJohn C Hayes, MD;  [facility];  Service: Endoscopy;  Laterality: N/A;  . SAVORY DILATION  03/25/2011   Procedure: SAVORY DILATION;  Surgeon: Barrie FolkJohn C Hayes, MD;  [facility];  Service: Endoscopy;  Laterality: N/A;  . TUBAL LIGATION  2011  . WISDOM TOOTH EXTRACTION  2003   Social History   Social History Narrative   Lives with husband and 2 sons in a 3 story home.     Works with adults with developmental disabilities.     Education: BS.     Objective: Vital Signs: LMP 03/14/2017 (Exact Date)    Physical Exam   Musculoskeletal Exam: ***  CDAI Exam: No CDAI exam completed.    Investigation: No additional findings.   Component     Latest Ref Rng & Units 10/15/2016  Triiodothyronine,Free,Serum     2.0 - 4.4 pg/mL  2.9  T4,Free(Direct)     0.82 - 1.77 ng/dL 7.82  Magnesium     1.6 - 2.3 mg/dL 2.2  Phosphorus     2.5 - 4.5 mg/dL 2.4 (L)  CK Total     29 - 143 U/L   Aldolase     < OR = 8.1 U/L   Copper     70 - 175 mcg/dL   Vitamin N56     213 - 1,100 pg/mL   Folate     ng/mL    CBC Latest Ref Rng & Units 09/12/2016 08/06/2016 09/12/2015  WBC 4.0 - 10.5 K/uL 5.9 6.6 6.8  Hemoglobin 12.0 - 15.0 g/dL 08.6 57.8 46.9  Hematocrit 36.0 - 46.0 % 38.3 39.3 39.4  Platelets 150 - 400 K/uL 234 221 228   CMP Latest Ref Rng & Units 09/13/2016 09/12/2016 08/29/2014  Glucose 65 - 99  mg/dL - 87 -  BUN 6 - 20 mg/dL - 12 17  Creatinine 6.29 - 1.00 mg/dL - 5.28 0.7  Sodium 413 - 145 mmol/L - 138 138  Potassium 3.5 - 5.1 mmol/L - 3.7 4.3  Chloride 101 - 111 mmol/L - 104 -  CO2 22 - 32 mmol/L - 27 -  Calcium 8.9 - 10.3 mg/dL - 9.0 -  Total Protein 6.5 - 8.1 g/dL 7.1 - -  Total Bilirubin 0.3 - 1.2 mg/dL 0.6 - -  Alkaline Phos 38 - 126 U/L 53 - 55  AST 15 - 41 U/L 18 - 13  ALT 14 - 54 U/L 18 - 10    Imaging: No results found.  Speciality Comments: No specialty comments available.    Procedures:  No procedures performed Allergies: Other; Quinolones; and Sulfa antibiotics   Assessment / Plan:     Visit Diagnoses: Myalgia  Weakness    Orders: No orders of the defined types were placed in this encounter.  No orders of the defined types were placed in this encounter.   Face-to-face time spent with patient was *** minutes. 50% of time was spent in counseling and coordination of care.  Follow-Up Instructions: No Follow-up on file.   Gearldine Bienenstock, PA-C  Note - This record has been created using Dragon software.  Chart creation errors have been sought, but may not always  have been located. Such creation errors do not reflect on  the standard of medical care.

## 2017-04-08 ENCOUNTER — Ambulatory Visit: Payer: Managed Care, Other (non HMO) | Admitting: Rheumatology

## 2017-04-09 ENCOUNTER — Ambulatory Visit: Payer: Managed Care, Other (non HMO) | Admitting: Rheumatology

## 2017-05-11 ENCOUNTER — Ambulatory Visit: Payer: Managed Care, Other (non HMO) | Admitting: Rheumatology

## 2017-08-11 ENCOUNTER — Ambulatory Visit: Payer: Managed Care, Other (non HMO) | Admitting: Certified Nurse Midwife

## 2017-08-20 ENCOUNTER — Other Ambulatory Visit: Payer: Self-pay | Admitting: Certified Nurse Midwife

## 2017-08-20 DIAGNOSIS — B009 Herpesviral infection, unspecified: Secondary | ICD-10-CM

## 2017-08-20 NOTE — Telephone Encounter (Signed)
Medication refill request: Valtrex 500 mg  Last AEX:  08/06/16 Next AEX: 10/29/17 Last MMG (if hormonal medication request): na Refill authorized: Please refill if appropriate.

## 2017-09-03 ENCOUNTER — Other Ambulatory Visit: Payer: Self-pay | Admitting: Certified Nurse Midwife

## 2017-09-03 NOTE — Telephone Encounter (Signed)
Medication refill request: macrobid Last AEX:  08/06/2016 Next AEX: 10/29/2017 Last MMG (if hormonal medication request): n/a Refill authorized: #30, 1 refill, please advise

## 2017-09-03 NOTE — Telephone Encounter (Signed)
Medication refill request: Microbid 100mg  Last AEX:  08/06/16 Next AEX: 10/29/17 Last MMG (if hormonal medication request): NA Refill authorized: Please advise

## 2017-10-23 ENCOUNTER — Telehealth: Payer: Self-pay | Admitting: Certified Nurse Midwife

## 2017-10-23 ENCOUNTER — Ambulatory Visit: Payer: Managed Care, Other (non HMO) | Admitting: Certified Nurse Midwife

## 2017-10-23 NOTE — Telephone Encounter (Signed)
Patient rescheduled her aex from today because her cycle is heavy.

## 2017-10-29 ENCOUNTER — Ambulatory Visit: Payer: Managed Care, Other (non HMO) | Admitting: Certified Nurse Midwife

## 2017-11-17 ENCOUNTER — Encounter: Payer: Managed Care, Other (non HMO) | Admitting: Family Medicine

## 2017-11-18 ENCOUNTER — Encounter: Payer: Self-pay | Admitting: Certified Nurse Midwife

## 2017-11-18 ENCOUNTER — Other Ambulatory Visit: Payer: Self-pay

## 2017-11-18 ENCOUNTER — Encounter: Payer: Managed Care, Other (non HMO) | Admitting: Family Medicine

## 2017-11-18 ENCOUNTER — Other Ambulatory Visit (HOSPITAL_COMMUNITY)
Admission: RE | Admit: 2017-11-18 | Discharge: 2017-11-18 | Disposition: A | Discharge status: Home / Self Care | Payer: Managed Care, Other (non HMO) | Source: Ambulatory Visit | Attending: Obstetrics & Gynecology | Admitting: Obstetrics & Gynecology

## 2017-11-18 ENCOUNTER — Ambulatory Visit: Payer: Managed Care, Other (non HMO) | Admitting: Certified Nurse Midwife

## 2017-11-18 VITALS — BP 100/70 | HR 70 | Resp 16 | Ht 64.5 in | Wt 152.0 lb

## 2017-11-18 DIAGNOSIS — B009 Herpesviral infection, unspecified: Secondary | ICD-10-CM | POA: Diagnosis not present

## 2017-11-18 DIAGNOSIS — Z124 Encounter for screening for malignant neoplasm of cervix: Secondary | ICD-10-CM | POA: Insufficient documentation

## 2017-11-18 DIAGNOSIS — N943 Premenstrual tension syndrome: Secondary | ICD-10-CM

## 2017-11-18 DIAGNOSIS — Z01419 Encounter for gynecological examination (general) (routine) without abnormal findings: Secondary | ICD-10-CM

## 2017-11-18 MED ORDER — VALACYCLOVIR HCL 1 G PO TABS: ORAL_TABLET | ORAL | 12 refills | Status: DC

## 2017-11-18 NOTE — Progress Notes (Signed)
37 y.o. G49P2002 Married  Caucasian Fe here for annual exam. Patient feels like she is having more PMS with crying, anger,uncontrolled anxiety happens 7 days prior to period starts. Not sure she would use medication, but considering. Social stress with family is still present, but sons are in stable situation. Started period early by one day today. Continues to have HSV 1 outbreaks. Using Valtrex, will need up to date. No other health issues today. Has PCP appointment with labs today also.  Patient's last menstrual period was 11/18/2017.          Sexually active: Yes.    The current method of family planning is tubal ligation.    Exercising: Yes.    cardio & weights Smoker:  no  Review of Systems  Constitutional: Negative.   HENT: Negative.   Eyes: Negative.   Respiratory: Negative.   Cardiovascular:       Heart murmur  Gastrointestinal: Negative.   Genitourinary: Negative.   Musculoskeletal:       Muscle/joint pain  Skin: Negative.   Neurological: Negative.   Endo/Heme/Allergies: Negative.   Psychiatric/Behavioral:       Pms    Health Maintenance: Pap:  01-06-14 ASCUS HPV HR +, 08-06-15 neg HPV HR neg, 08-06-16 neg History of Abnormal Pap: yes MMG:  none Self Breast exams: occ Colonoscopy:  none BMD:   none TDaP:  2018 Shingles: no Pneumonia: no Hep C and HIV: both neg 2014 Labs: PCP   reports that she has never smoked. She has never used smokeless tobacco. She reports that she drinks alcohol. She reports that she does not use drugs.  Past Medical History:  Diagnosis Date  . Abnormal Pap smear of cervix    2014 ASCUS HPV HR+, 2015 LSIL HPV HR+  . Depression   . GERD (gastroesophageal reflux disease)   . Heart murmur   . IBS (irritable bowel syndrome)   . Mitral valve prolapse   . MRSA (methicillin resistant Staphylococcus aureus)    infection on LT side of face, and forehead (02/2011)  . Nephrolithiasis   . Shingles   . UTI (urinary tract infection)     Past  Surgical History:  Procedure Laterality Date  . CESAREAN SECTION  2007 & 2011   x2  . COLPOSCOPY  [id number]  . ESOPHAGOGASTRODUODENOSCOPY  03/25/2011   Procedure: ESOPHAGOGASTRODUODENOSCOPY (EGD);  Surgeon: Barrie Folk, MD;  Location: [facility] ENDOSCOPY;  Service: Endoscopy;  Laterality: N/A;  . SAVORY DILATION  03/25/2011   Procedure: SAVORY DILATION;  Surgeon: Barrie Folk, MD;  Location: [facility] ENDOSCOPY;  Service: Endoscopy;  Laterality: N/A;  . TUBAL LIGATION  2011  . WISDOM TOOTH EXTRACTION  2003    Current Outpatient Medications  Medication Sig Dispense Refill  . cyclobenzaprine (FLEXERIL) 5 MG tablet Take 1 tablet (5 mg total) by mouth 3 (three) times daily as needed for muscle spasms. 15 tablet 0  . fexofenadine (ALLEGRA) 180 MG tablet Take 180 mg by mouth daily.    . Probiotic Product (PROBIOTIC PO) Take 1 tablet by mouth daily.     . valACYclovir (VALTREX) 500 MG tablet TAKE 1 TABLET EVERY DAY FOR SUPRESSION THEN INCREASE TO TWICE A DAY FOR 3 DAYS AT ONSET OF OUTBREAK 30 tablet 12   No current facility-administered medications for this visit.     Family History  Problem Relation Age of Onset  . Diabetes Mother        Type 2  . Cancer Maternal Grandmother   .  Breast cancer Maternal Grandmother 50  . Cancer Maternal Grandfather   . Diabetes Maternal Grandfather        Type 1   . Lung cancer Maternal Grandfather   . Arthritis Father   . Other Sister        Agenesis of corpus collosum  . Heart attack Paternal Grandfather   . Lupus Paternal Aunt     ROS:  Pertinent items are noted in HPI.  Otherwise, a comprehensive ROS was negative.  Exam:   BP 100/70   Pulse 70   Resp 16   Ht 5' 4.5" (1.638 m)   Wt 152 lb (68.9 kg)   LMP 11/18/2017   BMI 25.69 kg/m  Height: 5' 4.5" (163.8 cm) Ht Readings from Last 3 Encounters:  11/18/17 5' 4.5" (1.638 m)  03/20/17 5' 4.5" (1.638 m)  12/17/16 5' 4.5" (1.638 m)    General appearance: alert, cooperative and appears stated  age Head: Normocephalic, without obvious abnormality, atraumatic Neck: no adenopathy, supple, symmetrical, trachea midline and thyroid normal to inspection and palpation Lungs: clear to auscultation bilaterally Breasts: normal appearance, no masses or tenderness, No nipple retraction or dimpling, No nipple discharge or bleeding, No axillary or supraclavicular adenopathy Heart: regular rate and rhythm Abdomen: soft, non-tender; no masses,  no organomegaly Extremities: extremities normal, atraumatic, no cyanosis or edema Skin: Skin color, texture, turgor normal. No rashes or lesions Lymph nodes: Cervical, supraclavicular, and axillary nodes normal. No abnormal inguinal nodes palpated Neurologic: Grossly normal   Pelvic: External genitalia:  no lesions              Urethra:  normal appearing urethra with no masses, tenderness or lesions              Bartholin's and Skene's: normal                 Vagina: normal appearing vagina with normal color and discharge, no lesions              Cervix: no cervical motion tenderness, no lesions and normal appearance              Pap taken: Yes.   Bimanual Exam:  Uterus:  normal size, contour, position, consistency, mobility, non-tender and anteverted              Adnexa: normal adnexa and no mass, fullness, tenderness               Rectovaginal: Confirms               Anus:  normal sphincter tone, no lesions, hemorrhoid tag only  Chaperone present: yes  A:  Well Woman with normal exam  Contraception tubal ligation  PMS short term prior to period  HSV 1 outbreaks, needs Rx update  Previous Post coital UTI used Macrobid with good results, not needed now  P:   Reviewed health and wellness pertinent to exam  Discussed Rx medication use for or trial of B complex 2 weeks prior to menses. Patient would prefer trial of B complex to see if this will address her symptoms. She will advise if no change.  Rx Valtrex 2 gm bid x 1 day see order with  instructions  Pap smear: yes   counseled on breast self exam, feminine hygiene, adequate intake of calcium and vitamin D, diet and exercise  return annually or prn  An After Visit Summary was printed and given to the patient.

## 2017-11-20 ENCOUNTER — Encounter: Payer: Self-pay | Admitting: Family Medicine

## 2017-11-20 ENCOUNTER — Ambulatory Visit (INDEPENDENT_AMBULATORY_CARE_PROVIDER_SITE_OTHER): Payer: Managed Care, Other (non HMO) | Admitting: Family Medicine

## 2017-11-20 VITALS — BP 123/85 | HR 75 | Ht 65.0 in | Wt 153.5 lb

## 2017-11-20 DIAGNOSIS — Z719 Counseling, unspecified: Secondary | ICD-10-CM | POA: Diagnosis not present

## 2017-11-20 DIAGNOSIS — Z0000 Encounter for general adult medical examination without abnormal findings: Secondary | ICD-10-CM

## 2017-11-20 LAB — CYTOLOGY - PAP: Diagnosis: NEGATIVE

## 2017-11-20 NOTE — Progress Notes (Signed)
Impression and Recommendations:    1. Encounter for wellness examination   2. Healthcare maintenance   3. Health education/counseling     1) Anticipatory Guidance: Discussed importance of wearing a seatbelt while driving, not texting while driving; sunscreen when outside along with yearly skin surveillance; eating a well balanced and modest diet; physical activity at least 25 minutes per day or 150 min/ week of moderate to intense activity.  - Encouraged patient to improve nutrient density of diet through increasing intake of fruits and vegetables and decreasing saturated/trans fats, white flour products and refined sugar products.   - Encouraged patient to apply ice to alleviate areas of arthritis.  - Encouraged patient to use an exfoliant on areas of her skin that feel rough.  2) Immunizations / Screenings / Labs: All immunizations and screenings that patient agrees to, are up-to-date per recommendations or will be updated today.  Patient understands the needs for q 62mo dental and yearly vision screens which pt will schedule independently. Obtain CBC, CMP, HgA1c, Lipid panel, TSH and vit D when fasting if not already done recently.   - OBGYN Follow-Up - Recommended patient to continue follow-up regarding pap smears with OBGYN.  - Urology Follow-Up - Recommended that the patient continue follow-up with urology for her interstitial cystitis.  3) BMI Counseling - Discussed the importance of healthy, prudent habits, strengthening, and conditioning. - Emphasized feeling healthy and feeling self-love instead of focusing on losing weight.  - Encouraged patient to continue tracking her nutritional intake if she desires.  Explained to patient what BMI refers to, and what it means medically.    Told patient to think about it as a "medical risk stratification measurement" and how increasing BMI is associated with increasing risk/ or worsening state of various diseases such as  hypertension, hyperlipidemia, diabetes, premature OA, depression etc.  American Heart Association guidelines for healthy diet, basically Mediterranean diet, and exercise guidelines of 30 minutes 5 days per week or more discussed in detail.  Health counseling performed.  All questions answered.  4) Lifestyle & Preventative Health Maintenance - Advised patient to continue working toward exercising to improve overall mental, physical, and emotional health.    - Encouraged patient to engage in daily physical activity, especially a formal exercise routine.  Recommended that the patient eventually strive for at least 150 minutes of moderate cardiovascular activity per week according to guidelines established by the [facility].   - Healthy dietary habits encouraged, including low-carb, and high amounts of lean protein in diet.   - Patient should also consume adequate amounts of water.  5) Follow-Up - Need for blood work today. - Prescriptions refilled today PRN. - Re-check fasting lab work as recommended. - Otherwise, continue to return for CPE and chronic follow-up as scheduled.   - Patient knows to call in sooner if desired to address acute concerns. - Patient will follow up to discuss foot pain, hip pain, and/or weight loss strategies.    Emily [name] orders of the defined types were placed in this encounter.   Orders Placed This Encounter  Procedures  . CBC with Differential/Platelet  . Comprehensive metabolic panel  . Hemoglobin A1c  . Lipid panel  . T4, free  . TSH  . VITAMIN D 25 Hydroxy (Vit-D Deficiency, Fractures)    Gross side effects, risk and benefits, and alternatives of medications discussed with patient.  Patient is aware that all medications have potential side effects and we are unable to predict every side effect or  drug-drug interaction that may occur.  Expresses verbal understanding and consents to current therapy plan and treatment regimen.  F-up preventative CPE in 1 year.  F/up sooner for chronic care management as discussed and/or prn.  Please see orders placed and AVS handed out to patient at the end of our visit for further patient instructions/ counseling done pertaining to today's office visit.  This document serves as a record of services personally performed by Thomasene Lot, DO. It was created on her behalf by Peggye Fothergill, a trained medical scribe. The creation of this record is based on the scribe's personal observations and the provider's statements to them.   I have reviewed the above medical documentation for accuracy and completeness and I concur.  Thomasene Lot 11/24/17 12:53 PM    Subjective:    Chief Complaint  Patient presents with  . Annual Exam   CC:   HPI: Emily Emily [name] is a 37 y.o. female who presents to [facility] today a yearly health maintenance exam.  Health Maintenance Summary Reviewed and updated, unless pt declines services.  Colonoscopy:  Unnecessary since patient is under the age of 5. Tobacco History Reviewed:   Y; never smoker. Alcohol:   Emily [name] concerns, Emily [name] excessive use. Exercise Habits:   Not meeting AHA guidelines. Was exercising 3 times per week. Hasn't been exercising in the last two weeks.   But was exercising 4-5 times per week, was on keto. Was doing great, eating lean meat, and states "but I don't ever lose weight." STD concerns:   None. Drug Use:   None Birth control method:   Tubal ligation. Menses regular:    Yes. Lumps or breast concerns:  Emily [name]. Breast Cancer Family History:  Emily [name].  OBGYN - Female Health Patient states she had HPV (on pap smear) three years in a row. This year, if her pap smear is clear, will be the third clear smear. If this next smear is clear, she won't have to have pap smear every year. Patient continues to follow up with OBGYN.  Insterstitial Cystitis Notes she has it a lot of times right before her period. Patient takes an  antibiotic "if she's feeling something," and every time after sex. And takes antibiotics every time she has a flare-up.  Skin Health Patient goes in every couple of years to get checked out by dermatology.  Visual Health Last eye exam was two years ago. Patient wears glasses to drive sometimes. Notes she notices it more if it's rainy or dark. Day to day, she doesn't notice her prescription.  Dental Health Visits dentist every six months.  Feet Pain Notes that her feet always hurt.  Denies pain in her feet in the morning. States that her feet tend to hurt at the end of a day, especially after a long day of walking. She takes tylenol if the pain gets bad.    Has also been experiencing some hip pain at night while sleeping.  Cardiology Follow-Up Patient had an echocardiogram done 09/02/2016. Heart health was normal.  Denies chest pain, SOB, dizziness, nausea, vomiting, diarrhea, intolerances, constipation.  Patient notes she has always struggled with IBS.  Denies issues with urination, unless she's experiencing some of her interstitial cystitis.   Immunization History  Administered Date(s) Administered  . Influenza Inj Mdck Quad Pf 10/10/2017  . Tdap 08/06/2016    Health Maintenance  Topic Date Due  . PAP SMEAR  11/18/2020  . TETANUS/TDAP  08/07/2026  . INFLUENZA  VACCINE  Completed  . HIV Screening  Completed     Wt Readings from Last 3 Encounters:  11/20/17 153 lb 8 oz (69.6 kg)  11/18/17 152 lb (68.9 kg)  03/20/17 151 lb (68.5 kg)   BP Readings from Last 3 Encounters:  11/20/17 123/85  11/18/17 100/70  03/20/17 102/68   Pulse Readings from Last 3 Encounters:  11/20/17 75  11/18/17 70  03/20/17 60     Past Medical History:  Diagnosis Date  . Abnormal Pap smear of cervix    2014 ASCUS HPV HR+, 2015 LSIL HPV HR+  . Depression   . GERD (gastroesophageal reflux disease)   . Heart murmur   . IBS (irritable bowel syndrome)   . Mitral valve prolapse   .  MRSA (methicillin resistant Staphylococcus aureus)    infection on LT side of face, and forehead (02/2011)  . Nephrolithiasis   . Shingles   . UTI (urinary tract infection)       Past Surgical History:  Procedure Laterality Date  . CESAREAN SECTION  2007 & 2011   x2  . COLPOSCOPY  [id number]  . ESOPHAGOGASTRODUODENOSCOPY  03/25/2011   Procedure: ESOPHAGOGASTRODUODENOSCOPY (EGD);  Surgeon: Barrie Folk, MD;  Location: [facility] ENDOSCOPY;  Service: Endoscopy;  Laterality: N/A;  . SAVORY DILATION  03/25/2011   Procedure: SAVORY DILATION;  Surgeon: Barrie Folk, MD;  Location: [facility] ENDOSCOPY;  Service: Endoscopy;  Laterality: N/A;  . TUBAL LIGATION  2011  . WISDOM TOOTH EXTRACTION  2003      Family History  Problem Relation Age of Onset  . Diabetes Mother        Type 2  . Cancer Maternal Grandmother   . Breast cancer Maternal Grandmother 50  . Cancer Maternal Grandfather   . Diabetes Maternal Grandfather        Type 1   . Lung cancer Maternal Grandfather   . Arthritis Father   . Other Sister        Agenesis of corpus collosum  . Heart attack Paternal Grandfather   . Lupus Paternal Aunt       Social History   Substance and Sexual Activity  Drug Use Emily [name]  ,   Social History   Substance and Sexual Activity  Alcohol Use Yes  . Alcohol/week: 0.0 standard drinks   Comment: 2 a month  ,   Social History   Tobacco Use  Smoking Status Never Smoker  Smokeless Tobacco Never Used  ,   Social History   Substance and Sexual Activity  Sexual Activity Yes  . Partners: Male  . Birth control/protection: Surgical   Comment: Tubal    Current Outpatient Medications on File Prior to Visit  Medication Sig Dispense Refill  . cyclobenzaprine (FLEXERIL) 5 MG tablet Take 1 tablet (5 mg total) by mouth 3 (three) times daily as needed for muscle spasms. 15 tablet 0  . fexofenadine (ALLEGRA) 180 MG tablet Take 180 mg by mouth daily.    . Probiotic Product (PROBIOTIC PO) Take 1 tablet by  mouth daily.     . valACYclovir (VALTREX) 1000 MG tablet For oral HSV take two tablets twice daily x 1 day at onset of outbreak 30 tablet 12   Emily [name] current facility-administered medications on file prior to visit.     Allergies: Other; Quinolones; and Sulfa antibiotics  Review of Systems: General:   Denies fever, chills, unexplained weight loss.  Optho/Auditory:   Denies visual changes, blurred vision/LOV Respiratory:   Denies SOB,  DOE more than baseline levels.  Cardiovascular:   Denies chest pain, palpitations, new onset peripheral edema  Gastrointestinal:   Denies nausea, vomiting, diarrhea.  Genitourinary: Denies dysuria, freq/ urgency, flank pain or discharge from genitals.  Endocrine:     Denies hot or cold intolerance, polyuria, polydipsia. Musculoskeletal:   Denies unexplained myalgias, joint swelling, unexplained arthralgias, gait problems.  Skin:  Denies rash, suspicious lesions Neurological:     Denies dizziness, unexplained weakness, numbness  Psychiatric/Behavioral:   Denies mood changes, suicidal or homicidal ideations, hallucinations    Objective:    Blood pressure 123/85, pulse 75, height 5\' 5"  (1.651 m), weight 153 lb 8 oz (69.6 kg), last menstrual period 11/18/2017, SpO2 99 %. Body mass index is 25.54 kg/m. General Appearance:    Alert, cooperative, Emily [name] distress, appears stated age  Head:    Normocephalic, without obvious abnormality, atraumatic  Eyes:    PERRL, conjunctiva/corneas clear, EOM's intact, fundi    benign, both eyes  Ears:    Normal TM's and external ear canals, both ears  Nose:   Nares normal, septum midline, mucosa normal, Emily [name] drainage    or sinus tenderness  Throat:   Lips w/o lesion, mucosa moist, and tongue normal; teeth and   gums normal  Neck:   Supple, symmetrical, trachea midline, Emily [name] adenopathy;    thyroid:  Emily [name] enlargement/tenderness/nodules; Emily [name] carotid   bruit or JVD  Back:     Symmetric, Emily [name] curvature, ROM normal, Emily [name] CVA tenderness  Lungs:      Clear to auscultation bilaterally, respirations unlabored, Emily [name]       Wh/ R/ R  Chest Wall:    Emily [name] tenderness or gross deformity; normal excursion   Heart:    Regular rate and rhythm, S1 and S2 normal, holosystolic blowing murmur present, Emily [name] rub   or gallop  Breast Exam:   Deferred to OBGYN.  Abdomen:     Soft, non-tender, bowel sounds active all four quadrants, Emily [name]   G/R/R, Emily [name] masses, Emily [name] organomegaly  Genitalia:   Deferred to OBGYN.  Rectal:   Deferred to OBGYN.  Extremities:   Extremities normal, atraumatic, Emily [name] cyanosis or gross edema  Pulses:   2+ and symmetric all extremities  Skin:   Warm, dry, Skin color, texture, turgor normal, Emily [name] obvious rashes or lesions Psych: Emily [name] HI/SI, judgement and insight good, Euthymic mood. Full Affect.  Neurologic:   CNII-XII intact, normal strength, sensation and reflexes    Throughout

## 2017-11-20 NOTE — Patient Instructions (Addendum)
-Please follow-up near future if you would like to discuss foot pain and/or hip pain as well as weight loss strategies and would like support with that.    Please realize, EXERCISE IS MEDICINE!  -  American Heart Association [facility]) guidelines for exercise : If you are in good health, without any medical conditions, you should engage in 150-300 minutes of moderate intensity aerobic activity per week.  This means you should be huffing and puffing throughout your workout.   Engaging in regular exercise will improve brain function and memory, as well as improve mood, boost immune system and help with weight management.  As well as the other, more well-known effects of exercise such as decreasing blood sugar levels, decreasing blood pressure,  and decreasing bad cholesterol levels/ increasing good cholesterol levels.     -  The AHA strongly endorses consumption of a diet that contains a variety of foods from all the food categories with an emphasis on fruits and vegetables; fat-free and low-fat dairy products; cereal and grain products; legumes and nuts; and fish, poultry, and/or extra lean meats.    Excessive food intake, especially of foods high in saturated and trans fats, sugar, and salt, should be avoided.    Adequate water intake of roughly 1/2 of your weight in pounds, should equal the ounces of water per day you should drink.  So for instance, if you're 200 pounds, that would be 100 ounces of water per day.         Mediterranean Diet  Why follow it? Research shows. . Those who follow the Mediterranean diet have a reduced risk of heart disease  . The diet is associated with a reduced incidence of Parkinson's and Alzheimer's diseases . People following the diet may have longer life expectancies and lower rates of chronic diseases  . The Dietary Guidelines for Americans recommends the Mediterranean diet as an eating plan to promote health and prevent disease  What Is the Mediterranean Diet?   . Healthy eating plan based on typical foods and recipes of Mediterranean-style cooking . The diet is primarily a plant based diet; these foods should make up a majority of meals   Starches - Plant based foods should make up a majority of meals - They are an important sources of vitamins, minerals, energy, antioxidants, and fiber - Choose whole grains, foods high in fiber and minimally processed items  - Typical grain sources include wheat, oats, barley, corn, brown rice, bulgar, farro, millet, polenta, couscous  - Various types of beans include chickpeas, lentils, fava beans, black beans, white beans   Fruits  Veggies - Large quantities of antioxidant rich fruits & veggies; 6 or more servings  - Vegetables can be eaten raw or lightly drizzled with oil and cooked  - Vegetables common to the traditional Mediterranean Diet include: artichokes, arugula, beets, broccoli, brussel sprouts, cabbage, carrots, celery, collard greens, cucumbers, eggplant, kale, leeks, lemons, lettuce, mushrooms, okra, onions, peas, peppers, potatoes, pumpkin, radishes, rutabaga, shallots, spinach, sweet potatoes, turnips, zucchini - Fruits common to the Mediterranean Diet include: apples, apricots, avocados, cherries, clementines, dates, figs, grapefruits, grapes, melons, nectarines, oranges, peaches, pears, pomegranates, strawberries, tangerines  Fats - Replace butter and margarine with healthy oils, such as olive oil, canola oil, and tahini  - Limit nuts to no more than a handful a day  - Nuts include walnuts, almonds, pecans, pistachios, pine nuts  - Limit or avoid candied, honey roasted or heavily salted nuts - Olives are central to the  Mediterranean diet - can be eaten whole or used in a variety of dishes   Meats Protein - Limiting red meat: no more than a few times a month - When eating red meat: choose lean cuts and keep the portion to the size of deck of cards - Eggs: approx. 0 to 4 times a week  - Fish and lean  poultry: at least 2 a week  - Healthy protein sources include, chicken, [place], lean beef, lamb - Increase intake of seafood such as tuna, salmon, trout, mackerel, shrimp, scallops - Avoid or limit high fat processed meats such as sausage and bacon  Dairy - Include moderate amounts of low fat dairy products  - Focus on healthy dairy such as fat free yogurt, skim milk, low or reduced fat cheese - Limit dairy products higher in fat such as whole or 2% milk, cheese, ice cream  Alcohol - Moderate amounts of red wine is ok  - No more than 5 oz daily for women (all ages) and men older than age 31  - No more than 10 oz of wine daily for men younger than 1  Other - Limit sweets and other desserts  - Use herbs and spices instead of salt to flavor foods  - Herbs and spices common to the traditional Mediterranean Diet include: basil, bay leaves, chives, cloves, cumin, fennel, garlic, lavender, marjoram, mint, oregano, parsley, pepper, rosemary, sage, savory, sumac, tarragon, thyme   It's not just a diet, it's a lifestyle:  . The Mediterranean diet includes lifestyle factors typical of those in the region  . Foods, drinks and meals are best eaten with others and savored . Daily physical activity is important for overall good health . This could be strenuous exercise like running and aerobics . This could also be more leisurely activities such as walking, housework, yard-work, or taking the stairs . Moderation is the key; a balanced and healthy diet accommodates most foods and drinks . Consider portion sizes and frequency of consumption of certain foods   Meal Ideas & Options:  . Breakfast:  o Whole wheat toast or whole wheat English muffins with peanut butter & hard boiled egg o Steel cut oats topped with apples & cinnamon and skim milk  o Fresh fruit: banana, strawberries, melon, berries, peaches  o Smoothies: strawberries, bananas, greek yogurt, peanut butter o Low fat greek yogurt with  blueberries and granola  o Egg white omelet with spinach and mushrooms o Breakfast couscous: whole wheat couscous, apricots, skim milk, cranberries  . Sandwiches:  o Hummus and grilled vegetables (peppers, zucchini, squash) on whole wheat bread   o Grilled chicken on whole wheat pita with lettuce, tomatoes, cucumbers or tzatziki  o Tuna salad on whole wheat bread: tuna salad made with greek yogurt, olives, red peppers, capers, green onions o Garlic rosemary lamb pita: lamb sauted with garlic, rosemary, salt & pepper; add lettuce, cucumber, greek yogurt to pita - flavor with lemon juice and black pepper  . Seafood:  o Mediterranean grilled salmon, seasoned with garlic, basil, parsley, lemon juice and black pepper o Shrimp, lemon, and spinach whole-grain pasta salad made with low fat greek yogurt  o Seared scallops with lemon orzo  o Seared tuna steaks seasoned salt, pepper, coriander topped with tomato mixture of olives, tomatoes, olive oil, minced garlic, parsley, green onions and cappers  . Meats:  o Herbed greek chicken salad with kalamata olives, cucumber, feta  o Red bell peppers stuffed with spinach, bulgur, lean ground  beef (or lentils) & topped with feta   o Kebabs: skewers of chicken, tomatoes, onions, zucchini, squash  o [place] burgers: made with red onions, mint, dill, lemon juice, feta cheese topped with roasted red peppers . Vegetarian o Cucumber salad: cucumbers, artichoke hearts, celery, red onion, feta cheese, tossed in olive oil & lemon juice  o Hummus and whole grain pita points with a greek salad (lettuce, tomato, feta, olives, cucumbers, red onion) o Lentil soup with celery, carrots made with vegetable broth, garlic, salt and pepper  o Tabouli salad: parsley, bulgur, mint, scallions, cucumbers, tomato, radishes, lemon juice, olive oil, salt and pepper.    Preventive Care for Adults, Female  A healthy lifestyle and preventive care can promote health and wellness.  Preventive health guidelines for women include the following key practices.   A routine yearly physical is a good way to check with your health care provider about your health and preventive screening. It is a chance to share any concerns and updates on your health and to receive a thorough exam.   Visit your dentist for a routine exam and preventive care every 6 months. Brush your teeth twice a day and floss once a day. Good oral hygiene prevents tooth decay and gum disease.   The frequency of eye exams is based on your age, health, family medical history, use of contact lenses, and other factors. Follow your health care provider's recommendations for frequency of eye exams.   Eat a healthy diet. Foods like vegetables, fruits, whole grains, low-fat dairy products, and lean protein foods contain the nutrients you need without too many calories. Decrease your intake of foods high in solid fats, added sugars, and salt. Eat the right amount of calories for you.Get information about a proper diet from your health care provider, if necessary.   Regular physical exercise is one of the most important things you can do for your health. Most adults should get at least 150 minutes of moderate-intensity exercise (any activity that increases your heart rate and causes you to sweat) each week. In addition, most adults need muscle-strengthening exercises on 2 or more days a week.   Maintain a healthy weight. The body mass index (BMI) is a screening tool to identify possible weight problems. It provides an estimate of body fat based on height and weight. Your health care provider can find your BMI, and can help you achieve or maintain a healthy weight.For adults 20 years and older:   - A BMI below 18.5 is considered underweight.   - A BMI of 18.5 to 24.9 is normal.   - A BMI of 25 to 29.9 is considered overweight.   - A BMI of 30 and above is considered obese.   Maintain normal blood lipids and  cholesterol levels by exercising and minimizing your intake of trans and saturated fats.  Eat a balanced diet with plenty of fruit and vegetables. Blood tests for lipids and cholesterol should begin at age 52 and be repeated every 5 years minimum.  If your lipid or cholesterol levels are high, you are over 40, or you are at high risk for heart disease, you may need your cholesterol levels checked more frequently.Ongoing high lipid and cholesterol levels should be treated with medicines if diet and exercise are not working.   If you smoke, find out from your health care provider how to quit. If you do not use tobacco, do not start.   Lung cancer screening is recommended for adults aged  55-80 years who are at high risk for developing lung cancer because of a history of smoking. A yearly low-dose CT scan of the lungs is recommended for people who have at least a 30-pack-year history of smoking and are a current smoker or have quit within the past 15 years. A pack year of smoking is smoking an average of 1 pack of cigarettes a day for 1 year (for example: 1 pack a day for 30 years or 2 packs a day for 15 years). Yearly screening should continue until the smoker has stopped smoking for at least 15 years. Yearly screening should be stopped for people who develop a health problem that would prevent them from having lung cancer treatment.   If you are pregnant, do not drink alcohol. If you are breastfeeding, be very cautious about drinking alcohol. If you are not pregnant and choose to drink alcohol, do not have more than 1 drink per day. One drink is considered to be 12 ounces (355 mL) of beer, 5 ounces (148 mL) of wine, or 1.5 ounces (44 mL) of liquor.   Avoid use of street drugs. Do not share needles with anyone. Ask for help if you need support or instructions about stopping the use of drugs.   High blood pressure causes heart disease and increases the risk of stroke. Your blood pressure should be  checked at least yearly.  Ongoing high blood pressure should be treated with medicines if weight loss and exercise do not work.   If you are 70-59 years old, ask your health care provider if you should take aspirin to prevent strokes.   Diabetes screening involves taking a blood sample to check your fasting blood sugar level. This should be done once every 3 years, after age 59, if you are within normal weight and without risk factors for diabetes. Testing should be considered at a younger age or be carried out more frequently if you are overweight and have at least 1 risk factor for diabetes.   Breast cancer screening is essential preventive care for women. You should practice "breast self-awareness."  This means understanding the normal appearance and feel of your breasts and may include breast self-examination.  Any changes detected, no matter how small, should be reported to a health care provider.  Women in their 43s and 30s should have a clinical breast exam (CBE) by a health care provider as part of a regular health exam every 1 to 3 years.  After age 107, women should have a CBE every year.  Starting at age 87, women should consider having a mammogram (breast X-ray test) every year.  Women who have a family history of breast cancer should talk to their health care provider about genetic screening.  Women at a high risk of breast cancer should talk to their health care providers about having an MRI and a mammogram every year.   -Breast cancer gene (BRCA)-related cancer risk assessment is recommended for women who have family members with BRCA-related cancers. BRCA-related cancers include breast, ovarian, tubal, and peritoneal cancers. Having family members with these cancers may be associated with an increased risk for harmful changes (mutations) in the breast cancer genes BRCA1 and BRCA2. Results of the assessment will determine the need for genetic counseling and BRCA1 and BRCA2 testing.   The  Pap test is a screening test for cervical cancer. A Pap test can show cell changes on the cervix that might become cervical cancer if left untreated. A Pap test  is a procedure in which cells are obtained and examined from the lower end of the uterus (cervix).   - Women should have a Pap test starting at age 40.   - Between ages 56 and 58, Pap tests should be repeated every 2 years.   - Beginning at age 21, you should have a Pap test every 3 years as long as the past 3 Pap tests have been normal.   - Some women have medical problems that increase the chance of getting cervical cancer. Talk to your health care provider about these problems. It is especially important to talk to your health care provider if a new problem develops soon after your last Pap test. In these cases, your health care provider may recommend more frequent screening and Pap tests.   - The above recommendations are the same for women who have or have not gotten the vaccine for human papillomavirus (HPV).   - If you had a hysterectomy for a problem that was not cancer or a condition that could lead to cancer, then you no longer need Pap tests. Even if you no longer need a Pap test, a regular exam is a good idea to make sure no other problems are starting.   - If you are between ages 19 and 51 years, and you have had normal Pap tests going back 10 years, you no longer need Pap tests. Even if you no longer need a Pap test, a regular exam is a good idea to make sure no other problems are starting.   - If you have had past treatment for cervical cancer or a condition that could lead to cancer, you need Pap tests and screening for cancer for at least 20 years after your treatment.   - If Pap tests have been discontinued, risk factors (such as a new sexual partner) need to be reassessed to determine if screening should be resumed.   - The HPV test is an additional test that may be used for cervical cancer screening. The HPV test looks  for the virus that can cause the cell changes on the cervix. The cells collected during the Pap test can be tested for HPV. The HPV test could be used to screen women aged 13 years and older, and should be used in women of any age who have unclear Pap test results. After the age of 40, women should have HPV testing at the same frequency as a Pap test.   Colorectal cancer can be detected and often prevented. Most routine colorectal cancer screening begins at the age of 71 years and continues through age 22 years. However, your health care provider may recommend screening at an earlier age if you have risk factors for colon cancer. On a yearly basis, your health care provider may provide home test kits to check for hidden blood in the stool.  Use of a small camera at the end of a tube, to directly examine the colon (sigmoidoscopy or colonoscopy), can detect the earliest forms of colorectal cancer. Talk to your health care provider about this at age 47, when routine screening begins. Direct exam of the colon should be repeated every 5 -10 years through age 3 years, unless early forms of pre-cancerous polyps or small growths are found.   People who are at an increased risk for hepatitis B should be screened for this virus. You are considered at high risk for hepatitis B if:  -You were born in a country where hepatitis  B occurs often. Talk with your health care provider about which countries are considered high risk.  - Your parents were born in a high-risk country and you have not received a shot to protect against hepatitis B (hepatitis B vaccine).  - You have HIV or AIDS.  - You use needles to inject street drugs.  - You live with, or have sex with, someone who has Hepatitis B.  - You get hemodialysis treatment.  - You take certain medicines for conditions like cancer, organ transplantation, and autoimmune conditions.   Hepatitis C blood testing is recommended for all people born from [birth year] through  1965 and any individual with known risks for hepatitis C.   Practice safe sex. Use condoms and avoid high-risk sexual practices to reduce the spread of sexually transmitted infections (STIs). STIs include gonorrhea, chlamydia, syphilis, trichomonas, herpes, HPV, and human immunodeficiency virus (HIV). Herpes, HIV, and HPV are viral illnesses that have no cure. They can result in disability, cancer, and death. Sexually active women aged 72 years and younger should be checked for chlamydia. Older women with new or multiple partners should also be tested for chlamydia. Testing for other STIs is recommended if you are sexually active and at increased risk.   Osteoporosis is a disease in which the bones lose minerals and strength with aging. This can result in serious bone fractures or breaks. The risk of osteoporosis can be identified using a bone density scan. Women ages 43 years and over and women at risk for fractures or osteoporosis should discuss screening with their health care providers. Ask your health care provider whether you should take a calcium supplement or vitamin D to There are also several preventive steps women can take to avoid osteoporosis and resulting fractures or to keep osteoporosis from worsening. -->Recommendations include:  Eat a balanced diet high in fruits, vegetables, calcium, and vitamins.  Get enough calcium. The recommended total intake of is 1,200 mg daily; for best absorption, if taking supplements, divide doses into 250-500 mg doses throughout the day. Of the two types of calcium, calcium carbonate is best absorbed when taken with food but calcium citrate can be taken on an empty stomach.  Get enough vitamin D. NAMS and the [employer] recommend at least 1,000 IU per day for women age 51 and over who are at risk of vitamin D deficiency. Vitamin D deficiency can be caused by inadequate sun exposure (for example, those who live in [place]).  Avoid alcohol and smoking. Heavy alcohol intake (more than 7 drinks per week) increases the risk of falls and hip fracture and women smokers tend to lose bone more rapidly and have lower bone mass than nonsmokers. Stopping smoking is one of the most important changes women can make to improve their health and decrease risk for disease.  Be physically active every day. Weight-bearing exercise (for example, fast walking, hiking, jogging, and weight training) may strengthen bones or slow the rate of bone loss that comes with aging. Balancing and muscle-strengthening exercises can reduce the risk of falling and fracture.  Consider therapeutic medications. Currently, several types of effective drugs are available. Healthcare providers can recommend the type most appropriate for each woman.  Eliminate environmental factors that may contribute to accidents. Falls cause nearly 90% of all osteoporotic fractures, so reducing this risk is an important bone-health strategy. Measures include ample lighting, removing obstructions to walking, using nonskid rugs on floors, and placing mats and/or grab bars in showers.  Be  aware of medication side effects. Some common medicines make bones weaker. These include a type of steroid drug called glucocorticoids used for arthritis and asthma, some antiseizure drugs, certain sleeping pills, treatments for endometriosis, and some cancer drugs. An overactive thyroid gland or using too much thyroid hormone for an underactive thyroid can also be a problem. If you are taking these medicines, talk to your doctor about what you can do to help protect your bones.reduce the rate of osteoporosis.    Menopause can be associated with physical symptoms and risks. Hormone replacement therapy is available to decrease symptoms and risks. You should talk to your health care provider about whether hormone replacement therapy is right for you.   Use sunscreen. Apply sunscreen  liberally and repeatedly throughout the day. You should seek shade when your shadow is shorter than you. Protect yourself by wearing long sleeves, pants, a wide-brimmed hat, and sunglasses year round, whenever you are outdoors.   Once a month, do a whole body skin exam, using a mirror to look at the skin on your back. Tell your health care provider of new moles, moles that have irregular borders, moles that are larger than a pencil eraser, or moles that have changed in shape or color.   -Stay current with required vaccines (immunizations).   Influenza vaccine. All adults should be immunized every year.  Tetanus, diphtheria, and acellular pertussis (Td, Tdap) vaccine. Pregnant women should receive 1 dose of Tdap vaccine during each pregnancy. The dose should be obtained regardless of the length of time since the last dose. Immunization is preferred during the 27th 36th week of gestation. An adult who has not previously received Tdap or who does not know her vaccine status should receive 1 dose of Tdap. This initial dose should be followed by tetanus and diphtheria toxoids (Td) booster doses every 10 years. Adults with an unknown or incomplete history of completing a 3-dose immunization series with Td-containing vaccines should begin or complete a primary immunization series including a Tdap dose. Adults should receive a Td booster every 10 years.  Varicella vaccine. An adult without evidence of immunity to varicella should receive 2 doses or a second dose if she has previously received 1 dose. Pregnant females who do not have evidence of immunity should receive the first dose after pregnancy. This first dose should be obtained before leaving the health care facility. The second dose should be obtained 4 8 weeks after the first dose.  Human papillomavirus (HPV) vaccine. Females aged 28 26 years who have not received the vaccine previously should obtain the 3-dose series. The vaccine is not recommended  for use in pregnant females. However, pregnancy testing is not needed before receiving a dose. If a female is found to be pregnant after receiving a dose, no treatment is needed. In that case, the remaining doses should be delayed until after the pregnancy. Immunization is recommended for any person with an immunocompromised condition through the age of 78 years if she did not get any or all doses earlier. During the 3-dose series, the second dose should be obtained 4 8 weeks after the first dose. The third dose should be obtained 24 weeks after the first dose and 16 weeks after the second dose.  Zoster vaccine. One dose is recommended for adults aged 105 years or older unless certain conditions are present.  Measles, mumps, and rubella (MMR) vaccine. Adults born before [birth year] generally are considered immune to measles and mumps. Adults born in [birth year] or later  should have 1 or more doses of MMR vaccine unless there is a contraindication to the vaccine or there is laboratory evidence of immunity to each of the three diseases. A routine second dose of MMR vaccine should be obtained at least 28 days after the first dose for students attending postsecondary schools, health care workers, or international travelers. People who received inactivated measles vaccine or an unknown type of measles vaccine during 1963 1967 should receive 2 doses of MMR vaccine. People who received inactivated mumps vaccine or an unknown type of mumps vaccine before 1979 and are at high risk for mumps infection should consider immunization with 2 doses of MMR vaccine. For females of childbearing age, rubella immunity should be determined. If there is no evidence of immunity, females who are not pregnant should be vaccinated. If there is no evidence of immunity, females who are pregnant should delay immunization until after pregnancy. Unvaccinated health care workers born before [birth year] who lack laboratory evidence of measles, mumps, or rubella  immunity or laboratory confirmation of disease should consider measles and mumps immunization with 2 doses of MMR vaccine or rubella immunization with 1 dose of MMR vaccine.  Pneumococcal 13-valent conjugate (PCV13) vaccine. When indicated, a person who is uncertain of her immunization history and has no record of immunization should receive the PCV13 vaccine. An adult aged 47 years or older who has certain medical conditions and has not been previously immunized should receive 1 dose of PCV13 vaccine. This PCV13 should be followed with a dose of pneumococcal polysaccharide (PPSV23) vaccine. The PPSV23 vaccine dose should be obtained at least 8 weeks after the dose of PCV13 vaccine. An adult aged 9 years or older who has certain medical conditions and previously received 1 or more doses of PPSV23 vaccine should receive 1 dose of PCV13. The PCV13 vaccine dose should be obtained 1 or more years after the last PPSV23 vaccine dose.  Pneumococcal polysaccharide (PPSV23) vaccine. When PCV13 is also indicated, PCV13 should be obtained first. All adults aged 17 years and older should be immunized. An adult younger than age 56 years who has certain medical conditions should be immunized. Any person who resides in a nursing home or long-term care facility should be immunized. An adult smoker should be immunized. People with an immunocompromised condition and certain other conditions should receive both PCV13 and PPSV23 vaccines. People with human immunodeficiency virus (HIV) infection should be immunized as soon as possible after diagnosis. Immunization during chemotherapy or radiation therapy should be avoided. Routine use of PPSV23 vaccine is not recommended for American Indians, [place] Natives, or people younger than 65 years unless there are medical conditions that require PPSV23 vaccine. When indicated, people who have unknown immunization and have no record of immunization should receive PPSV23 vaccine. One-time  revaccination 5 years after the first dose of PPSV23 is recommended for people aged 75 64 years who have chronic kidney failure, nephrotic syndrome, asplenia, or immunocompromised conditions. People who received 1 2 doses of PPSV23 before age 2 years should receive another dose of PPSV23 vaccine at age 53 years or later if at least 5 years have passed since the previous dose. Doses of PPSV23 are not needed for people immunized with PPSV23 at or after age 34 years.  Meningococcal vaccine. Adults with asplenia or persistent complement component deficiencies should receive 2 doses of quadrivalent meningococcal conjugate (MenACWY-D) vaccine. The doses should be obtained at least 2 months apart. Microbiologists working with certain meningococcal bacteria, [employer] recruits, people at risk  during an outbreak, and people who travel to or live in countries with a high rate of meningitis should be immunized. A first-year college student up through age 27 years who is living in a residence hall should receive a dose if she did not receive a dose on or after her 16th birthday. Adults who have certain high-risk conditions should receive one or more doses of vaccine.  Hepatitis A vaccine. Adults who wish to be protected from this disease, have certain high-risk conditions, work with hepatitis A-infected animals, work in hepatitis A research labs, or travel to or work in countries with a high rate of hepatitis A should be immunized. Adults who were previously unvaccinated and who anticipate close contact with an international adoptee during the first 60 days after arrival in the [place] from a country with a high rate of hepatitis A should be immunized.  Hepatitis B vaccine.  Adults who wish to be protected from this disease, have certain high-risk conditions, may be exposed to blood or other infectious body fluids, are household contacts or sex partners of hepatitis B positive people, are clients or workers in  certain care facilities, or travel to or work in countries with a high rate of hepatitis B should be immunized.  Haemophilus influenzae type b (Hib) vaccine. A previously unvaccinated person with asplenia or sickle cell disease or having a scheduled splenectomy should receive 1 dose of Hib vaccine. Regardless of previous immunization, a recipient of a hematopoietic stem cell transplant should receive a 3-dose series 6 12 months after her successful transplant. Hib vaccine is not recommended for adults with HIV infection.  Preventive Services / Frequency Ages 12 to 39years  Blood pressure check.** / Every 1 to 2 years.  Lipid and cholesterol check.** / Every 5 years beginning at age 5.  Clinical breast exam.** / Every 3 years for women in their 35s and 53s.  BRCA-related cancer risk assessment.** / For women who have family members with a BRCA-related cancer (breast, ovarian, tubal, or peritoneal cancers).  Pap test.** / Every 2 years from ages 45 through 82. Every 3 years starting at age 30 through age 22 or 19 with a history of 3 consecutive normal Pap tests.  HPV screening.** / Every 3 years from ages 71 through ages 25 to 72 with a history of 3 consecutive normal Pap tests.  Hepatitis C blood test.** / For any individual with known risks for hepatitis C.  Skin self-exam. / Monthly.  Influenza vaccine. / Every year.  Tetanus, diphtheria, and acellular pertussis (Tdap, Td) vaccine.** / Consult your health care provider. Pregnant women should receive 1 dose of Tdap vaccine during each pregnancy. 1 dose of Td every 10 years.  Varicella vaccine.** / Consult your health care provider. Pregnant females who do not have evidence of immunity should receive the first dose after pregnancy.  HPV vaccine. / 3 doses over 6 months, if 27 and younger. The vaccine is not recommended for use in pregnant females. However, pregnancy testing is not needed before receiving a dose.  Measles, mumps,  rubella (MMR) vaccine.** / You need at least 1 dose of MMR if you were born in 1957 or later. You may also need a 2nd dose. For females of childbearing age, rubella immunity should be determined. If there is no evidence of immunity, females who are not pregnant should be vaccinated. If there is no evidence of immunity, females who are pregnant should delay immunization until after pregnancy.  Pneumococcal 13-valent conjugate (  PCV13) vaccine.** / Consult your health care provider.  Pneumococcal polysaccharide (PPSV23) vaccine.** / 1 to 2 doses if you smoke cigarettes or if you have certain conditions.  Meningococcal vaccine.** / 1 dose if you are age 3 to 45 years and a Market researcher living in a residence hall, or have one of several medical conditions, you need to get vaccinated against meningococcal disease. You may also need additional booster doses.  Hepatitis A vaccine.** / Consult your health care provider.  Hepatitis B vaccine.** / Consult your health care provider.  Haemophilus influenzae type b (Hib) vaccine.** / Consult your health care provider.  Ages 82 to 64years  Blood pressure check.** / Every 1 to 2 years.  Lipid and cholesterol check.** / Every 5 years beginning at age 88 years.  Lung cancer screening. / Every year if you are aged 26 80 years and have a 30-pack-year history of smoking and currently smoke or have quit within the past 15 years. Yearly screening is stopped once you have quit smoking for at least 15 years or develop a health problem that would prevent you from having lung cancer treatment.  Clinical breast exam.** / Every year after age 71 years.  BRCA-related cancer risk assessment.** / For women who have family members with a BRCA-related cancer (breast, ovarian, tubal, or peritoneal cancers).  Mammogram.** / Every year beginning at age 66 years and continuing for as long as you are in good health. Consult with your health care provider.  Pap  test.** / Every 3 years starting at age 30 years through age 20 or 27 years with a history of 3 consecutive normal Pap tests.  HPV screening.** / Every 3 years from ages 29 years through ages 11 to 32 years with a history of 3 consecutive normal Pap tests.  Fecal occult blood test (FOBT) of stool. / Every year beginning at age 25 years and continuing until age 29 years. You may not need to do this test if you get a colonoscopy every 10 years.  Flexible sigmoidoscopy or colonoscopy.** / Every 5 years for a flexible sigmoidoscopy or every 10 years for a colonoscopy beginning at age 10 years and continuing until age 90 years.  Hepatitis C blood test.** / For all people born from [birth year] through 1963 and any individual with known risks for hepatitis C.  Skin self-exam. / Monthly.  Influenza vaccine. / Every year.  Tetanus, diphtheria, and acellular pertussis (Tdap/Td) vaccine.** / Consult your health care provider. Pregnant women should receive 1 dose of Tdap vaccine during each pregnancy. 1 dose of Td every 10 years.  Varicella vaccine.** / Consult your health care provider. Pregnant females who do not have evidence of immunity should receive the first dose after pregnancy.  Zoster vaccine.** / 1 dose for adults aged 64 years or older.  Measles, mumps, rubella (MMR) vaccine.** / You need at least 1 dose of MMR if you were born in 1957 or later. You may also need a 2nd dose. For females of childbearing age, rubella immunity should be determined. If there is no evidence of immunity, females who are not pregnant should be vaccinated. If there is no evidence of immunity, females who are pregnant should delay immunization until after pregnancy.  Pneumococcal 13-valent conjugate (PCV13) vaccine.** / Consult your health care provider.  Pneumococcal polysaccharide (PPSV23) vaccine.** / 1 to 2 doses if you smoke cigarettes or if you have certain conditions.  Meningococcal vaccine.** / Consult your health  care provider.  Hepatitis  A vaccine.** / Consult your health care provider.  Hepatitis B vaccine.** / Consult your health care provider.  Haemophilus influenzae type b (Hib) vaccine.** / Consult your health care provider.  Ages 75 years and over  Blood pressure check.** / Every 1 to 2 years.  Lipid and cholesterol check.** / Every 5 years beginning at age 68 years.  Lung cancer screening. / Every year if you are aged 42 80 years and have a 30-pack-year history of smoking and currently smoke or have quit within the past 15 years. Yearly screening is stopped once you have quit smoking for at least 15 years or develop a health problem that would prevent you from having lung cancer treatment.  Clinical breast exam.** / Every year after age 68 years.  BRCA-related cancer risk assessment.** / For women who have family members with a BRCA-related cancer (breast, ovarian, tubal, or peritoneal cancers).  Mammogram.** / Every year beginning at age 84 years and continuing for as long as you are in good health. Consult with your health care provider.  Pap test.** / Every 3 years starting at age 58 years through age 58 or 56 years with 3 consecutive normal Pap tests. Testing can be stopped between 65 and 70 years with 3 consecutive normal Pap tests and no abnormal Pap or HPV tests in the past 10 years.  HPV screening.** / Every 3 years from ages 8 years through ages 32 or 37 years with a history of 3 consecutive normal Pap tests. Testing can be stopped between 65 and 70 years with 3 consecutive normal Pap tests and no abnormal Pap or HPV tests in the past 10 years.  Fecal occult blood test (FOBT) of stool. / Every year beginning at age 67 years and continuing until age 96 years. You may not need to do this test if you get a colonoscopy every 10 years.  Flexible sigmoidoscopy or colonoscopy.** / Every 5 years for a flexible sigmoidoscopy or every 10 years for a colonoscopy beginning at age 63 years  and continuing until age 15 years.  Hepatitis C blood test.** / For all people born from [birth year] through 1963 and any individual with known risks for hepatitis C.  Osteoporosis screening.** / A one-time screening for women ages 41 years and over and women at risk for fractures or osteoporosis.  Skin self-exam. / Monthly.  Influenza vaccine. / Every year.  Tetanus, diphtheria, and acellular pertussis (Tdap/Td) vaccine.** / 1 dose of Td every 10 years.  Varicella vaccine.** / Consult your health care provider.  Zoster vaccine.** / 1 dose for adults aged 44 years or older.  Pneumococcal 13-valent conjugate (PCV13) vaccine.** / Consult your health care provider.  Pneumococcal polysaccharide (PPSV23) vaccine.** / 1 dose for all adults aged 47 years and older.  Meningococcal vaccine.** / Consult your health care provider.  Hepatitis A vaccine.** / Consult your health care provider.  Hepatitis B vaccine.** / Consult your health care provider.  Haemophilus influenzae type b (Hib) vaccine.** / Consult your health care provider. ** Family history and personal history of risk and conditions may change your health care provider's recommendations. Document Released: 03/11/2001 Document Revised: 11/03/2012  [facility] Patient Information 2014 [place], [place].   EXERCISE AND DIET:  We recommended that you start or continue a regular exercise program for good health. Regular exercise means any activity that makes your heart beat faster and makes you sweat.  We recommend exercising at least 30 minutes per day at least 3 days a week,  preferably 5.  We also recommend a diet low in fat and sugar / carbohydrates.  Inactivity, poor dietary choices and obesity can cause diabetes, heart attack, stroke, and kidney damage, among others.     ALCOHOL AND SMOKING:  Women should limit their alcohol intake to no more than 7 drinks/beers/glasses of wine (combined, not each!) per week. Moderation of alcohol intake to  this level decreases your risk of breast cancer and liver damage.  ( And of course, no recreational drugs are part of a healthy lifestyle.)  Also, you should not be smoking at all or even being exposed to second hand smoke. Most people know smoking can cause cancer, and various heart and lung diseases, but did you know it also contributes to weakening of your bones?  Aging of your skin?  Yellowing of your teeth and nails?   CALCIUM AND VITAMIN D:  Adequate intake of calcium and Vitamin D are recommended.  The recommendations for exact amounts of these supplements seem to change often, but generally speaking 600 mg of calcium (either carbonate or citrate) and 800 units of Vitamin D per day seems prudent. Certain women may benefit from higher intake of Vitamin D.  If you are among these women, your doctor will have told you during your visit.     PAP SMEARS:  Pap smears, to check for cervical cancer or precancers,  have traditionally been done yearly, although recent scientific advances have shown that most women can have pap smears less often.  However, every woman still should have a physical exam from her gynecologist or primary care physician every year. It will include a breast check, inspection of the vulva and vagina to check for abnormal growths or skin changes, a visual exam of the cervix, and then an exam to evaluate the size and shape of the uterus and ovaries.  And after 35 years of age, a rectal exam is indicated to check for rectal cancers. We will also provide age appropriate advice regarding health maintenance, like when you should have certain vaccines, screening for sexually transmitted diseases, bone density testing, colonoscopy, mammograms, etc.    MAMMOGRAMS:  All women over 50 years old should have a yearly mammogram. Many facilities now offer a "3D" mammogram, which may cost around $50 extra out of pocket. If possible,  we recommend you accept the option to have the 3D mammogram  performed.  It both reduces the number of women who will be called back for extra views which then turn out to be normal, and it is better than the routine mammogram at detecting truly abnormal areas.     COLONOSCOPY:  Colonoscopy to screen for colon cancer is recommended for all women at age 92.  We know, you hate the idea of the prep.  We agree, BUT, having colon cancer and not knowing it is worse!!  Colon cancer so often starts as a polyp that can be seen and removed at colonscopy, which can quite literally save your life!  And if your first colonoscopy is normal and you have no family history of colon cancer, most women don't have to have it again for 10 years.  Once every ten years, you can do something that may end up saving your life, right?  We will be happy to help you get it scheduled when you are ready.  Be sure to check your insurance coverage so you understand how much it will cost.  It may be covered as a preventative service at  no cost, but you should check your particular policy.

## 2017-11-21 LAB — HEMOGLOBIN A1C
Est. average glucose Bld gHb Est-mCnc: 103 mg/dL
HEMOGLOBIN A1C: 5.2 % (ref 4.8–5.6)

## 2017-11-21 LAB — CBC WITH DIFFERENTIAL/PLATELET
BASOS ABS: 0 10*3/uL (ref 0.0–0.2)
Basos: 1 %
EOS (ABSOLUTE): 0.1 10*3/uL (ref 0.0–0.4)
Eos: 3 %
HEMOGLOBIN: 13.3 g/dL (ref 11.1–15.9)
Hematocrit: 39.4 % (ref 34.0–46.6)
Immature Grans (Abs): 0 10*3/uL (ref 0.0–0.1)
Immature Granulocytes: 0 %
LYMPHS ABS: 1.7 10*3/uL (ref 0.7–3.1)
Lymphs: 33 %
MCH: 28.8 pg (ref 26.6–33.0)
MCHC: 33.8 g/dL (ref 31.5–35.7)
MCV: 85 fL (ref 79–97)
MONOCYTES: 10 %
MONOS ABS: 0.5 10*3/uL (ref 0.1–0.9)
Neutrophils Absolute: 2.9 10*3/uL (ref 1.4–7.0)
Neutrophils: 53 %
Platelets: 275 10*3/uL (ref 150–450)
RBC: 4.62 x10E6/uL (ref 3.77–5.28)
RDW: 12 % — ABNORMAL LOW (ref 12.3–15.4)
WBC: 5.3 10*3/uL (ref 3.4–10.8)

## 2017-11-21 LAB — LIPID PANEL
CHOL/HDL RATIO: 2.3 ratio (ref 0.0–4.4)
Cholesterol, Total: 137 mg/dL (ref 100–199)
HDL: 59 mg/dL (ref >39)
LDL Calculated: 67 mg/dL (ref 0–99)
Triglycerides: 53 mg/dL (ref 0–149)
VLDL Cholesterol Cal: 11 mg/dL (ref 5–40)

## 2017-11-21 LAB — COMPREHENSIVE METABOLIC PANEL
ALBUMIN: 4.5 g/dL (ref 3.5–5.5)
ALK PHOS: 54 IU/L (ref 39–117)
ALT: 11 IU/L (ref 0–32)
AST: 13 IU/L (ref 0–40)
Albumin/Globulin Ratio: 2 (ref 1.2–2.2)
BILIRUBIN TOTAL: 0.4 mg/dL (ref 0.0–1.2)
BUN / CREAT RATIO: 16 (ref 9–23)
BUN: 13 mg/dL (ref 6–20)
CO2: 25 mmol/L (ref 20–29)
Calcium: 9.2 mg/dL (ref 8.7–10.2)
Chloride: 103 mmol/L (ref 96–106)
Creatinine, Ser: 0.82 mg/dL (ref 0.57–1.00)
GFR calc Af Amer: 107 mL/min/{1.73_m2} (ref >59)
GFR calc non Af Amer: 93 mL/min/{1.73_m2} (ref >59)
GLUCOSE: 80 mg/dL (ref 65–99)
Globulin, Total: 2.3 g/dL (ref 1.5–4.5)
POTASSIUM: 4.1 mmol/L (ref 3.5–5.2)
SODIUM: 142 mmol/L (ref 134–144)
Total Protein: 6.8 g/dL (ref 6.0–8.5)

## 2017-11-21 LAB — T4, FREE: FREE T4: 1.5 ng/dL (ref 0.82–1.77)

## 2017-11-21 LAB — TSH: TSH: 1.05 u[IU]/mL (ref 0.450–4.500)

## 2017-11-21 LAB — VITAMIN D 25 HYDROXY (VIT D DEFICIENCY, FRACTURES): VIT D 25 HYDROXY: 29.2 ng/mL — AB (ref 30.0–100.0)

## 2017-11-21 IMAGING — CR DG CHEST 2V
2 series · 2 of 2 positions shown · non-contrast
Comparison: None.

CLINICAL DATA: Chest pain for 3 weeks. Shortness of breath. Nausea.

EXAM:
CHEST  2 VIEW

[chest pa]
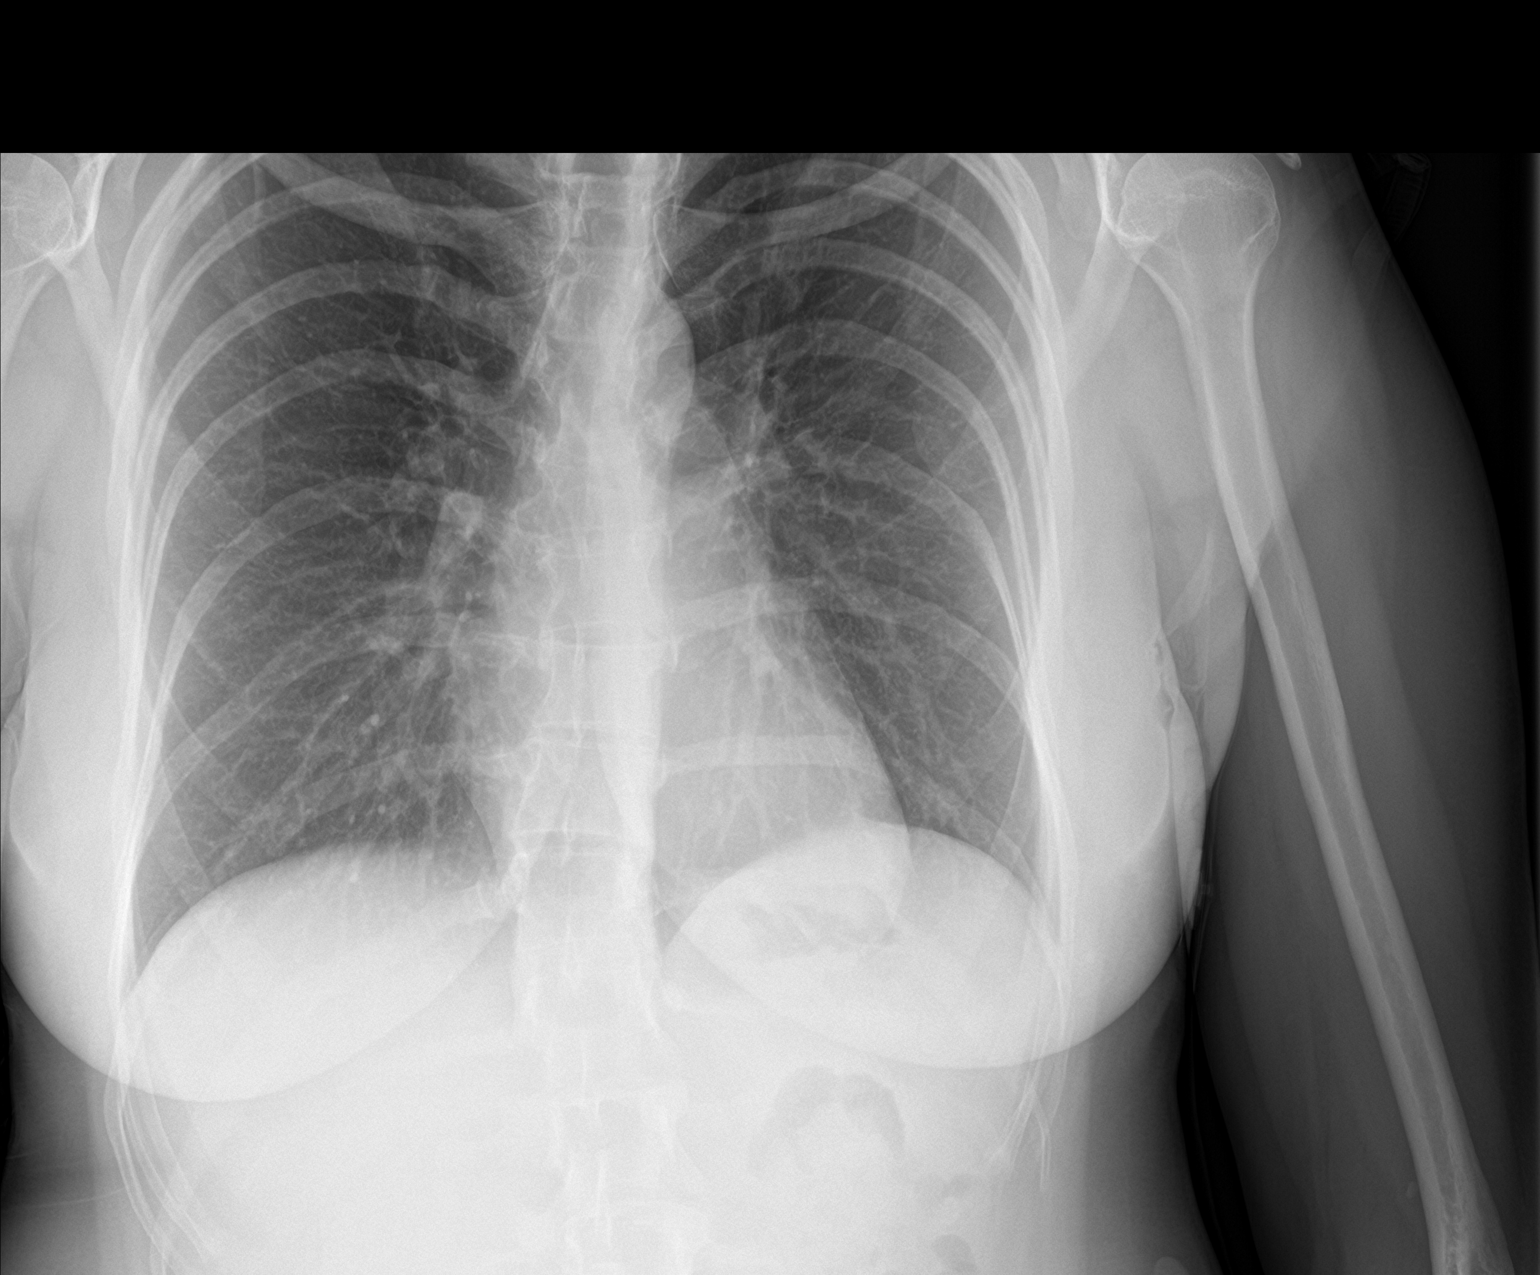

[chest lat]
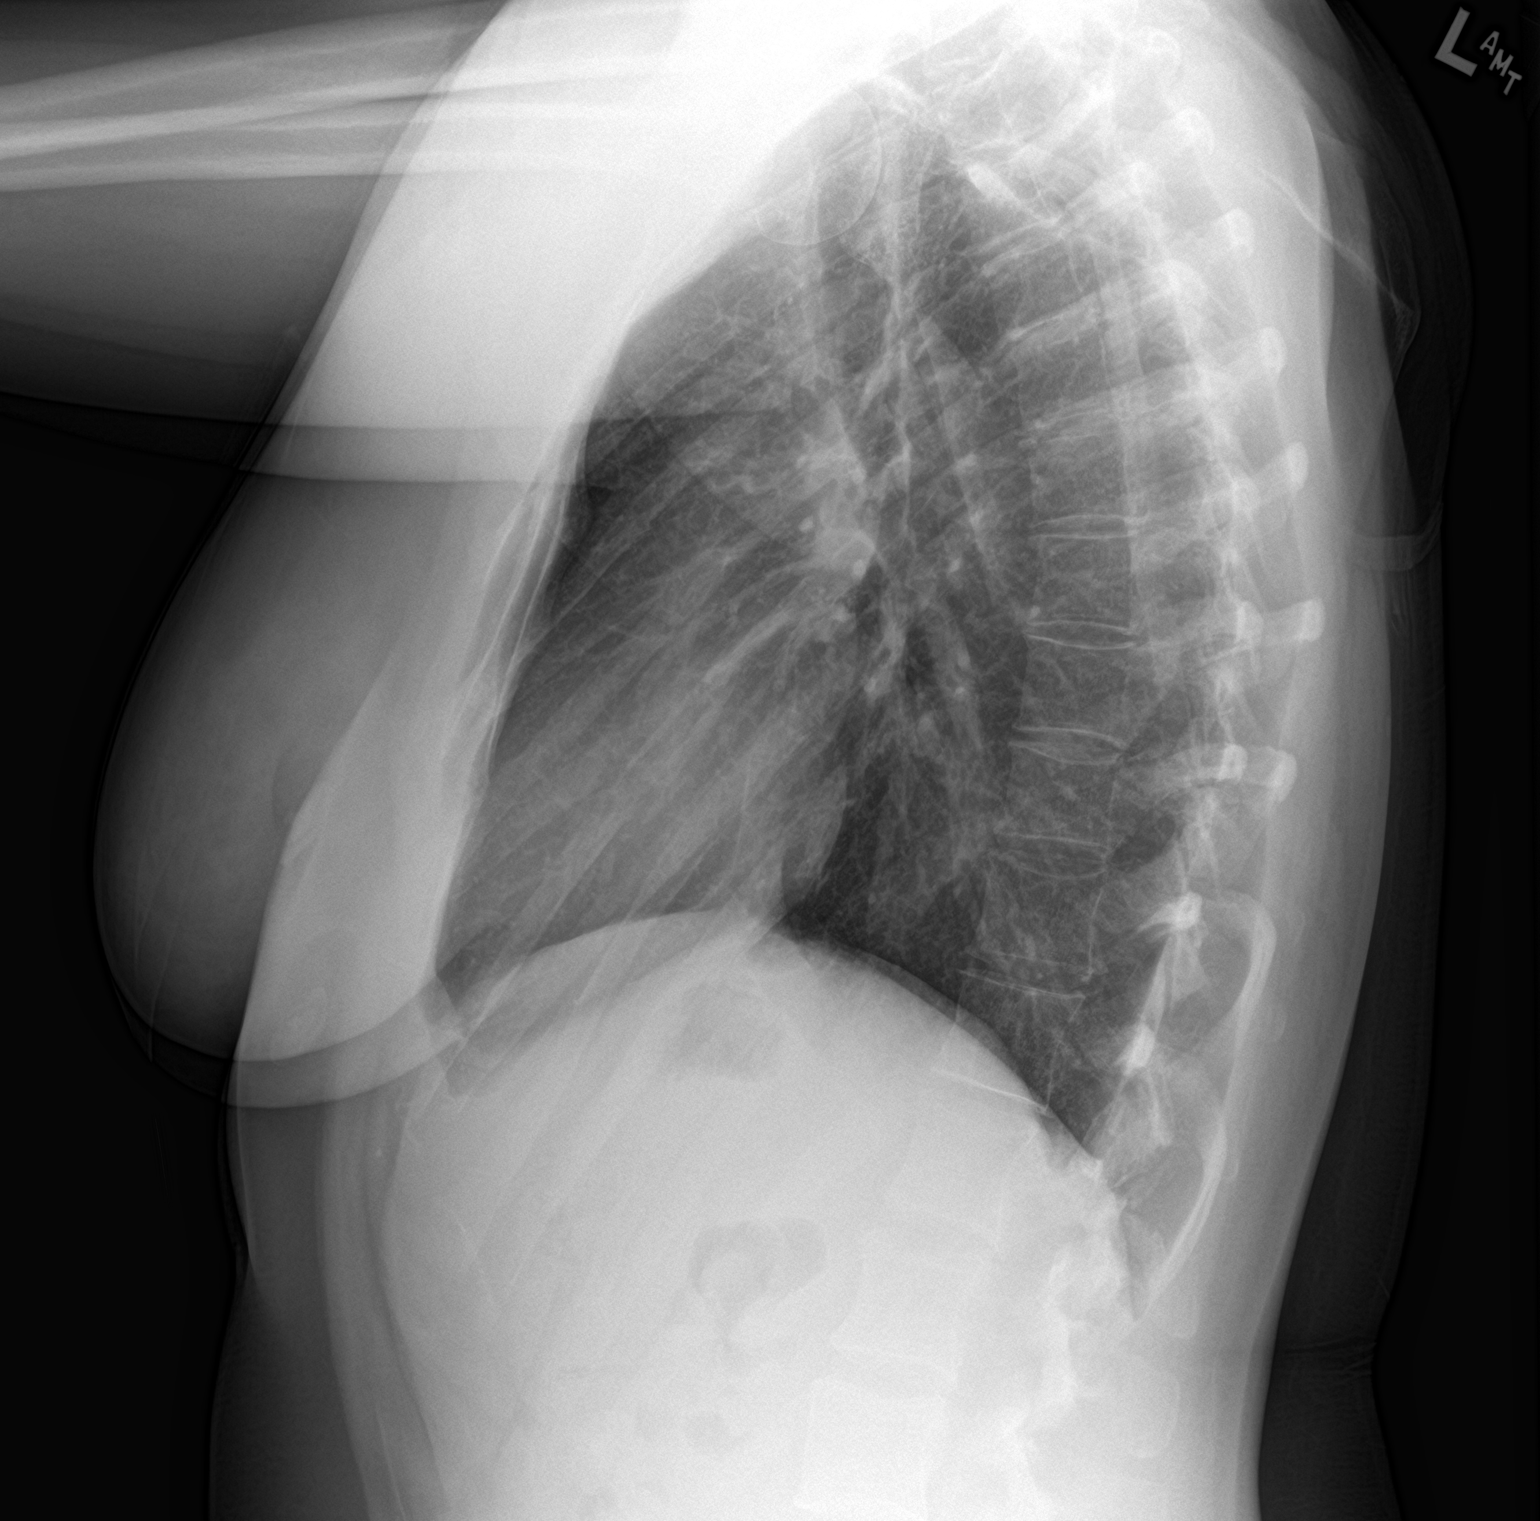

[2 of 2 positions shown; findings below may reference images not displayed]

FINDINGS: The heart size and mediastinal contours are within normal limits.
Both lungs are clear. The visualized skeletal structures are
unremarkable.
IMPRESSION: No active cardiopulmonary disease.

## 2017-12-04 ENCOUNTER — Encounter: Payer: Managed Care, Other (non HMO) | Admitting: Family Medicine

## 2018-01-04 IMAGING — MR MR HEAD WO/W CM
10 series · 48 of 48 positions shown · IV contrast (13 ML MULTIHANCE)
Comparison: None.

CLINICAL DATA: Fatigue.  Weakness

EXAM:
MRI HEAD WITHOUT AND WITH CONTRAST
TECHNIQUE: Multiplanar, multiecho pulse sequences of the brain and surrounding
structures were obtained without and with intravenous contrast.
CONTRAST:  13mL MULTIHANCE GADOBENATE DIMEGLUMINE 529 MG/ML IV SOLN

[Series 3: T1 · sagittal · 5.0mm · 0.45mm/px · 1 of 21 slices shown]
[im 1/21]
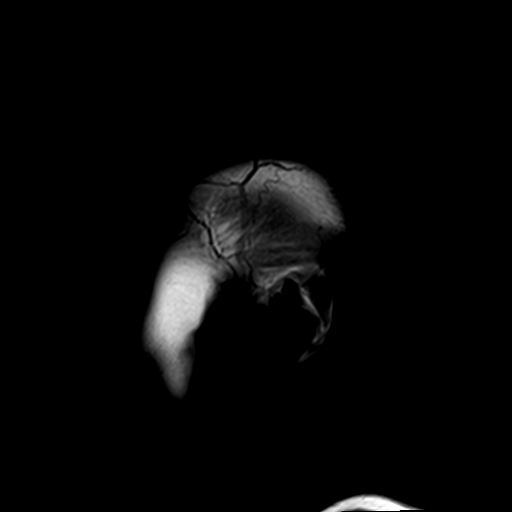

[Series 4: DWI · axial · 3.0mm · 1.80mm/px · z∈[-81,+59]mm · 7 of 95 slices shown (1 of 2)]
[im 1/95]
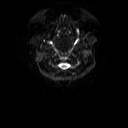
[im 16/95]
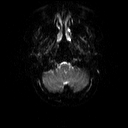
[im 32/95]
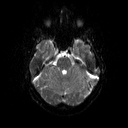
[im 48/95]
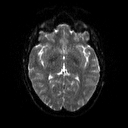
[im 63/95]
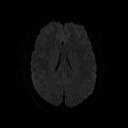
[im 79/95]
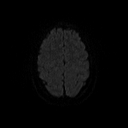
[im 95/95]
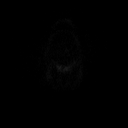

[Series 5: DWI · axial · 3.0mm · 1.80mm/px · z∈[-81,+59]mm · 4 of 48 slices shown (2 of 2)]
[im 1/48]
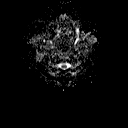
[im 16/48]
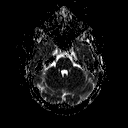
[im 32/48]
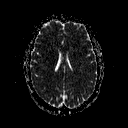
[im 48/48]
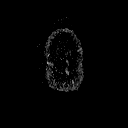

[Series 6: T2 · axial · 5.0mm · 0.60mm/px · z∈[-77,+55]mm · 2 of 20 slices shown (1 of 2)]
[im 1/20]
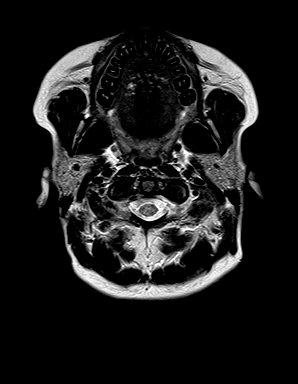
[im 20/20]
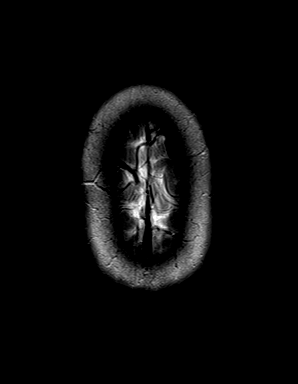

[Series 7: FLAIR · axial · 3.0mm · 0.45mm/px · z∈[-83,+60]mm · 2 of 30 slices shown]
[im 1/30]
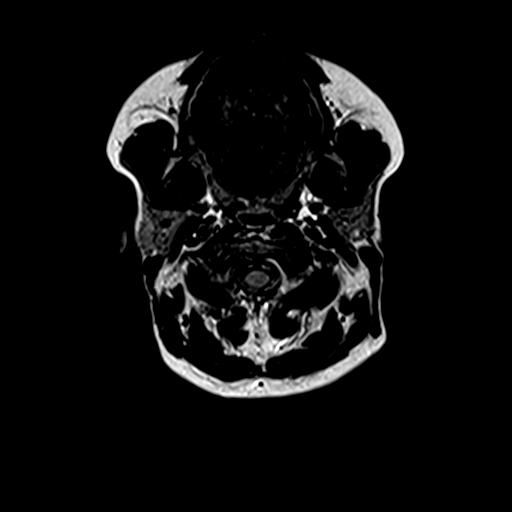
[im 30/30]
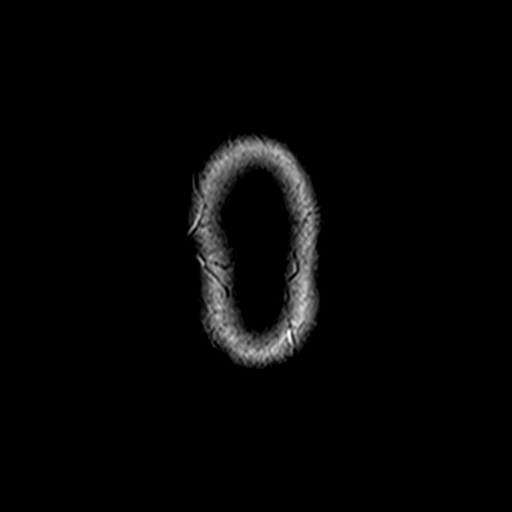

[Series 9: swi_images · axial · 2.0mm · 0.90mm/px · z∈[-82,+60]mm · 6 of 72 slices shown]
[im 1/72]
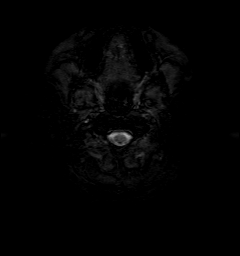
[im 15/72]
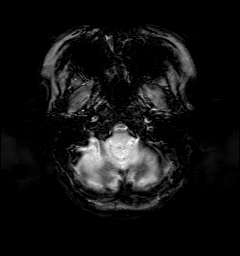
[im 29/72]
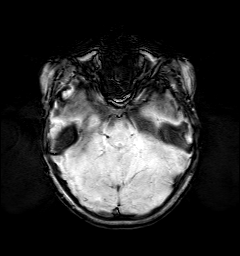
[im 43/72]
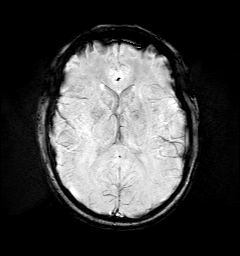
[im 57/72]
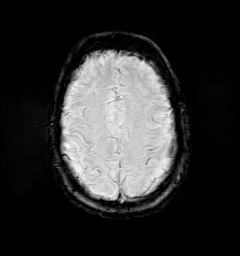
[im 72/72]
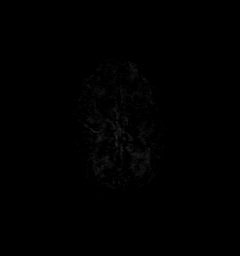

[Series 10: t1_mpr_tra · axial · 1.0mm · 0.72mm/px · z∈[-82,+60]mm · 11 of 144 slices shown (1 of 2)]
[im 1/144]
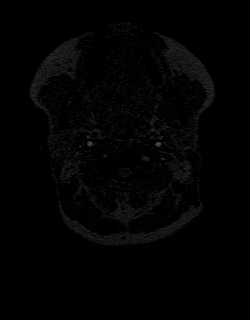
[im 15/144]
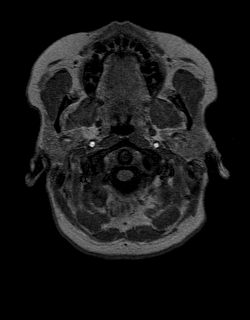
[im 29/144]
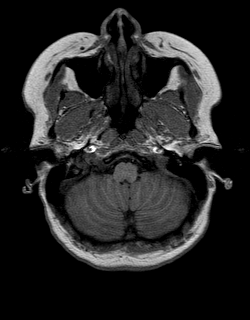
[im 43/144]
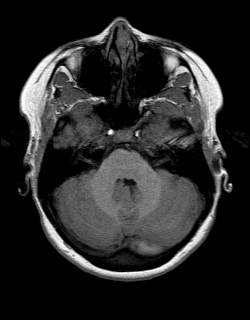
[im 58/144]
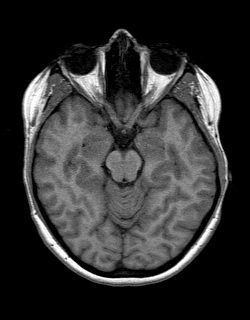
[im 72/144]
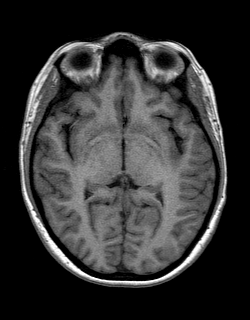
[im 86/144]
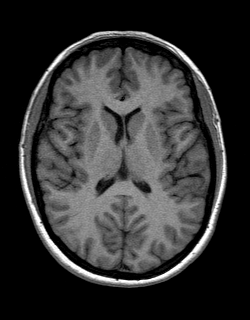
[im 101/144]
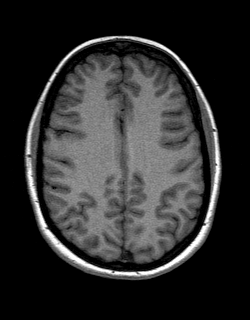
[im 115/144]
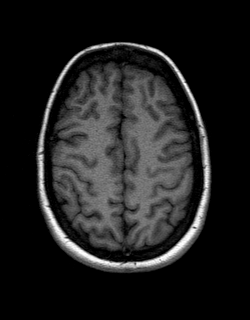
[im 129/144]
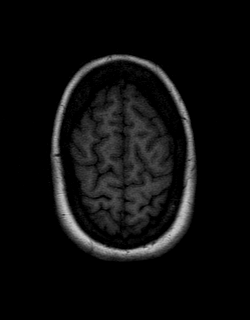
[im 144/144]
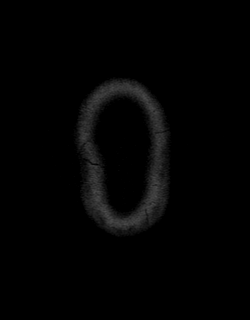

[Series 11: T2 · coronal · 5.0mm · 0.45mm/px · 2 of 25 slices shown (2 of 2)]
[im 1/25]
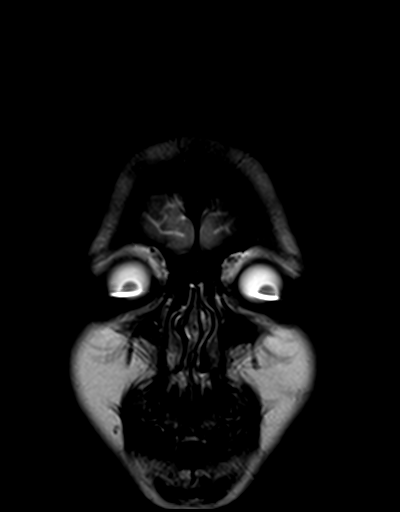
[im 25/25]
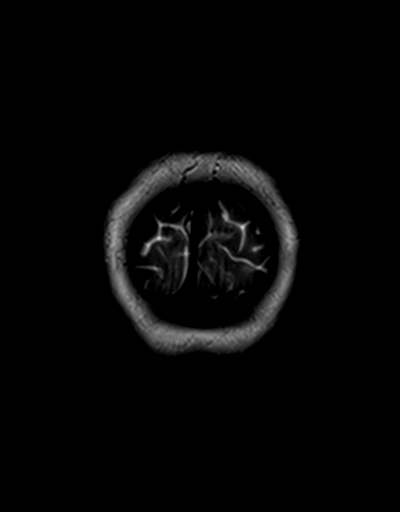

[Series 12: t1_mpr_tra · axial · 1.0mm · 0.72mm/px · z∈[-82,+60]mm · 11 of 144 slices shown (2 of 2)]
[im 1/144]
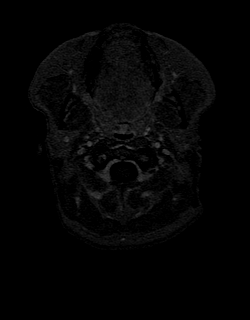
[im 15/144]
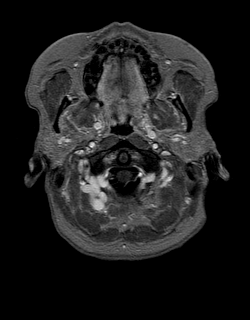
[im 29/144]
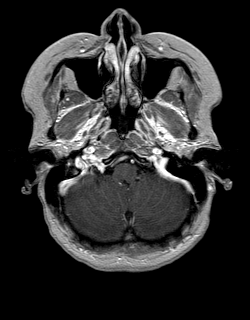
[im 43/144]
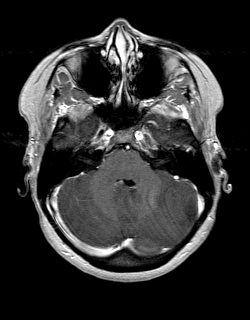
[im 58/144]
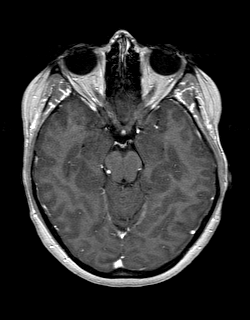
[im 72/144]
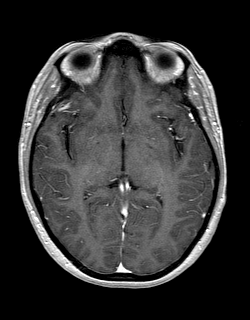
[im 86/144]
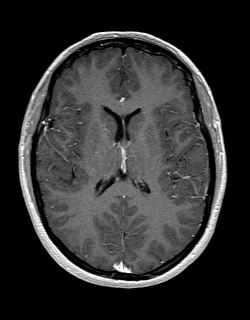
[im 101/144]
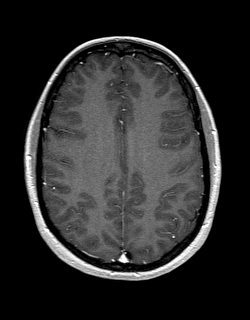
[im 115/144]
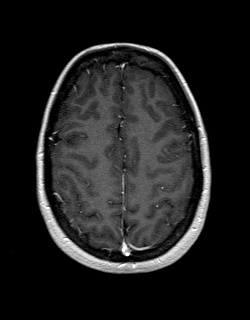
[im 129/144]
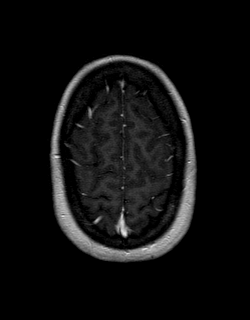
[im 144/144]
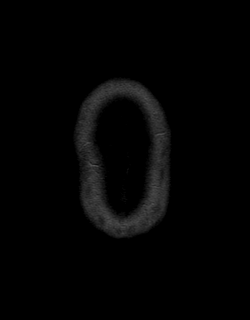

[Series 13: post cor · coronal · 5.0mm · 0.45mm/px · 2 of 25 slices shown]
[im 1/25]
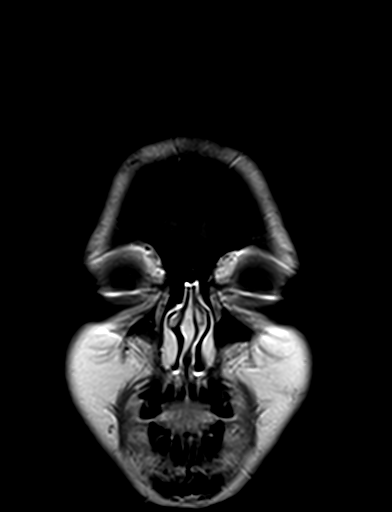
[im 25/25]
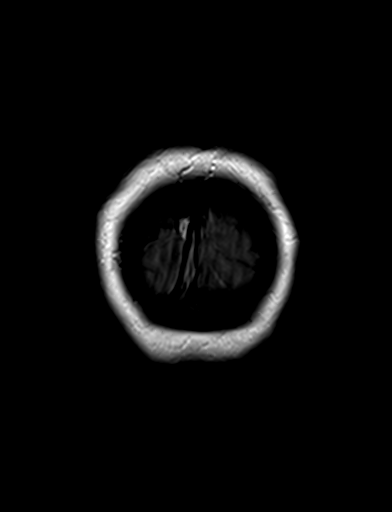

[48 of 48 positions shown; findings below may reference images not displayed]

FINDINGS: Brain: The brain has normal appearance without evidence of
malformation, atrophy, old or acute small or large vessel
infarction, hemorrhage, hydrocephalus or extra-axial collection. No
pituitary abnormality. After contrast administration, no abnormal
enhancement occurs.

Vascular: Major vessels at the base of the brain show flow.

Skull and upper cervical spine: Normal

Sinuses/Orbits: Clear/ normal.

Other: None significant.
IMPRESSION: Normal examination. No cause of the presenting symptoms is
identified.

## 2018-01-27 HISTORY — PX: TOTAL HIP ARTHROPLASTY: SHX124

## 2018-02-18 DIAGNOSIS — M25551 Pain in right hip: Secondary | ICD-10-CM | POA: Insufficient documentation

## 2018-02-18 DIAGNOSIS — M25512 Pain in left shoulder: Secondary | ICD-10-CM | POA: Diagnosis not present

## 2018-02-22 DIAGNOSIS — F411 Generalized anxiety disorder: Secondary | ICD-10-CM | POA: Diagnosis not present

## 2018-03-02 DIAGNOSIS — M25551 Pain in right hip: Secondary | ICD-10-CM | POA: Diagnosis not present

## 2018-03-05 DIAGNOSIS — M25551 Pain in right hip: Secondary | ICD-10-CM | POA: Diagnosis not present

## 2018-03-05 DIAGNOSIS — M25851 Other specified joint disorders, right hip: Secondary | ICD-10-CM | POA: Diagnosis not present

## 2018-03-05 DIAGNOSIS — M25512 Pain in left shoulder: Secondary | ICD-10-CM | POA: Diagnosis not present

## 2018-03-05 DIAGNOSIS — M25859 Other specified joint disorders, unspecified hip: Secondary | ICD-10-CM | POA: Insufficient documentation

## 2018-03-10 DIAGNOSIS — F411 Generalized anxiety disorder: Secondary | ICD-10-CM | POA: Diagnosis not present

## 2018-03-31 DIAGNOSIS — M25551 Pain in right hip: Secondary | ICD-10-CM | POA: Diagnosis not present

## 2018-04-06 DIAGNOSIS — M160 Bilateral primary osteoarthritis of hip: Secondary | ICD-10-CM | POA: Diagnosis not present

## 2018-04-06 DIAGNOSIS — M76891 Other specified enthesopathies of right lower limb, excluding foot: Secondary | ICD-10-CM | POA: Diagnosis not present

## 2018-04-06 DIAGNOSIS — M1611 Unilateral primary osteoarthritis, right hip: Secondary | ICD-10-CM | POA: Diagnosis not present

## 2018-04-07 DIAGNOSIS — M25551 Pain in right hip: Secondary | ICD-10-CM | POA: Diagnosis not present

## 2018-04-12 DIAGNOSIS — M25859 Other specified joint disorders, unspecified hip: Secondary | ICD-10-CM | POA: Diagnosis not present

## 2018-04-12 DIAGNOSIS — M1611 Unilateral primary osteoarthritis, right hip: Secondary | ICD-10-CM | POA: Diagnosis not present

## 2018-04-12 DIAGNOSIS — M25551 Pain in right hip: Secondary | ICD-10-CM | POA: Diagnosis not present

## 2018-06-22 ENCOUNTER — Ambulatory Visit (INDEPENDENT_AMBULATORY_CARE_PROVIDER_SITE_OTHER): Payer: BLUE CROSS/BLUE SHIELD | Admitting: Family Medicine

## 2018-06-22 ENCOUNTER — Other Ambulatory Visit: Payer: Self-pay

## 2018-06-22 ENCOUNTER — Encounter: Payer: Self-pay | Admitting: Family Medicine

## 2018-06-22 VITALS — BP 118/78 | HR 67 | Temp 98.9°F | Ht 65.0 in | Wt 152.9 lb

## 2018-06-22 DIAGNOSIS — Z01818 Encounter for other preprocedural examination: Secondary | ICD-10-CM | POA: Diagnosis not present

## 2018-06-22 DIAGNOSIS — R011 Cardiac murmur, unspecified: Secondary | ICD-10-CM

## 2018-06-22 NOTE — Progress Notes (Signed)
Impression and Recommendations:    1. Preoperative evaluation to rule out surgical contraindication   2. Cardiac murmur    Told patient to check with their surgeon/person doing procedure to see if they are to hold any medicines prior to the procedure. Preop eval: - pt does not require any further cardiac work up prior to hip surgery. -She is able to achieve greater than 4 METS without difficulty. -She has not on any NSAIDs or platelet inhibitors -EKG was done and compared to the one on September 24, 2016 with no change.Marland Kitchen.  She was in normal sinus rhythm with a ventricular rate of 68 independently interpreted by myself -Labs obtained per orthopedics preop risk evaluation form of EKG, CBC, CMP,( A1c - was done less than a year ago and was completely normal)  -Paper work will be completed and sent to Dr. Nilsa Nuttinglin's office once labs are returned/resulted.  We will include these in the paperwork  Heart murmur: Stable - Of note patient did have ETT on 09/25/2016 which was negative and it was not adequate stress test.  Also earlier in the month patient had an echocardiogram which was completely negative.   Orders Placed This Encounter  Procedures  . CBC with Differential/Platelet  . Comprehensive metabolic panel    Expresses verbal understanding and consents to current therapy and treatment regimen.  No barriers to understanding were identified.  Red flag symptoms and signs discussed in detail.  Patient expressed understanding regarding what to do in case of emergency\urgent symptoms  Please see AVS handed out to patient at the end of our visit for further patient instructions/ counseling done pertaining to today's office visit.   Return for Please follow-up for chronic conditions as discussed prior; physical exam with blood work end of Oct.     Note:  This note was prepared with assistance of Conservation officer, historic buildingsDragon voice recognition software. Occasional wrong-word or sound-a-like substitutions may have  occurred due to the inherent limitations of voice recognition software.      --------------------------------------------------------------------------------------------------------------------------------------------------------------------------------------------------------------------------------------------    Subjective:     HPI: Emily [name] is a 38 y.o. female who [facility] for issues as discussed below.   Patient is here for perioperative evaluation for right hip surgery-Having a right total hip done.   [facility]     Last lab work was done on patient 11/20/2017-this was when she had her last yearly physical with me. And it was the last time she saw me..  At that time, vitamin D was 29.2 and too low.  Otherwise all of her labs were within normal limit to include CBC, CMP, A1c, lipid panel, TSH and T4.  Patient's activities are greater than 4 METS which she can do without difficulty.  She can climb a flight of stairs or do heavy work around the house if it was not for her hip. -Patient without history of congestive heart failure, coronary artery disease, peripheral vascular disease, diabetes, COPD or asthma, obstructive sleep apnea or smoking.  We did send her to cardiology back in 2018 because of a heart murmur that was appreciated but that was it.       Wt Readings from Last 3 Encounters:  06/22/18 152 lb 14.4 oz (69.4 kg)  11/20/17 153 lb 8 oz (69.6 kg)  11/18/17 152 lb (68.9 kg)   BP Readings from Last 3 Encounters:  06/22/18 118/78  11/20/17 123/85  11/18/17  100/70   Pulse Readings from Last 3 Encounters:  06/22/18 67  11/20/17 75  11/18/17 70   BMI Readings from Last 3 Encounters:  06/22/18 25.44 kg/m  11/20/17 25.54 kg/m  11/18/17 25.69 kg/m     Patient Care Team    Relationship Specialty Notifications Start End  Thomasene Lot, DO PCP - General Family Medicine   09/24/16   Chilton Si, MD Attending Physician Cardiology  10/14/16   Jacqlyn Krauss, MD Referring Physician Dermatology  10/14/16   Van Clines, MD Consulting Physician Neurology  10/14/16   Verner Chol, CNM Referring Physician Certified Nurse Midwife  10/14/16   Clarita Leber, MD Attending Physician Orthopedic Surgery  06/22/18      Patient Active Problem List   Diagnosis Date Noted  . Cardiac murmur 05/10/2014    Priority: High  . History of depression 10/13/2016    Priority: Medium  . h/o Generalized anxiety disorder with panic attacks 10/13/2016    Priority: Medium  . Family history of psychiatric disorder 10/13/2016    Priority: Low  . Preoperative evaluation to rule out surgical contraindication 06/22/2018  . Femoral acetabular impingement 03/05/2018  . Pain in joint of right hip 02/18/2018  . Pain in joint of left shoulder 02/18/2018  . Flank pain 12/17/2016  . Dysuria 12/17/2016  . Tingling in extremities 10/13/2016  . Exercise intolerance 10/13/2016  . Dizziness, nonspecific 10/13/2016  . Chest discomfort 08/29/2016  . Allergic rhinitis 05/10/2014  . 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 Interstitial cystitis 04/25/2014    Past Medical history, Surgical history, Family history, Social history, Allergies and Medications have been entered into the medical record, reviewed and changed as needed.    Current Meds  Medication Sig  . fexofenadine (ALLEGRA) 180 MG tablet Take 180 mg by mouth daily.  . valACYclovir (VALTREX) 1000 MG tablet For oral HSV take two tablets twice daily x 1 day at onset of outbreak    Allergies:  Allergies  Allergen Reactions  . Other     LoLoestrin--made patient "feel depressed--suicidal"  . Quinolones     Rash   . Sulfa Antibiotics Swelling    "swelling of tongue, hives"     Review of Systems:  A fourteen system review of systems was performed and found to be positive as per HPI.   Objective:   Blood  pressure 118/78, pulse 67, temperature 98.9 F (37.2 C), height 5\' 5"  (1.651 m), weight 152 lb 14.4 oz (69.4 kg), last menstrual period 06/01/2018. Body mass index is 25.44 kg/m. General:  Well Developed, well nourished, appropriate for stated age.  Neuro:  Alert and oriented,  extra-ocular muscles intact  HEENT:  Normocephalic, atraumatic, neck supple, no carotid bruits appreciated  Skin:  no gross rash, warm, pink. Cardiac:  RRR, S1 S2; heart murmur unchanged from prior-2/6 holosystolic left upper sternal border Respiratory:  ECTA B/L and A/P, Not using accessory muscles, speaking in full sentences- unlabored. Vascular:  Ext warm, no cyanosis apprec.; cap RF less 2 sec. Psych:  No HI/SI, judgement and insight good, Euthymic mood. Full Affect.

## 2018-06-23 LAB — COMPREHENSIVE METABOLIC PANEL
ALT: 12 IU/L (ref 0–32)
AST: 14 IU/L (ref 0–40)
Albumin/Globulin Ratio: 1.8 (ref 1.2–2.2)
Albumin: 4.4 g/dL (ref 3.8–4.8)
Alkaline Phosphatase: 54 IU/L (ref 39–117)
BUN/Creatinine Ratio: 23 (ref 9–23)
BUN: 15 mg/dL (ref 6–20)
Bilirubin Total: 0.3 mg/dL (ref 0.0–1.2)
CO2: 23 mmol/L (ref 20–29)
Calcium: 9.2 mg/dL (ref 8.7–10.2)
Chloride: 102 mmol/L (ref 96–106)
Creatinine, Ser: 0.65 mg/dL (ref 0.57–1.00)
GFR calc Af Amer: 133 mL/min/{1.73_m2} (ref >59)
GFR calc non Af Amer: 115 mL/min/{1.73_m2} (ref >59)
Globulin, Total: 2.5 g/dL (ref 1.5–4.5)
Glucose: 91 mg/dL (ref 65–99)
Potassium: 3.9 mmol/L (ref 3.5–5.2)
Sodium: 138 mmol/L (ref 134–144)
Total Protein: 6.9 g/dL (ref 6.0–8.5)

## 2018-06-23 LAB — CBC WITH DIFFERENTIAL/PLATELET
Basophils Absolute: 0 10*3/uL (ref 0.0–0.2)
Basos: 1 %
EOS (ABSOLUTE): 0.3 10*3/uL (ref 0.0–0.4)
Eos: 4 %
Hematocrit: 37.8 % (ref 34.0–46.6)
Hemoglobin: 12.7 g/dL (ref 11.1–15.9)
Immature Grans (Abs): 0 10*3/uL (ref 0.0–0.1)
Immature Granulocytes: 0 %
Lymphocytes Absolute: 1.8 10*3/uL (ref 0.7–3.1)
Lymphs: 29 %
MCH: 28.8 pg (ref 26.6–33.0)
MCHC: 33.6 g/dL (ref 31.5–35.7)
MCV: 86 fL (ref 79–97)
Monocytes Absolute: 0.6 10*3/uL (ref 0.1–0.9)
Monocytes: 10 %
Neutrophils Absolute: 3.7 10*3/uL (ref 1.4–7.0)
Neutrophils: 56 %
Platelets: 240 10*3/uL (ref 150–450)
RBC: 4.41 x10E6/uL (ref 3.77–5.28)
RDW: 12.1 % (ref 11.7–15.4)
WBC: 6.4 10*3/uL (ref 3.4–10.8)

## 2018-06-30 ENCOUNTER — Other Ambulatory Visit: Payer: Self-pay

## 2018-06-30 ENCOUNTER — Other Ambulatory Visit (INDEPENDENT_AMBULATORY_CARE_PROVIDER_SITE_OTHER): Payer: BLUE CROSS/BLUE SHIELD

## 2018-06-30 ENCOUNTER — Telehealth: Payer: Self-pay | Admitting: Family Medicine

## 2018-06-30 DIAGNOSIS — M25859 Other specified joint disorders, unspecified hip: Secondary | ICD-10-CM | POA: Diagnosis not present

## 2018-06-30 DIAGNOSIS — Z01818 Encounter for other preprocedural examination: Secondary | ICD-10-CM | POA: Diagnosis not present

## 2018-06-30 DIAGNOSIS — M25551 Pain in right hip: Secondary | ICD-10-CM | POA: Diagnosis not present

## 2018-06-30 LAB — POCT URINALYSIS DIPSTICK
Bilirubin, UA: NEGATIVE
Glucose, UA: NEGATIVE
Ketones, UA: NEGATIVE
Leukocytes, UA: NEGATIVE
Nitrite, UA: NEGATIVE
Protein, UA: NEGATIVE
Spec Grav, UA: 1.01 (ref 1.010–1.025)
Urobilinogen, UA: 0.2 E.U./dL
pH, UA: 6 (ref 5.0–8.0)

## 2018-06-30 NOTE — Telephone Encounter (Signed)
Patient came by office to do a urine specimen as advised by medical assistant. --Emily [name]

## 2018-06-30 NOTE — Telephone Encounter (Signed)
Noted and UA ran. MPulliam, CMA/RT(R)

## 2018-07-12 DIAGNOSIS — M1631 Unilateral osteoarthritis resulting from hip dysplasia, right hip: Secondary | ICD-10-CM | POA: Diagnosis not present

## 2018-07-12 DIAGNOSIS — Z96641 Presence of right artificial hip joint: Secondary | ICD-10-CM | POA: Insufficient documentation

## 2018-07-12 DIAGNOSIS — M1611 Unilateral primary osteoarthritis, right hip: Secondary | ICD-10-CM | POA: Diagnosis not present

## 2018-10-06 DIAGNOSIS — Z471 Aftercare following joint replacement surgery: Secondary | ICD-10-CM | POA: Diagnosis not present

## 2018-10-06 DIAGNOSIS — Z96641 Presence of right artificial hip joint: Secondary | ICD-10-CM | POA: Diagnosis not present

## 2018-11-23 ENCOUNTER — Other Ambulatory Visit: Payer: Self-pay

## 2018-11-24 ENCOUNTER — Ambulatory Visit: Payer: BC Managed Care – PPO | Admitting: Certified Nurse Midwife

## 2018-11-26 DIAGNOSIS — R519 Headache, unspecified: Secondary | ICD-10-CM | POA: Diagnosis not present

## 2018-11-26 DIAGNOSIS — R509 Fever, unspecified: Secondary | ICD-10-CM | POA: Diagnosis not present

## 2018-11-26 DIAGNOSIS — J029 Acute pharyngitis, unspecified: Secondary | ICD-10-CM | POA: Diagnosis not present

## 2018-11-26 DIAGNOSIS — Z20828 Contact with and (suspected) exposure to other viral communicable diseases: Secondary | ICD-10-CM | POA: Diagnosis not present

## 2018-12-08 DIAGNOSIS — L298 Other pruritus: Secondary | ICD-10-CM | POA: Diagnosis not present

## 2018-12-10 ENCOUNTER — Telehealth: Payer: Self-pay | Admitting: Family Medicine

## 2018-12-10 NOTE — Telephone Encounter (Signed)
Pt called request name of Neurologist, Dr. Raliegh Scarlet referred her to several years ago (no reason given) ---reviewed pt chart located Dr. Narda Amber @ [facility].  fyi to med asst.  --glh

## 2018-12-20 ENCOUNTER — Encounter: Payer: Self-pay | Admitting: Certified Nurse Midwife

## 2018-12-20 ENCOUNTER — Other Ambulatory Visit: Payer: Self-pay

## 2018-12-20 ENCOUNTER — Ambulatory Visit: Payer: BC Managed Care – PPO | Admitting: Certified Nurse Midwife

## 2018-12-20 VITALS — BP 110/70 | HR 68 | Temp 97.2°F | Resp 16 | Ht 64.75 in | Wt 151.0 lb

## 2018-12-20 DIAGNOSIS — Z01419 Encounter for gynecological examination (general) (routine) without abnormal findings: Secondary | ICD-10-CM | POA: Diagnosis not present

## 2018-12-20 DIAGNOSIS — B009 Herpesviral infection, unspecified: Secondary | ICD-10-CM | POA: Diagnosis not present

## 2018-12-20 MED ORDER — VALACYCLOVIR HCL 1 G PO TABS: ORAL_TABLET | ORAL | 12 refills | Status: DC

## 2018-12-20 NOTE — Patient Instructions (Signed)

## 2018-12-20 NOTE — Progress Notes (Signed)
38 y.o. G53P2002 Married  Caucasian Fe here for annual exam. Periods normal, no issues, PMS much better. . Had total hip replacement on right, recuperated well, "Glad it is over with". No recent HSV 1 oral outbreak, only one in the past 7 months. [place] working well for post coital UTI prevention. Needs update of Rx. Sees PCP for aex, prn and labs. No other health issues today.  Patient's last menstrual period was 12/09/2018 (exact date).          Sexually active: Yes.    The current method of family planning is tubal ligation.    Exercising: No.  exercise Smoker:  no  Review of Systems  Constitutional: Negative.   HENT: Negative.   Eyes: Negative.   Respiratory: Negative.   Cardiovascular: Negative.   Gastrointestinal: Negative.   Genitourinary: Negative.   Musculoskeletal: Negative.   Skin: Negative.   Neurological: Negative.   Endo/Heme/Allergies: Negative.   Psychiatric/Behavioral: Negative.     Health Maintenance: Pap:  08-06-16 neg, 11-18-17 neg History of Abnormal Pap: yes MMG:  none Self Breast exams: yes Colonoscopy:  none BMD:   none TDaP:  2018 Shingles: no Pneumonia: no Hep C and HIV: both neg 2014 Labs: if needed   reports that she has never smoked. She has never used smokeless tobacco. She reports current alcohol use. She reports that she does not use drugs.  Past Medical History:  Diagnosis Date  . Abnormal Pap smear of cervix    2014 ASCUS HPV HR+, 2015 LSIL HPV HR+  . Depression   . GERD (gastroesophageal reflux disease)   . Heart murmur   . IBS (irritable bowel syndrome)   . Mitral valve prolapse   . MRSA (methicillin resistant Staphylococcus aureus)    infection on LT side of face, and forehead (02/2011)  . Nephrolithiasis   . Shingles   . UTI (urinary tract infection)     Past Surgical History:  Procedure Laterality Date  . CESAREAN SECTION  2007 & 2011   x2  . COLPOSCOPY  [id number]  . ESOPHAGOGASTRODUODENOSCOPY  03/25/2011   Procedure:  ESOPHAGOGASTRODUODENOSCOPY (EGD);  Surgeon: Missy Sabins, MD;  Location[facility] ENDOSCOPY;  Service: Endoscopy;  Laterality: N/A;  . SAVORY DILATION  03/25/2011   Procedure: SAVORY DILATION;  Surgeon: Missy Sabins, MD;  Location: [facility];  Service: Endoscopy;  Laterality: N/A;  . TOTAL HIP ARTHROPLASTY  2020   right  . TUBAL LIGATION  2011  . WISDOM TOOTH EXTRACTION  2003    Current Outpatient Medications  Medication Sig Dispense Refill  . fexofenadine (ALLEGRA) 180 MG tablet Take 180 mg by mouth daily.    . valACYclovir (VALTREX) 1000 MG tablet For oral HSV take two tablets twice daily x 1 day at onset of outbreak 30 tablet 12   No current facility-administered medications for this visit.     Family History  Problem Relation Age of Onset  . Diabetes Mother        Type 2  . Cancer Maternal Grandmother   . Breast cancer Maternal Grandmother 4  . Cancer Maternal Grandfather   . Diabetes Maternal Grandfather        Type 1   . Lung cancer Maternal Grandfather   . Arthritis Father   . Other Sister        Agenesis of corpus collosum  . Heart attack Paternal Grandfather   . Lupus Paternal Aunt     ROS:  Pertinent items are noted in HPI.  Otherwise, a comprehensive ROS was negative.  Exam:   BP 110/70   Pulse 68   Temp (!) 97.2 F (36.2 C) (Skin)   Resp 16   Ht 5' 4.75" (1.645 m)   Wt 151 lb (68.5 kg)   LMP 12/09/2018 (Exact Date)   BMI 25.32 kg/m  Height: 5' 4.75" (164.5 cm) Ht Readings from Last 3 Encounters:  12/20/18 5' 4.75" (1.645 m)  06/22/18 5\' 5"  (1.651 m)  11/20/17 5\' 5"  (1.651 m)    General appearance: alert, cooperative and appears stated age Head: Normocephalic, without obvious abnormality, atraumatic Neck: no adenopathy, supple, symmetrical, trachea midline and thyroid normal to inspection and palpation Lungs: clear to auscultation bilaterally Breasts: normal appearance, no masses or tenderness, No nipple retraction or dimpling, No nipple discharge or  bleeding, No axillary or supraclavicular adenopathy Heart: regular rate and rhythm Abdomen: soft, non-tender; no masses,  no organomegaly Extremities: extremities normal, atraumatic, no cyanosis or edema Skin: Skin color, texture, turgor normal. No rashes or lesions Lymph nodes: Cervical, supraclavicular, and axillary nodes normal. No abnormal inguinal nodes palpated Neurologic: Grossly normal   Pelvic: External genitalia:  no lesions              Urethra:  normal appearing urethra with no masses, tenderness or lesions              Bartholin's and Skene's: normal                 Vagina: normal appearing vagina with normal color and discharge, no lesions              Cervix: no cervical motion tenderness, no lesions and normal appearance              Pap taken: No. Bimanual Exam:  Uterus:  normal size, contour, position, consistency, mobility, non-tender and anteverted              Adnexa: normal adnexa and no mass, fullness, tenderness               Rectovaginal: Confirms               Anus:  normal sphincter tone, no lesions  Chaperone present: yes  A:  Well Woman with normal exam  Contraception BTL  Recent right hip replacement, walking without assistance now  History of oral HSV needs Rx update  PCP management of labs with aex.    P:   Reviewed health and wellness pertinent to exam  Discussed importance monitoring periods if change.  Continue follow up as indicated regarding hip.  Rx Valtrex see order with instructions  Pap smear: no  counseled on breast self exam, feminine hygiene, adequate intake of calcium and vitamin D, diet and exercise return annually or prn  An After Visit Summary was printed and given to the patient.

## 2018-12-30 ENCOUNTER — Telehealth: Payer: Self-pay | Admitting: Family Medicine

## 2018-12-30 DIAGNOSIS — Z0000 Encounter for general adult medical examination without abnormal findings: Secondary | ICD-10-CM

## 2018-12-30 NOTE — Telephone Encounter (Signed)
Future labs ordered. AS, CMA 

## 2018-12-30 NOTE — Telephone Encounter (Signed)
Patient called and set up a physical appt for 01/26/2019 and will need Lab Orders put in for that day as well. Please Advise. - AM

## 2019-01-26 ENCOUNTER — Ambulatory Visit (INDEPENDENT_AMBULATORY_CARE_PROVIDER_SITE_OTHER): Payer: BC Managed Care – PPO | Admitting: Family Medicine

## 2019-01-26 ENCOUNTER — Other Ambulatory Visit: Payer: BC Managed Care – PPO

## 2019-01-26 ENCOUNTER — Other Ambulatory Visit: Payer: Self-pay

## 2019-01-26 ENCOUNTER — Encounter: Payer: Self-pay | Admitting: Family Medicine

## 2019-01-26 VITALS — BP 110/75 | HR 79 | Temp 98.3°F | Resp 12 | Ht 65.0 in | Wt 155.0 lb

## 2019-01-26 DIAGNOSIS — Z719 Counseling, unspecified: Secondary | ICD-10-CM | POA: Diagnosis not present

## 2019-01-26 DIAGNOSIS — Z0000 Encounter for general adult medical examination without abnormal findings: Secondary | ICD-10-CM

## 2019-01-26 DIAGNOSIS — Z23 Encounter for immunization: Secondary | ICD-10-CM | POA: Diagnosis not present

## 2019-01-26 DIAGNOSIS — R946 Abnormal results of thyroid function studies: Secondary | ICD-10-CM

## 2019-01-26 DIAGNOSIS — Z0001 Encounter for general adult medical examination with abnormal findings: Secondary | ICD-10-CM

## 2019-01-26 NOTE — Progress Notes (Signed)
Impression and Recommendations:    1. Encounter for general adult medical examination with abnormal findings   2. Laboratory tests ordered as part of a complete physical exam (CPE)   3. Health education/counseling   4. Thyroid function test abnormal   5. Need for influenza vaccination     1) Anticipatory Guidance: Discussed importance of wearing a seatbelt while driving, not texting while driving; sunscreen when outside along with yearly skin surveillance; eating a well balanced and modest diet; physical activity at least 25 minutes per day or 150 min/ week of moderate to intense activity.   - Patient follows up with dermatologist for skin concerns.   - Advised patient to follow up with rheumatology as established.   - Encouraged patient to call the office where she obtained her hip replacement surgery and request physical therapy.  Advised patient to be honest regarding her loss of strength and ROM.   2) Immunizations / Screenings / Labs:   All immunizations and screenings that patient agrees to, are up-to-date per recommendations or will be updated today.  Patient understands the needs for q 81mo dental and yearly vision screens which pt will schedule independently. Obtain CBC, CMP, HgA1c, Lipid panel, TSH and vit D when fasting if not already done recently.   - Education provided to patient today regarding need for all applicable immunizations and screenings. Discussed guidelines extensively with patient.  All questions answered.  - Discussed need to begin mammography screening at age 67.  - Last TDAP obtained in 2018; UTD.  Repeat in 10 years.  - Given patient's personal history of shingles outbreak, advised obtaining vaccination as soon as she is able.  - Encouraged patient to obtain flu shot, especially this year during COVID-19.   3) Health Counseling & Preventative Maintenance Discussed goal of losing even 5-10% of current body weight which would improve overall  feelings of well being and improve objective health data significantly.   Improve nutrient density of diet through increasing intake of fruits and vegetables and decreasing saturated/trans fats, white flour products and refined sugar products.   - Advised patient to continue working toward exercising to improve overall mental, physical, and emotional health.    - Reviewed the "spokes of the wheel" of mood and health management.  Stressed the importance of ongoing prudent habits, including regular exercise, appropriate sleep hygiene, healthful dietary habits, and prayer/meditation to relax.  - Encouraged patient to engage in daily physical activity, especially a formal exercise routine.  Recommended that the patient eventually strive for at least 150 minutes of moderate cardiovascular activity per week according to guidelines established by the [facility]   - Healthy dietary habits encouraged, including low-carb, and high amounts of lean protein in diet.   - Patient should also consume adequate amounts of water.  Recommendations - Return near future for blood work and address chronic issues/ concerns.   Orders Placed This Encounter  Procedures  . Flu Vaccine QUAD 6+ mos PF IM (Fluarix Quad PF)  . Thyroid stimulating immunoglobulin  . Thyroglobulin antibody    Return for f/up near future to discuss labs and additional concerns, DOXY.    Gross side effects, risk and benefits, and alternatives of medications discussed with patient.  Patient is aware that all medications have potential side effects and we are unable to predict every side effect or drug-drug interaction that may occur.  Expresses verbal understanding and consents to current therapy plan and treatment regimen.  F-up preventative CPE in 1 year-  reminded pt again, this is in addition to any chronic care visits.    Please see orders placed and AVS handed out to patient at the end of our visit for further patient instructions/  counseling done pertaining to today's office visit.  This document serves as a record of services personally performed by Thomasene Lot, DO. It was created on her behalf by Peggye Fothergill, a trained medical scribe. The creation of this record is based on the scribe's personal observations and the provider's statements to them.   This case required medical decision making of at least moderate complexity. The above documentation has been reviewed to be accurate and was completed by Carlye Grippe, D.O.     Subjective:    I, Peggye Fothergill, am serving as scribe for Dr. Thomasene Lot.  Chief Complaint  Patient presents with  . Annual Exam    HPI: Emily [name] is a 38 y.o. female who presents to [facility] today a yearly health maintenance exam.  Health Maintenance Summary Reviewed and updated, unless pt declines services.  Colonoscopy:  Denies first-degree family history of colon cancer. Tobacco History Reviewed:   Y; never smoker. Alcohol:  No concerns, no excessive use Exercise Habits:  Not meeting AHA guidelines; recently had a hip replacement, and her recovery was "so much worse than they said."  States she had to use a walker for four months.  Says the area of her surgery still feels swollen.  Notes she was not sent to physical therapy. STD concerns:   None. Drug Use:   None, Birth control method:  Managed by CNM. Menses regular:  Managed by CNM. Lumps or breast concerns:  None reported. Breast Cancer Family History:   Maternal grandmother had breast cancer.   Notes Christmas was good, but smaller this year during COVID-19.  Notes her son tested positive for the flu and had pneumonia this past February.  States he may have had COVID-19 without anyone being aware, because this was before they were testing.  He had to be hospitalized after difficulty breathing, received several steroid shots, had to use nebulizer, etc.  Patient  notes this experience was very stressful for her.   - General Health Concerns Says for years she has thought she has something going with her heatlh that is autoimmune, but her symptoms keep coming and going.  She began following up with a homeopathic practitioner, Joie Bimler, DC.  States that he thinks her concerns are autoimmune related, too.  States she went to the neurologist as advised in the past, and had tests done for MS.  After this, her symptoms went away.  Notes she did not follow up with rheumatology as recommended.   - Endocrine Health Notes she cancelled her appointment with endocrinology because her symptoms went away.   - Dietary Habits Says she's been trying to eat very healthy, and avoid too much gluten/carbs.   - Female Health She has been to her CNM, Leota Sauers, once yearly. During these vitis patient does obtain breast exam and female exam.   - History of Shingles Notes she has had shingles already in life.   - Visual Health States she obtains eye exams regularly as advised.   - Skin Health Notes she follows up with dermatology PRN because her skin itches constantly.    Immunization History  Administered Date(s) Administered  . Influenza Inj Mdck Quad Pf 10/10/2017  . Influenza,inj,Quad PF,6+ Mos 01/26/2019  . Influenza-Unspecified 10/10/2017  .  Tdap 08/06/2016    Health Maintenance  Topic Date Due  . PAP SMEAR-Modifier  11/18/2020  . TETANUS/TDAP  08/07/2026  . INFLUENZA VACCINE  Completed  . HIV Screening  Completed     Wt Readings from Last 3 Encounters:  01/26/19 155 lb (70.3 kg)  12/20/18 151 lb (68.5 kg)  06/22/18 152 lb 14.4 oz (69.4 kg)   BP Readings from Last 3 Encounters:  01/26/19 110/75  12/20/18 110/70  06/22/18 118/78   Pulse Readings from Last 3 Encounters:  01/26/19 79  12/20/18 68  06/22/18 67     Past Medical History:  Diagnosis Date  . Abnormal Pap smear of cervix    2014 ASCUS HPV HR+, 2015 LSIL HPV  HR+  . Depression   . GERD (gastroesophageal reflux disease)   . Heart murmur   . IBS (irritable bowel syndrome)   . Mitral valve prolapse   . MRSA (methicillin resistant Staphylococcus aureus)    infection on LT side of face, and forehead (02/2011)  . Nephrolithiasis   . Shingles   . UTI (urinary tract infection)       Past Surgical History:  Procedure Laterality Date  . CESAREAN SECTION  2007 & 2011   x2  . COLPOSCOPY  [id number]  . ESOPHAGOGASTRODUODENOSCOPY  03/25/2011   Procedure: ESOPHAGOGASTRODUODENOSCOPY (EGD);  Surgeon: Barrie FolkJohn C Hayes, MD;  [facility];  Service: Endoscopy;  Laterality: N/A;  . SAVORY DILATION  03/25/2011   Procedure: SAVORY DILATION;  Surgeon: Barrie FolkJohn C Hayes, MD;  [facility];  Service: Endoscopy;  Laterality: N/A;  . TOTAL HIP ARTHROPLASTY  2020   right  . TUBAL LIGATION  2011  . WISDOM TOOTH EXTRACTION  2003      Family History  Problem Relation Age of Onset  . Diabetes Mother        Type 2  . Cancer Maternal Grandmother   . Breast cancer Maternal Grandmother 50  . Cancer Maternal Grandfather   . Diabetes Maternal Grandfather        Type 1   . Lung cancer Maternal Grandfather   . Arthritis Father   . Other Sister        Agenesis of corpus collosum  . Heart attack Paternal Grandfather   . Lupus Paternal Aunt       Social History   Substance and Sexual Activity  Drug Use No  ,   Social History   Substance and Sexual Activity  Alcohol Use Yes  . Alcohol/week: 0.0 standard drinks   Comment: 2 a month  ,   Social History   Tobacco Use  Smoking Status Never Smoker  Smokeless Tobacco Never Used  ,   Social History   Substance and Sexual Activity  Sexual Activity Yes  . Partners: Male  . Birth control/protection: Surgical   Comment: Tubal    Current Outpatient Medications on File Prior to Visit  Medication Sig Dispense Refill  . fexofenadine (ALLEGRA) 180 MG tablet Take 180 mg by mouth daily.    .  valACYclovir (VALTREX) 1000 MG tablet For oral HSV take two tablets twice daily x 1 day at onset of outbreak 30 tablet 12   No current facility-administered medications on file prior to visit.    Allergies: Other, Quinolones, and Sulfa antibiotics  Review of Systems: General:   Denies fever, chills, unexplained weight loss.  Optho/Auditory:   Denies visual changes, blurred vision/LOV Respiratory:   Denies SOB, DOE more than baseline levels.   Cardiovascular:  Denies chest pain, palpitations, new onset peripheral edema  Gastrointestinal:   Denies nausea, vomiting, diarrhea.  Genitourinary: Denies dysuria, freq/ urgency, flank pain or discharge from genitals.  Endocrine:     Denies hot or cold intolerance, polyuria, polydipsia. Musculoskeletal:   Denies unexplained myalgias, joint swelling, unexplained arthralgias, gait problems.  Skin:  Denies rash, suspicious lesions Neurological:     Denies dizziness, unexplained weakness, numbness  Psychiatric/Behavioral:   Denies mood changes, suicidal or homicidal ideations, hallucinations    Objective:    Blood pressure 110/75, pulse 79, temperature 98.3 F (36.8 C), temperature source Oral, resp. rate 12, height 5\' 5"  (1.651 m), weight 155 lb (70.3 kg), last menstrual period 01/05/2019, SpO2 100 %. Body mass index is 25.79 kg/m. General Appearance:    Alert, cooperative, no distress, appears stated age  Head:    Normocephalic, without obvious abnormality, atraumatic  Eyes:    PERRL, conjunctiva/corneas clear, EOM's intact, fundi    benign, both eyes  Ears:    Normal TM's and external ear canals, both ears  Nose:   Nares normal, septum midline, mucosa normal, no drainage    or sinus tenderness  Throat:   Lips w/o lesion, mucosa moist, and tongue normal; teeth and   gums normal  Neck:   Supple, symmetrical, trachea midline, no adenopathy;    thyroid:  no enlargement/tenderness/nodules; no carotid   bruit or JVD  Back:     Symmetric, no  curvature, ROM normal, no CVA tenderness  Lungs:     Clear to auscultation bilaterally, respirations unlabored, no       Wh/ R/ R  Chest Wall:    No tenderness or gross deformity; normal excursion   Heart:    Regular rate and rhythm, S1 and S2 normal, no murmur, rub   or gallop  Breast Exam:    Deferred to CNM.  Abdomen:     Soft, non-tender, bowel sounds active all four quadrants, NO   G/R/R, no masses, no organomegaly  Genitalia:    Deferred to CNM.   Rectal:    Deferred to CNM.  Extremities:   Extremities normal, atraumatic, no cyanosis or gross edema  Pulses:   2+ and symmetric all extremities  Skin:   Warm, dry, Skin color, texture, turgor normal, no obvious rashes or lesions Psych: No HI/SI, judgement and insight good, Euthymic mood. Full Affect.  Neurologic:   CNII-XII intact, normal strength, sensation and reflexes    Throughout

## 2019-01-26 NOTE — Patient Instructions (Addendum)
Dr. Estanislado Pandy of rheumatology is whom you had seen in the past.   This would be the best place to start if you thought you had an autoimmune disorder and needed to be evaluated for that.     Preventive Care for Adults, Female  A healthy lifestyle and preventive care can promote health and wellness. Preventive health guidelines for women include the following key practices.   A routine yearly physical is a good way to check with your health care provider about your health and preventive screening. It is a chance to share any concerns and updates on your health and to receive a thorough exam.   Visit your dentist for a routine exam and preventive care every 6 months. Brush your teeth twice a day and floss once a day. Good oral hygiene prevents tooth decay and gum disease.   The frequency of eye exams is based on your age, health, family medical history, use of contact lenses, and other factors. Follow your health care provider's recommendations for frequency of eye exams.   Eat a healthy diet. Foods like vegetables, fruits, whole grains, low-fat dairy products, and lean protein foods contain the nutrients you need without too many calories. Decrease your intake of foods high in solid fats, added sugars, and salt. Eat the right amount of calories for you.Get information about a proper diet from your health care provider, if necessary.   Regular physical exercise is one of the most important things you can do for your health. Most adults should get at least 150 minutes of moderate-intensity exercise (any activity that increases your heart rate and causes you to sweat) each week. In addition, most adults need muscle-strengthening exercises on 2 or more days a week.   Maintain a healthy weight. The body mass index (BMI) is a screening tool to identify possible weight problems. It provides an estimate of body fat based on height and weight. Your health care provider can find your BMI, and can help  you achieve or maintain a healthy weight.For adults 20 years and older:   - A BMI below 18.5 is considered underweight.   - A BMI of 18.5 to 24.9 is normal.   - A BMI of 25 to 29.9 is considered overweight.   - A BMI of 30 and above is considered obese.   Maintain normal blood lipids and cholesterol levels by exercising and minimizing your intake of trans and saturated fats.  Eat a balanced diet with plenty of fruit and vegetables. Blood tests for lipids and cholesterol should begin at age 81 and be repeated every 5 years minimum.  If your lipid or cholesterol levels are high, you are over 40, or you are at high risk for heart disease, you may need your cholesterol levels checked more frequently.Ongoing high lipid and cholesterol levels should be treated with medicines if diet and exercise are not working.   If you smoke, find out from your health care provider how to quit. If you do not use tobacco, do not start.   Lung cancer screening is recommended for adults aged 41-80 years who are at high risk for developing lung cancer because of a history of smoking. A yearly low-dose CT scan of the lungs is recommended for people who have at least a 30-pack-year history of smoking and are a current smoker or have quit within the past 15 years. A pack year of smoking is smoking an average of 1 pack of cigarettes a day for 1 year (for  example: 1 pack a day for 30 years or 2 packs a day for 15 years). Yearly screening should continue until the smoker has stopped smoking for at least 15 years. Yearly screening should be stopped for people who develop a health problem that would prevent them from having lung cancer treatment.   If you are pregnant, do not drink alcohol. If you are breastfeeding, be very cautious about drinking alcohol. If you are not pregnant and choose to drink alcohol, do not have more than 1 drink per day. One drink is considered to be 12 ounces (355 mL) of beer, 5 ounces (148 mL) of wine,  or 1.5 ounces (44 mL) of liquor.   Avoid use of street drugs. Do not share needles with anyone. Ask for help if you need support or instructions about stopping the use of drugs.   High blood pressure causes heart disease and increases the risk of stroke. Your blood pressure should be checked at least yearly.  Ongoing high blood pressure should be treated with medicines if weight loss and exercise do not work.   If you are 63-35 years old, ask your health care provider if you should take aspirin to prevent strokes.   Diabetes screening involves taking a blood sample to check your fasting blood sugar level. This should be done once every 3 years, after age 20, if you are within normal weight and without risk factors for diabetes. Testing should be considered at a younger age or be carried out more frequently if you are overweight and have at least 1 risk factor for diabetes.   Breast cancer screening is essential preventive care for women. You should practice "breast self-awareness."  This means understanding the normal appearance and feel of your breasts and may include breast self-examination.  Any changes detected, no matter how small, should be reported to a health care provider.  Women in their 30s and 30s should have a clinical breast exam (CBE) by a health care provider as part of a regular health exam every 1 to 3 years.  After age 65, women should have a CBE every year.  Starting at age 45, women should consider having a mammogram (breast X-ray test) every year.  Women who have a family history of breast cancer should talk to their health care provider about genetic screening.  Women at a high risk of breast cancer should talk to their health care providers about having an MRI and a mammogram every year.   -Breast cancer gene (BRCA)-related cancer risk assessment is recommended for women who have family members with BRCA-related cancers. BRCA-related cancers include breast, ovarian, tubal,  and peritoneal cancers. Having family members with these cancers may be associated with an increased risk for harmful changes (mutations) in the breast cancer genes BRCA1 and BRCA2. Results of the assessment will determine the need for genetic counseling and BRCA1 and BRCA2 testing.   The Pap test is a screening test for cervical cancer. A Pap test can show cell changes on the cervix that might become cervical cancer if left untreated. A Pap test is a procedure in which cells are obtained and examined from the lower end of the uterus (cervix).   - Women should have a Pap test starting at age 74.   - Between ages 89 and 68, Pap tests should be repeated every 2 years.   - Beginning at age 4, you should have a Pap test every 3 years as long as the past 3 Pap tests  have been normal.   - Some women have medical problems that increase the chance of getting cervical cancer. Talk to your health care provider about these problems. It is especially important to talk to your health care provider if a new problem develops soon after your last Pap test. In these cases, your health care provider may recommend more frequent screening and Pap tests.   - The above recommendations are the same for women who have or have not gotten the vaccine for human papillomavirus (HPV).   - If you had a hysterectomy for a problem that was not cancer or a condition that could lead to cancer, then you no longer need Pap tests. Even if you no longer need a Pap test, a regular exam is a good idea to make sure no other problems are starting.   - If you are between ages 25 and 26 years, and you have had normal Pap tests going back 10 years, you no longer need Pap tests. Even if you no longer need a Pap test, a regular exam is a good idea to make sure no other problems are starting.   - If you have had past treatment for cervical cancer or a condition that could lead to cancer, you need Pap tests and screening for cancer for at least  20 years after your treatment.   - If Pap tests have been discontinued, risk factors (such as a new sexual partner) need to be reassessed to determine if screening should be resumed.   - The HPV test is an additional test that may be used for cervical cancer screening. The HPV test looks for the virus that can cause the cell changes on the cervix. The cells collected during the Pap test can be tested for HPV. The HPV test could be used to screen women aged 53 years and older, and should be used in women of any age who have unclear Pap test results. After the age of 14, women should have HPV testing at the same frequency as a Pap test.   Colorectal cancer can be detected and often prevented. Most routine colorectal cancer screening begins at the age of 46 years and continues through age 69 years. However, your health care provider may recommend screening at an earlier age if you have risk factors for colon cancer. On a yearly basis, your health care provider may provide home test kits to check for hidden blood in the stool.  Use of a small camera at the end of a tube, to directly examine the colon (sigmoidoscopy or colonoscopy), can detect the earliest forms of colorectal cancer. Talk to your health care provider about this at age 40, when routine screening begins. Direct exam of the colon should be repeated every 5 -10 years through age 103 years, unless early forms of pre-cancerous polyps or small growths are found.   People who are at an increased risk for hepatitis B should be screened for this virus. You are considered at high risk for hepatitis B if:  -You were born in a country where hepatitis B occurs often. Talk with your health care provider about which countries are considered high risk.  - Your parents were born in a high-risk country and you have not received a shot to protect against hepatitis B (hepatitis B vaccine).  - You have HIV or AIDS.  - You use needles to inject street drugs.  -  You live with, or have sex with, someone who has Hepatitis  B.  - You get hemodialysis treatment.  - You take certain medicines for conditions like cancer, organ transplantation, and autoimmune conditions.   Hepatitis C blood testing is recommended for all people born from [birth year] through 1965 and any individual with known risks for hepatitis C.   Practice safe sex. Use condoms and avoid high-risk sexual practices to reduce the spread of sexually transmitted infections (STIs). STIs include gonorrhea, chlamydia, syphilis, trichomonas, herpes, HPV, and human immunodeficiency virus (HIV). Herpes, HIV, and HPV are viral illnesses that have no cure. They can result in disability, cancer, and death. Sexually active women aged 46 years and younger should be checked for chlamydia. Older women with new or multiple partners should also be tested for chlamydia. Testing for other STIs is recommended if you are sexually active and at increased risk.   Osteoporosis is a disease in which the bones lose minerals and strength with aging. This can result in serious bone fractures or breaks. The risk of osteoporosis can be identified using a bone density scan. Women ages 27 years and over and women at risk for fractures or osteoporosis should discuss screening with their health care providers. Ask your health care provider whether you should take a calcium supplement or vitamin D to There are also several preventive steps women can take to avoid osteoporosis and resulting fractures or to keep osteoporosis from worsening. -->Recommendations include:  Eat a balanced diet high in fruits, vegetables, calcium, and vitamins.  Get enough calcium. The recommended total intake of is 1,200 mg daily; for best absorption, if taking supplements, divide doses into 250-500 mg doses throughout the day. Of the two types of calcium, calcium carbonate is best absorbed when taken with food but calcium citrate can be taken on an empty  stomach.  Get enough vitamin D. [employer] and the [place] recommend at least 1,000 IU per day for women age 70 and over who are at risk of vitamin D deficiency. Vitamin D deficiency can be caused by inadequate sun exposure (for example, those who live in [place]).  Avoid alcohol and smoking. Heavy alcohol intake (more than 7 drinks per week) increases the risk of falls and hip fracture and women smokers tend to lose bone more rapidly and have lower bone mass than nonsmokers. Stopping smoking is one of the most important changes women can make to improve their health and decrease risk for disease.  Be physically active every day. Weight-bearing exercise (for example, fast walking, hiking, jogging, and weight training) may strengthen bones or slow the rate of bone loss that comes with aging. Balancing and muscle-strengthening exercises can reduce the risk of falling and fracture.  Consider therapeutic medications. Currently, several types of effective drugs are available. Healthcare providers can recommend the type most appropriate for each woman.  Eliminate environmental factors that may contribute to accidents. Falls cause nearly 90% of all osteoporotic fractures, so reducing this risk is an important bone-health strategy. Measures include ample lighting, removing obstructions to walking, using nonskid rugs on floors, and placing mats and/or grab bars in showers.  Be aware of medication side effects. Some common medicines make bones weaker. These include a type of steroid drug called glucocorticoids used for arthritis and asthma, some antiseizure drugs, certain sleeping pills, treatments for endometriosis, and some cancer drugs. An overactive thyroid gland or using too much thyroid hormone for an underactive thyroid can also be a problem. If you are taking these medicines, talk to your doctor about what you can  do to help protect your bones.reduce the rate of  osteoporosis.    Menopause can be associated with physical symptoms and risks. Hormone replacement therapy is available to decrease symptoms and risks. You should talk to your health care provider about whether hormone replacement therapy is right for you.   Use sunscreen. Apply sunscreen liberally and repeatedly throughout the day. You should seek shade when your shadow is shorter than you. Protect yourself by wearing long sleeves, pants, a wide-brimmed hat, and sunglasses year round, whenever you are outdoors.   Once a month, do a whole body skin exam, using a mirror to look at the skin on your back. Tell your health care provider of new moles, moles that have irregular borders, moles that are larger than a pencil eraser, or moles that have changed in shape or color.   -Stay current with required vaccines (immunizations).   Influenza vaccine. All adults should be immunized every year.  Tetanus, diphtheria, and acellular pertussis (Td, Tdap) vaccine. Pregnant women should receive 1 dose of Tdap vaccine during each pregnancy. The dose should be obtained regardless of the length of time since the last dose. Immunization is preferred during the 27th 36th week of gestation. An adult who has not previously received Tdap or who does not know her vaccine status should receive 1 dose of Tdap. This initial dose should be followed by tetanus and diphtheria toxoids (Td) booster doses every 10 years. Adults with an unknown or incomplete history of completing a 3-dose immunization series with Td-containing vaccines should begin or complete a primary immunization series including a Tdap dose. Adults should receive a Td booster every 10 years.  Varicella vaccine. An adult without evidence of immunity to varicella should receive 2 doses or a second dose if she has previously received 1 dose. Pregnant females who do not have evidence of immunity should receive the first dose after pregnancy. This first dose  should be obtained before leaving the health care facility. The second dose should be obtained 4 8 weeks after the first dose.  Human papillomavirus (HPV) vaccine. Females aged 61 26 years who have not received the vaccine previously should obtain the 3-dose series. The vaccine is not recommended for use in pregnant females. However, pregnancy testing is not needed before receiving a dose. If a female is found to be pregnant after receiving a dose, no treatment is needed. In that case, the remaining doses should be delayed until after the pregnancy. Immunization is recommended for any person with an immunocompromised condition through the age of 34 years if she did not get any or all doses earlier. During the 3-dose series, the second dose should be obtained 4 8 weeks after the first dose. The third dose should be obtained 24 weeks after the first dose and 16 weeks after the second dose.  Zoster vaccine. One dose is recommended for adults aged 104 years or older unless certain conditions are present.  Measles, mumps, and rubella (MMR) vaccine. Adults born before [birth year] generally are considered immune to measles and mumps. Adults born in [birth year] or later should have 1 or more doses of MMR vaccine unless there is a contraindication to the vaccine or there is laboratory evidence of immunity to each of the three diseases. A routine second dose of MMR vaccine should be obtained at least 28 days after the first dose for students attending postsecondary schools, health care workers, or international travelers. People who received inactivated measles vaccine or an unknown type of measles  vaccine during 1963 1967 should receive 2 doses of MMR vaccine. People who received inactivated mumps vaccine or an unknown type of mumps vaccine before 1979 and are at high risk for mumps infection should consider immunization with 2 doses of MMR vaccine. For females of childbearing age, rubella immunity should be determined. If there is  no evidence of immunity, females who are not pregnant should be vaccinated. If there is no evidence of immunity, females who are pregnant should delay immunization until after pregnancy. Unvaccinated health care workers born before [birth year] who lack laboratory evidence of measles, mumps, or rubella immunity or laboratory confirmation of disease should consider measles and mumps immunization with 2 doses of MMR vaccine or rubella immunization with 1 dose of MMR vaccine.  Pneumococcal 13-valent conjugate (PCV13) vaccine. When indicated, a person who is uncertain of her immunization history and has no record of immunization should receive the PCV13 vaccine. An adult aged 7 years or older who has certain medical conditions and has not been previously immunized should receive 1 dose of PCV13 vaccine. This PCV13 should be followed with a dose of pneumococcal polysaccharide (PPSV23) vaccine. The PPSV23 vaccine dose should be obtained at least 8 weeks after the dose of PCV13 vaccine. An adult aged 48 years or older who has certain medical conditions and previously received 1 or more doses of PPSV23 vaccine should receive 1 dose of PCV13. The PCV13 vaccine dose should be obtained 1 or more years after the last PPSV23 vaccine dose.  Pneumococcal polysaccharide (PPSV23) vaccine. When PCV13 is also indicated, PCV13 should be obtained first. All adults aged 21 years and older should be immunized. An adult younger than age 58 years who has certain medical conditions should be immunized. Any person who resides in a nursing home or long-term care facility should be immunized. An adult smoker should be immunized. People with an immunocompromised condition and certain other conditions should receive both PCV13 and PPSV23 vaccines. People with human immunodeficiency virus (HIV) infection should be immunized as soon as possible after diagnosis. Immunization during chemotherapy or radiation therapy should be avoided. Routine use of  PPSV23 vaccine is not recommended for American Indians, [place] Natives, or people younger than 65 years unless there are medical conditions that require PPSV23 vaccine. When indicated, people who have unknown immunization and have no record of immunization should receive PPSV23 vaccine. One-time revaccination 5 years after the first dose of PPSV23 is recommended for people aged 12 64 years who have chronic kidney failure, nephrotic syndrome, asplenia, or immunocompromised conditions. People who received 1 2 doses of PPSV23 before age 61 years should receive another dose of PPSV23 vaccine at age 55 years or later if at least 5 years have passed since the previous dose. Doses of PPSV23 are not needed for people immunized with PPSV23 at or after age 43 years.  Meningococcal vaccine. Adults with asplenia or persistent complement component deficiencies should receive 2 doses of quadrivalent meningococcal conjugate (MenACWY-D) vaccine. The doses should be obtained at least 2 months apart. Microbiologists working with certain meningococcal bacteria, [place] recruits, people at risk during an outbreak, and people who travel to or live in countries with a high rate of meningitis should be immunized. A first-year college student up through age 90 years who is living in a residence hall should receive a dose if she did not receive a dose on or after her 16th birthday. Adults who have certain high-risk conditions should receive one or more doses of vaccine.  Hepatitis A  vaccine. Adults who wish to be protected from this disease, have certain high-risk conditions, work with hepatitis A-infected animals, work in hepatitis A research labs, or travel to or work in countries with a high rate of hepatitis A should be immunized. Adults who were previously unvaccinated and who anticipate close contact with an international adoptee during the first 60 days after arrival in the [place] from a country with a high rate of  hepatitis A should be immunized.  Hepatitis B vaccine.  Adults who wish to be protected from this disease, have certain high-risk conditions, may be exposed to blood or other infectious body fluids, are household contacts or sex partners of hepatitis B positive people, are clients or workers in certain care facilities, or travel to or work in countries with a high rate of hepatitis B should be immunized.  Haemophilus influenzae type b (Hib) vaccine. A previously unvaccinated person with asplenia or sickle cell disease or having a scheduled splenectomy should receive 1 dose of Hib vaccine. Regardless of previous immunization, a recipient of a hematopoietic stem cell transplant should receive a 3-dose series 6 12 months after her successful transplant. Hib vaccine is not recommended for adults with HIV infection.  Preventive Services / Frequency Ages 58 to 39years  Blood pressure check.** / Every 1 to 2 years.  Lipid and cholesterol check.** / Every 5 years beginning at age 27.  Clinical breast exam.** / Every 3 years for women in their 35s and 86s.  BRCA-related cancer risk assessment.** / For women who have family members with a BRCA-related cancer (breast, ovarian, tubal, or peritoneal cancers).  Pap test.** / Every 2 years from ages 65 through 68. Every 3 years starting at age 28 through age 22 or 77 with a history of 3 consecutive normal Pap tests.  HPV screening.** / Every 3 years from ages 58 through ages 24 to 36 with a history of 3 consecutive normal Pap tests.  Hepatitis C blood test.** / For any individual with known risks for hepatitis C.  Skin self-exam. / Monthly.  Influenza vaccine. / Every year.  Tetanus, diphtheria, and acellular pertussis (Tdap, Td) vaccine.** / Consult your health care provider. Pregnant women should receive 1 dose of Tdap vaccine during each pregnancy. 1 dose of Td every 10 years.  Varicella vaccine.** / Consult your health care provider. Pregnant  females who do not have evidence of immunity should receive the first dose after pregnancy.  HPV vaccine. / 3 doses over 6 months, if 31 and younger. The vaccine is not recommended for use in pregnant females. However, pregnancy testing is not needed before receiving a dose.  Measles, mumps, rubella (MMR) vaccine.** / You need at least 1 dose of MMR if you were born in 1957 or later. You may also need a 2nd dose. For females of childbearing age, rubella immunity should be determined. If there is no evidence of immunity, females who are not pregnant should be vaccinated. If there is no evidence of immunity, females who are pregnant should delay immunization until after pregnancy.  Pneumococcal 13-valent conjugate (PCV13) vaccine.** / Consult your health care provider.  Pneumococcal polysaccharide (PPSV23) vaccine.** / 1 to 2 doses if you smoke cigarettes or if you have certain conditions.  Meningococcal vaccine.** / 1 dose if you are age 65 to 47 years and a Market researcher living in a residence hall, or have one of several medical conditions, you need to get vaccinated against meningococcal disease. You may also need  additional booster doses.  Hepatitis A vaccine.** / Consult your health care provider.  Hepatitis B vaccine.** / Consult your health care provider.  Haemophilus influenzae type b (Hib) vaccine.** / Consult your health care provider.  Ages 66 to 64years  Blood pressure check.** / Every 1 to 2 years.  Lipid and cholesterol check.** / Every 5 years beginning at age 40 years.  Lung cancer screening. / Every year if you are aged 21 80 years and have a 30-pack-year history of smoking and currently smoke or have quit within the past 15 years. Yearly screening is stopped once you have quit smoking for at least 15 years or develop a health problem that would prevent you from having lung cancer treatment.  Clinical breast exam.** / Every year after age 91  years.  BRCA-related cancer risk assessment.** / For women who have family members with a BRCA-related cancer (breast, ovarian, tubal, or peritoneal cancers).  Mammogram.** / Every year beginning at age 15 years and continuing for as long as you are in good health. Consult with your health care provider.  Pap test.** / Every 3 years starting at age 74 years through age 31 or 30 years with a history of 3 consecutive normal Pap tests.  HPV screening.** / Every 3 years from ages 27 years through ages 83 to 44 years with a history of 3 consecutive normal Pap tests.  Fecal occult blood test (FOBT) of stool. / Every year beginning at age 78 years and continuing until age 18 years. You may not need to do this test if you get a colonoscopy every 10 years.  Flexible sigmoidoscopy or colonoscopy.** / Every 5 years for a flexible sigmoidoscopy or every 10 years for a colonoscopy beginning at age 59 years and continuing until age 65 years.  Hepatitis C blood test.** / For all people born from [birth year] through 1963 and any individual with known risks for hepatitis C.  Skin self-exam. / Monthly.  Influenza vaccine. / Every year.  Tetanus, diphtheria, and acellular pertussis (Tdap/Td) vaccine.** / Consult your health care provider. Pregnant women should receive 1 dose of Tdap vaccine during each pregnancy. 1 dose of Td every 10 years.  Varicella vaccine.** / Consult your health care provider. Pregnant females who do not have evidence of immunity should receive the first dose after pregnancy.  Zoster vaccine.** / 1 dose for adults aged 51 years or older.  Measles, mumps, rubella (MMR) vaccine.** / You need at least 1 dose of MMR if you were born in 1957 or later. You may also need a 2nd dose. For females of childbearing age, rubella immunity should be determined. If there is no evidence of immunity, females who are not pregnant should be vaccinated. If there is no evidence of immunity, females who are  pregnant should delay immunization until after pregnancy.  Pneumococcal 13-valent conjugate (PCV13) vaccine.** / Consult your health care provider.  Pneumococcal polysaccharide (PPSV23) vaccine.** / 1 to 2 doses if you smoke cigarettes or if you have certain conditions.  Meningococcal vaccine.** / Consult your health care provider.  Hepatitis A vaccine.** / Consult your health care provider.  Hepatitis B vaccine.** / Consult your health care provider.  Haemophilus influenzae type b (Hib) vaccine.** / Consult your health care provider.  Ages 44 years and over  Blood pressure check.** / Every 1 to 2 years.  Lipid and cholesterol check.** / Every 5 years beginning at age 4 years.  Lung cancer screening. / Every year if you are  aged 23 80 years and have a 30-pack-year history of smoking and currently smoke or have quit within the past 15 years. Yearly screening is stopped once you have quit smoking for at least 15 years or develop a health problem that would prevent you from having lung cancer treatment.  Clinical breast exam.** / Every year after age 32 years.  BRCA-related cancer risk assessment.** / For women who have family members with a BRCA-related cancer (breast, ovarian, tubal, or peritoneal cancers).  Mammogram.** / Every year beginning at age 60 years and continuing for as long as you are in good health. Consult with your health care provider.  Pap test.** / Every 3 years starting at age 53 years through age 47 or 2 years with 3 consecutive normal Pap tests. Testing can be stopped between 65 and 70 years with 3 consecutive normal Pap tests and no abnormal Pap or HPV tests in the past 10 years.  HPV screening.** / Every 3 years from ages 66 years through ages 68 or 40 years with a history of 3 consecutive normal Pap tests. Testing can be stopped between 65 and 70 years with 3 consecutive normal Pap tests and no abnormal Pap or HPV tests in the past 10 years.  Fecal occult  blood test (FOBT) of stool. / Every year beginning at age 68 years and continuing until age 21 years. You may not need to do this test if you get a colonoscopy every 10 years.  Flexible sigmoidoscopy or colonoscopy.** / Every 5 years for a flexible sigmoidoscopy or every 10 years for a colonoscopy beginning at age 79 years and continuing until age 90 years.  Hepatitis C blood test.** / For all people born from [birth year] through 1963 and any individual with known risks for hepatitis C.  Osteoporosis screening.** / A one-time screening for women ages 60 years and over and women at risk for fractures or osteoporosis.  Skin self-exam. / Monthly.  Influenza vaccine. / Every year.  Tetanus, diphtheria, and acellular pertussis (Tdap/Td) vaccine.** / 1 dose of Td every 10 years.  Varicella vaccine.** / Consult your health care provider.  Zoster vaccine.** / 1 dose for adults aged 38 years or older.  Pneumococcal 13-valent conjugate (PCV13) vaccine.** / Consult your health care provider.  Pneumococcal polysaccharide (PPSV23) vaccine.** / 1 dose for all adults aged 34 years and older.  Meningococcal vaccine.** / Consult your health care provider.  Hepatitis A vaccine.** / Consult your health care provider.  Hepatitis B vaccine.** / Consult your health care provider.  Haemophilus influenzae type b (Hib) vaccine.** / Consult your health care provider. ** Family history and personal history of risk and conditions may change your health care provider's recommendations. Document Released: 03/11/2001 Document Revised: 11/03/2012  [facility] Patient Information 2014 [place], [place].   EXERCISE AND DIET:  We recommended that you start or continue a regular exercise program for good health. Regular exercise means any activity that makes your heart beat faster and makes you sweat.  We recommend exercising at least 30 minutes per day at least 3 days a week, preferably 5.  We also recommend a diet low in fat  and sugar / carbohydrates.  Inactivity, poor dietary choices and obesity can cause diabetes, heart attack, stroke, and kidney damage, among others.     ALCOHOL AND SMOKING:  Women should limit their alcohol intake to no more than 7 drinks/beers/glasses of wine (combined, not each!) per week. Moderation of alcohol intake to this level decreases your risk  of breast cancer and liver damage.  ( And of course, no recreational drugs are part of a healthy lifestyle.)  Also, you should not be smoking at all or even being exposed to second hand smoke. Most people know smoking can cause cancer, and various heart and lung diseases, but did you know it also contributes to weakening of your bones?  Aging of your skin?  Yellowing of your teeth and nails?   CALCIUM AND VITAMIN D:  Adequate intake of calcium and Vitamin D are recommended.  The recommendations for exact amounts of these supplements seem to change often, but generally speaking 600 mg of calcium (either carbonate or citrate) and 800 units of Vitamin D per day seems prudent. Certain women may benefit from higher intake of Vitamin D.  If you are among these women, your doctor will have told you during your visit.     PAP SMEARS:  Pap smears, to check for cervical cancer or precancers,  have traditionally been done yearly, although recent scientific advances have shown that most women can have pap smears less often.  However, every woman still should have a physical exam from her gynecologist or primary care physician every year. It will include a breast check, inspection of the vulva and vagina to check for abnormal growths or skin changes, a visual exam of the cervix, and then an exam to evaluate the size and shape of the uterus and ovaries.  And after 36 years of age, a rectal exam is indicated to check for rectal cancers. We will also provide age appropriate advice regarding health maintenance, like when you should have certain vaccines, screening for  sexually transmitted diseases, bone density testing, colonoscopy, mammograms, etc.    MAMMOGRAMS:  All women over 62 years old should have a yearly mammogram. Many facilities now offer a "3D" mammogram, which may cost around $50 extra out of pocket. If possible,  we recommend you accept the option to have the 3D mammogram performed.  It both reduces the number of women who will be called back for extra views which then turn out to be normal, and it is better than the routine mammogram at detecting truly abnormal areas.     COLONOSCOPY:  Colonoscopy to screen for colon cancer is recommended for all women at age 65.  We know, you hate the idea of the prep.  We agree, BUT, having colon cancer and not knowing it is worse!!  Colon cancer so often starts as a polyp that can be seen and removed at colonscopy, which can quite literally save your life!  And if your first colonoscopy is normal and you have no family history of colon cancer, most women don't have to have it again for 10 years.  Once every ten years, you can do something that may end up saving your life, right?  We will be happy to help you get it scheduled when you are ready.  Be sure to check your insurance coverage so you understand how much it will cost.  It may be covered as a preventative service at no cost, but you should check your particular policy.

## 2019-01-27 ENCOUNTER — Other Ambulatory Visit: Payer: BC Managed Care – PPO

## 2019-01-27 DIAGNOSIS — R946 Abnormal results of thyroid function studies: Secondary | ICD-10-CM | POA: Diagnosis not present

## 2019-01-27 LAB — COMPREHENSIVE METABOLIC PANEL
ALT: 10 IU/L (ref 0–32)
AST: 15 IU/L (ref 0–40)
Albumin/Globulin Ratio: 1.7 (ref 1.2–2.2)
Albumin: 4.3 g/dL (ref 3.8–4.8)
Alkaline Phosphatase: 54 IU/L (ref 39–117)
BUN/Creatinine Ratio: 13 (ref 9–23)
BUN: 10 mg/dL (ref 6–20)
Bilirubin Total: 0.4 mg/dL (ref 0.0–1.2)
CO2: 25 mmol/L (ref 20–29)
Calcium: 9.4 mg/dL (ref 8.7–10.2)
Chloride: 103 mmol/L (ref 96–106)
Creatinine, Ser: 0.76 mg/dL (ref 0.57–1.00)
GFR calc Af Amer: 117 mL/min/{1.73_m2} (ref >59)
GFR calc non Af Amer: 101 mL/min/{1.73_m2} (ref >59)
Globulin, Total: 2.6 g/dL (ref 1.5–4.5)
Glucose: 89 mg/dL (ref 65–99)
Potassium: 4.5 mmol/L (ref 3.5–5.2)
Sodium: 140 mmol/L (ref 134–144)
Total Protein: 6.9 g/dL (ref 6.0–8.5)

## 2019-01-27 LAB — CBC WITH DIFFERENTIAL/PLATELET
Basophils Absolute: 0 10*3/uL (ref 0.0–0.2)
Basos: 1 %
EOS (ABSOLUTE): 0.1 10*3/uL (ref 0.0–0.4)
Eos: 3 %
Hematocrit: 39.7 % (ref 34.0–46.6)
Hemoglobin: 13.1 g/dL (ref 11.1–15.9)
Immature Grans (Abs): 0 10*3/uL (ref 0.0–0.1)
Immature Granulocytes: 0 %
Lymphocytes Absolute: 1.6 10*3/uL (ref 0.7–3.1)
Lymphs: 28 %
MCH: 28.2 pg (ref 26.6–33.0)
MCHC: 33 g/dL (ref 31.5–35.7)
MCV: 86 fL (ref 79–97)
Monocytes Absolute: 0.6 10*3/uL (ref 0.1–0.9)
Monocytes: 11 %
Neutrophils Absolute: 3.3 10*3/uL (ref 1.4–7.0)
Neutrophils: 57 %
Platelets: 221 10*3/uL (ref 150–450)
RBC: 4.64 x10E6/uL (ref 3.77–5.28)
RDW: 12.2 % (ref 11.7–15.4)
WBC: 5.7 10*3/uL (ref 3.4–10.8)

## 2019-01-27 LAB — LIPID PANEL
Chol/HDL Ratio: 2.2 ratio (ref 0.0–4.4)
Cholesterol, Total: 139 mg/dL (ref 100–199)
HDL: 62 mg/dL (ref >39)
LDL Chol Calc (NIH): 67 mg/dL (ref 0–99)
Triglycerides: 45 mg/dL (ref 0–149)
VLDL Cholesterol Cal: 10 mg/dL (ref 5–40)

## 2019-01-27 LAB — VITAMIN D 25 HYDROXY (VIT D DEFICIENCY, FRACTURES): Vit D, 25-Hydroxy: 24.1 ng/mL — ABNORMAL LOW (ref 30.0–100.0)

## 2019-01-27 LAB — HEMOGLOBIN A1C
Est. average glucose Bld gHb Est-mCnc: 97 mg/dL
Hgb A1c MFr Bld: 5 % (ref 4.8–5.6)

## 2019-01-27 LAB — TSH: TSH: 1.34 u[IU]/mL (ref 0.450–4.500)

## 2019-01-27 LAB — T4, FREE: Free T4: 1.33 ng/dL (ref 0.82–1.77)

## 2019-02-01 LAB — THYROID STIMULATING IMMUNOGLOBULIN: Thyroid Stim Immunoglobulin: <0.1 IU/L (ref 0.00–0.55)

## 2019-02-01 LAB — THYROGLOBULIN ANTIBODY: Thyroglobulin Antibody: <1 IU/mL (ref 0.0–0.9)

## 2019-02-02 ENCOUNTER — Telehealth: Payer: Self-pay | Admitting: Family Medicine

## 2019-02-02 MED ORDER — VITAMIN D (ERGOCALCIFEROL) 1.25 MG (50000 UNIT) PO CAPS: 50000.0000 [IU] | ORAL_CAPSULE | ORAL | 3 refills | Status: DC

## 2019-02-02 NOTE — Telephone Encounter (Signed)
-----   Message from Thomasene Lot, DO sent at 02/02/2019  6:28 AM EST ----- [facility] note sent, N labs;  Vit D low- please send in script- 50k IU 1 po q wk, disp 12, 1 RF.  TY

## 2019-02-02 NOTE — Telephone Encounter (Signed)
Patient thought she was to have an endo referral placed, and called to check status of referral. I see no referral placed though. If this patient needs this service please place referral for Joselyn Glassman, thanks!

## 2019-02-02 NOTE — Telephone Encounter (Signed)
RX sent to pharmacy as requested below. AS, CMA

## 2019-02-02 NOTE — Telephone Encounter (Signed)
Per Dr. Sharee Holster there was no one she was going to refer her to at this time. She was advised to follow up with her Rheumatologist. Patient should schedule follow up visit via doxy or telehealth to discuss labs and any further concerns.  Can you please contact patient to schedule apt.  AS, CMA

## 2019-04-07 ENCOUNTER — Other Ambulatory Visit: Payer: Self-pay | Admitting: Certified Nurse Midwife

## 2019-04-07 DIAGNOSIS — B009 Herpesviral infection, unspecified: Secondary | ICD-10-CM

## 2019-04-07 DIAGNOSIS — N3010 Interstitial cystitis (chronic) without hematuria: Secondary | ICD-10-CM

## 2019-04-07 MED ORDER — NITROFURANTOIN MONOHYD MACRO 100 MG PO CAPS: ORAL_CAPSULE | ORAL | 4 refills | Status: DC

## 2019-04-07 NOTE — Telephone Encounter (Signed)
Med refill request: macrobid for post coital Last AEX: 12/20/2018 Next AEX:12/21/2019 Last MMG (if hormonal med) n/a Refill authorized: #[id number], 4RF pended if approved.

## 2019-04-07 NOTE — Telephone Encounter (Signed)
Patient is calling to request a refill for Macrobid and Valtrex. Patient confirmed pharmacy on file as [facility] [address] in [place].

## 2019-04-15 ENCOUNTER — Encounter: Payer: Self-pay | Admitting: Certified Nurse Midwife

## 2019-08-03 DIAGNOSIS — M25551 Pain in right hip: Secondary | ICD-10-CM | POA: Diagnosis not present

## 2019-08-03 DIAGNOSIS — M25561 Pain in right knee: Secondary | ICD-10-CM | POA: Diagnosis not present

## 2019-08-16 DIAGNOSIS — L723 Sebaceous cyst: Secondary | ICD-10-CM | POA: Diagnosis not present

## 2019-08-16 DIAGNOSIS — L089 Local infection of the skin and subcutaneous tissue, unspecified: Secondary | ICD-10-CM | POA: Diagnosis not present

## 2019-09-07 ENCOUNTER — Telehealth: Payer: Self-pay

## 2019-09-07 ENCOUNTER — Encounter: Payer: Self-pay | Admitting: Obstetrics and Gynecology

## 2019-09-07 ENCOUNTER — Other Ambulatory Visit: Payer: Self-pay

## 2019-09-07 ENCOUNTER — Ambulatory Visit: Payer: BC Managed Care – PPO | Admitting: Obstetrics and Gynecology

## 2019-09-07 VITALS — BP 120/72 | HR 90 | Ht 64.5 in | Wt 164.0 lb

## 2019-09-07 DIAGNOSIS — R5383 Other fatigue: Secondary | ICD-10-CM | POA: Diagnosis not present

## 2019-09-07 DIAGNOSIS — N943 Premenstrual tension syndrome: Secondary | ICD-10-CM

## 2019-09-07 DIAGNOSIS — N920 Excessive and frequent menstruation with regular cycle: Secondary | ICD-10-CM | POA: Diagnosis not present

## 2019-09-07 DIAGNOSIS — L0292 Furuncle, unspecified: Secondary | ICD-10-CM

## 2019-09-07 DIAGNOSIS — N946 Dysmenorrhea, unspecified: Secondary | ICD-10-CM

## 2019-09-07 MED ORDER — AMOXICILLIN-POT CLAVULANATE 875-125 MG PO TABS
1.0000 | ORAL_TABLET | Freq: Two times a day (BID) | ORAL | 0 refills | Status: DC
Start: 2019-09-07 — End: 2020-03-08

## 2019-09-07 MED ORDER — CITALOPRAM HYDROBROMIDE 20 MG PO TABS: ORAL_TABLET | ORAL | 1 refills | Status: DC

## 2019-09-07 NOTE — Telephone Encounter (Signed)
AEX 11/2018 with DL, next scheduled with JJ on 02/2020. H/o IC H/o BTL H/o anemia and vit D def.  H/o HSV 1 H/o UTI, takes Macrobid post coital  Spoke with pt. Pt states having heavy bleeding with last cycle in July. Pt states had to change a pad every 1 hour or less and had clots " more than a golf ball size"  Pt states feeling dizzy at times and having constant headaches. States also having night sweats, hot flashes, 20-25 lb weight gain,  cycles not consistent every month. The last cycle was 31 days apart. LMP 7/10 and 8/11. All these symptoms have been going on for last 5 months. States mother and sister had early hysterectomies due to same sx.   Pt also concerned with having intermittent "knots" under Left arm/breast area. Pt states has been going on the past month. Was seen at [facility] and given abx, breast knots went away, but are now back. Pt did have Covid vaccine series and in left arm. Last covid vaccine was 2-3 months ago, will bring covid vaccine card to visit to update records.   Pt advised to be seen for OV for further evaluation and possible labs. Pt agreeable. Pt scheduled with Dr Oscar La on 8/11 at 230 pm. Pt agreeable and verbalized understanding of date and time of appt. Pt thankful for call.  Encounter closed.

## 2019-09-07 NOTE — Patient Instructions (Signed)
Skin Abscess  A skin abscess is an infected area on or under your skin that contains a collection of pus and other material. An abscess may also be called a furuncle, carbuncle, or boil. An abscess can occur in or on almost any part of your body. Some abscesses break open (rupture) on their own. Most continue to get worse unless they are treated. The infection can spread deeper into the body and eventually into your blood, which can make you feel ill. Treatment usually involves draining the abscess. What are the causes? An abscess occurs when germs, like bacteria, pass through your skin and cause an infection. This may be caused by:  A scrape or cut on your skin.  A puncture wound through your skin, including a needle injection or insect bite.  Blocked oil or sweat glands.  Blocked and infected hair follicles.  A cyst that forms beneath your skin (sebaceous cyst) and becomes infected. What increases the risk? This condition is more likely to develop in people who:  Have a weak body defense system (immune system).  Have diabetes.  Have dry and irritated skin.  Get frequent injections or use illegal IV drugs.  Have a foreign body in a wound, such as a splinter.  Have problems with their lymph system or veins. What are the signs or symptoms? Symptoms of this condition include:  A painful, firm bump under the skin.  A bump with pus at the top. This may break through the skin and drain. Other symptoms include:  Redness surrounding the abscess site.  Warmth.  Swelling of the lymph nodes (glands) near the abscess.  Tenderness.  A sore on the skin. How is this diagnosed? This condition may be diagnosed based on:  A physical exam.  Your medical history.  A sample of pus. This may be used to find out what is causing the infection.  Blood tests.  Imaging tests, such as an ultrasound, CT scan, or MRI. How is this treated? A small abscess that drains on its own may not  need treatment. Treatment for larger abscesses may include:  Moist heat or heat pack applied to the area several times a day.  A procedure to drain the abscess (incision and drainage).  Antibiotic medicines. For a severe abscess, you may first get antibiotics through an IV and then change to antibiotics by mouth. Follow these instructions at home: Medicines   Take over-the-counter and prescription medicines only as told by your health care provider.  If you were prescribed an antibiotic medicine, take it as told by your health care provider. Do not stop taking the antibiotic even if you start to feel better. Abscess care   If you have an abscess that has not drained, apply heat to the affected area. Use the heat source that your health care provider recommends, such as a moist heat pack or a heating pad. ? Place a towel between your skin and the heat source. ? Leave the heat on for 20-30 minutes. ? Remove the heat if your skin turns bright red. This is especially important if you are unable to feel pain, heat, or cold. You may have a greater risk of getting burned.  Follow instructions from your health care provider about how to take care of your abscess. Make sure you: ? Cover the abscess with a bandage (dressing). ? Change your dressing or gauze as told by your health care provider. ? Wash your hands with soap and water before you change the  dressing or gauze. If soap and water are not available, use hand sanitizer.  Check your abscess every day for signs of a worsening infection. Check for: ? More redness, swelling, or pain. ? More fluid or blood. ? Warmth. ? More pus or a bad smell. General instructions  To avoid spreading the infection: ? Do not share personal care items, towels, or hot tubs with others. ? Avoid making skin contact with other people.  Keep all follow-up visits as told by your health care provider. This is important. Contact a health care provider if you  have:  More redness, swelling, or pain around your abscess.  More fluid or blood coming from your abscess.  Warm skin around your abscess.  More pus or a bad smell coming from your abscess.  A fever.  Muscle aches.  Chills or a general ill feeling. Get help right away if you:  Have severe pain.  See red streaks on your skin spreading away from the abscess. Summary  A skin abscess is an infected area on or under your skin that contains a collection of pus and other material.  A small abscess that drains on its own may not need treatment.  Treatment for larger abscesses may include having a procedure to drain the abscess and taking an antibiotic. This information is not intended to replace advice given to you by your health care provider. Make sure you discuss any questions you have with your health care provider. Document Revised: 05/06/2018 Document Reviewed: 02/26/2017 Else[employer] Patient Education  2020 [employer] Premenstrual Syndrome Premenstrual syndrome (PMS) is a group of physical, emotional, and behavioral symptoms that affect women of childbearing age as part of their menstrual cycle. PMS starts 1-2 weeks before the start of a woman's menstrual period and goes away a few days after menstrual bleeding starts. It often happens in a predictable pattern (recurs). PMS may cause other health conditions to become worse, such as asthma, allergies, and migraines. PMS can range from mild to severe. When it is severe, it is called premenstrual dysphoric disorder (PMDD). PMS may interfere with normal daily activities. What are the causes? The cause of this condition is not known, but it seems to be related to hormone changes that happen before menstruation. What are the signs or symptoms? Symptoms of this condition often happen every month. They go away completely after your period starts. Physical symptoms of this condition include:  Bloating.  Breast  pain.  Headaches.  Extreme fatigue.  Backaches.  Swelling of the hands and feet.  Weight gain.  Hot flashes. Emotional and behavioral symptoms of this condition include:  Mood swings.  Depression.  Angry outbursts.  Irritability.  Anxiety.  Crying spells.  Food cravings or appetite changes.  Changes in sexual desire.  Confusion.  Aggression.  Social withdrawal.  Poor concentration. How is this diagnosed? This condition may be diagnosed based on a history of your symptoms. This condition is generally diagnosed if symptoms of PMS:  Are present in the 5 days before your period starts.  End within 4 days after your period starts.  Happen at least 3 months in a row.  Interfere with some of your normal activities. Other conditions that can cause some of these symptoms must be ruled out before PMS can be diagnosed. These include depression, anxiety, anemia, and thyroid problems. How is this treated? This condition may be treated by:  Maintaining a healthy lifestyle. This includes eating a well-balanced diet and exercising regularly.  Taking medicines.  Medicines can help relieve symptoms such as cramps, aches, pains, headaches, and breast tenderness. Depending on the severity of the condition, your health care provider may recommend various over-the-counter pain medicines. Follow these instructions at home: Eating and drinking   Eat a well-balanced diet.  Avoid caffeine and alcohol.  Limit the amount of salt and salty foods you eat. This will help reduce bloating.  Drink enough fluid to keep your urine pale yellow.  Take a multivitamin if told to do so by your health care provider. Lifestyle   Do not use any products that contain nicotine or tobacco, such as cigarettes, e-cigarettes, and chewing tobacco. If you need help quitting, ask your health care provider.  Exercise regularly as suggested by your health care provider.  Get enough sleep. For most  adults, this is 7-8 hours of sleep each night.  Practice relaxation techniques such as yoga, tai chi, or meditation.  Find healthy ways to manage stress. General instructions   For 2-3 months, write down your symptoms, their severity, and how long they last. This will help your health care provider choose the best treatment for you.  Take over-the-counter and prescription medicines only as told by your health care provider.  If you are using birth control pills (oral contraceptives), use them as told by your health care provider. Contact a health care provider if:  Your symptoms get worse.  You develop new symptoms.  You have trouble doing your daily activities. Summary  Premenstrual syndrome (PMS) is a group of physical, emotional, and behavioral symptoms that affect women of childbearing age.  PMS starts 1-2 weeks before the start of a woman's period and goes away a few days after the period starts.  PMS is treated by maintaining a healthy lifestyle and taking medicines to relieve the symptoms. This information is not intended to replace advice given to you by your health care provider. Make sure you discuss any questions you have with your health care provider. Document Revised: 08/26/2017 Document Reviewed: 08/26/2017 Elsevier Patient Education  2020 [employer]. Menorrhagia  Menorrhagia is a condition in which menstrual periods are heavy or last longer than normal. With menorrhagia, most periods a woman has may cause enough blood loss and cramping that she becomes unable to take part in her usual activities. What are the causes? Common causes of this condition include:  Noncancerous growths in the uterus (polyps or fibroids).  An imbalance of the estrogen and progesterone hormones.  One of the ovaries not releasing an egg during one or more months.  A problem with the thyroid gland (hypothyroid).  Side effects of having an intrauterine device (IUD).  Side effects  of some medicines, such as anti-inflammatory medicines or blood thinners.  A bleeding disorder that stops the blood from clotting normally. In some cases, the cause of this condition is not known. What are the signs or symptoms? Symptoms of this condition include:  Routinely having to change your pad or tampon every 1-2 hours because it is completely soaked.  Needing to use pads and tampons at the same time because of heavy bleeding.  Needing to wake up to change your pads or tampons during the night.  Passing blood clots larger than 1 inch (2.5 cm) in size.  Having bleeding that lasts for more than 7 days.  Having symptoms of low iron levels (anemia), such as tiredness, fatigue, or shortness of breath. How is this diagnosed? This condition may be diagnosed based on:  A physical exam.  Your symptoms and menstrual history.  Tests, such as: ? Blood tests to check if you are pregnant or have hormonal changes, a bleeding or thyroid disorder, anemia, or other problems. ? Pap test to check for cancerous changes, infections, or inflammation. ? Endometrial biopsy. This test involves removing a tissue sample from the lining of the uterus (endometrium) to be examined under a microscope. ? Pelvic ultrasound. This test uses sound waves to create images of your uterus, ovaries, and vagina. The images can show if you have fibroids or other growths. ? Hysteroscopy. For this test, a small telescope is used to look inside your uterus. How is this treated? Treatment may not be needed for this condition. If it is needed, the best treatment for you will depend on:  Whether you need to prevent pregnancy.  Your desire to have children in the future.  The cause and severity of your bleeding.  Your personal preference. Medicines are the first step in treatment. You may be treated with:  Hormonal birth control methods. These treatments reduce bleeding during your menstrual period. They  include: ? Birth control pills. ? Skin patch. ? Vaginal ring. ? Shots (injections) that you get every 3 months. ? Hormonal IUD (intrauterine device). ? Implants that go under the skin.  Medicines that thicken blood and slow bleeding.  Medicines that reduce swelling, such as ibuprofen.  Medicines that contain an artificial (synthetic) hormone called progestin.  Medicines that make the ovaries stop working for a short time.  Iron supplements to treat anemia. If medicines do not work, surgery may be done. Surgical options may include:  Dilation and curettage (D&C). In this procedure, your health care provider opens (dilates) your cervix and then scrapes or suctions tissue from the endometrium to reduce menstrual bleeding.  Operative hysteroscopy. In this procedure, a small tube with a light on the end (hysteroscope) is used to view your uterus and help remove polyps that may be causing heavy periods.  Endometrial ablation. This is when various techniques are used to permanently destroy your entire endometrium. After endometrial ablation, most women have little or no menstrual flow. This procedure reduces your ability to become pregnant.  Endometrial resection. In this procedure, an electrosurgical wire loop is used to remove the endometrium. This procedure reduces your ability to become pregnant.  Hysterectomy. This is surgical removal of the uterus. This is a permanent procedure that stops menstrual periods. Pregnancy is not possible after a hysterectomy. Follow these instructions at home: Medicines  Take over-the-counter and prescription medicines exactly as told by your health care provider. This includes iron pills.  Do not change or switch medicines without asking your health care provider.  Do not take aspirin or medicines that contain aspirin 1 week before or during your menstrual period. Aspirin may make bleeding worse. General instructions  If you need to change your  sanitary pad or tampon more than once every 2 hours, limit your activity until the bleeding stops.  Iron pills can cause constipation. To prevent or treat constipation while you are taking prescription iron supplements, your health care provider may recommend that you: ? Drink enough fluid to keep your urine clear or pale yellow. ? Take over-the-counter or prescription medicines. ? Eat foods that are high in fiber, such as fresh fruits and vegetables, whole grains, and beans. ? Limit foods that are high in fat and processed sugars, such as fried and sweet foods.  Eat well-balanced meals, including foods that are high in iron. Foods that  have a lot of iron include leafy green vegetables, meat, liver, eggs, and whole grain breads and cereals.  Do not try to lose weight until the abnormal bleeding has stopped and your blood iron level is back to normal. If you need to lose weight, work with your health care provider to lose weight safely.  Keep all follow-up visits as told by your health care provider. This is important. Contact a health care provider if:  You soak through a pad or tampon every 1 or 2 hours, and this happens every time you have a period.  You need to use pads and tampons at the same time because you are bleeding so much.  You have nausea, vomiting, diarrhea, or other problems related to medicines you are taking. Get help right away if:  You soak through more than a pad or tampon in 1 hour.  You pass clots bigger than 1 inch (2.5 cm) wide.  You feel short of breath.  You feel like your heart is beating too fast.  You feel dizzy or faint.  You feel very weak or tired. Summary  Menorrhagia is a condition in which menstrual periods are heavy or last longer than normal.  Treatment will depend on the cause of the condition and may include medicines or procedures.  Take over-the-counter and prescription medicines exactly as told by your health care provider. This  includes iron pills.  Get help right away if you have heavy bleeding that soaks through more than a pad or tampon in 1 hour, you are passing large clots, or you feel dizzy, faint or short of breath. This information is not intended to replace advice given to you by your health care provider. Make sure you discuss any questions you have with your health care provider. Document Revised: 04/22/2017 Document Reviewed: 01/07/2016 Else[employer] Patient Education  [place].

## 2019-09-07 NOTE — Progress Notes (Signed)
GYNECOLOGY  VISIT   HPI: 39 y.o.   Married White or Caucasian Not Hispanic or Latino  female   [phone] with Patient's last menstrual period was 09/07/2019.   here for possible menopause symptoms. She states that she has been having heavy periods since 2017 with large clots. Last months period was worse. She is having a hot and cold intolerance and night sweats. She has gained 20 lbs in the last years and what feels like constant headache.  She also has a large knot under her left arm.   Cycles are monthly, typically every 27 days, last cycle was 31 days. Bleeds for 5-7 days, saturates a pad in up to an hour. Sleeps with a diaper. She passes large clots. Cycles have been this heavy for about a year. Slowly worsening since 2017. No BTB. She has mid cycle cramping, some premenstrual and menstrual cramps (moderate to severe). Ibuprofen helps some. She c/o bad mood changes for a week prior to her cycle.  Normal CBC and TSH in 12/20.   She has had a mirena in the past, had pain after intercourse. She also sometimes has this without the mirena, not as often.   She c/o worsening exhaustion. C/o weight gain (unexplained), eats healthy. Not exercising. Has gained 25 lbs in a year.   In the past she was on a low dose OCP and was suicidal.  She tried the patch and did well when the patch was on and terrible when the patch was removed.   She has a h/o IC, sometimes difficult to distinguish from menstrual cramps.   She has been evaluated for autoimmune disorder for itching, exhaustion, joint pain. Negative evaluation.  She went to a functional doctor, told her she had 2 types as anemia. He gave her supplements which didn't help.  She was given an antibiotic for a boil under her arm a month ago, now she has a new painful lump in in the left axilla, in a different spot  GYNECOLOGIC HISTORY: Patient's last menstrual period was 09/07/2019. Contraception:Tubal ligation.  Menopausal hormone therapy: none          OB History    Gravida  2   Para  2   Term  2   Preterm      AB      Living  2     SAB      TAB      Ectopic      Multiple      Live Births  2              Patient Active Problem List   Diagnosis Date Noted  . History of total hip replacement, right 07/12/2018  . Preoperative evaluation to rule out surgical contraindication 06/22/2018  . Femoral acetabular impingement 03/05/2018  . Pain in joint of right hip 02/18/2018  . Pain in joint of left shoulder 02/18/2018  . Flank pain 12/17/2016  . Dysuria 12/17/2016  . History of depression 10/13/2016  . h/o Generalized anxiety disorder with panic attacks 10/13/2016  . Family history of psychiatric disorder 10/13/2016  . Tingling in extremities 10/13/2016  . Exercise intolerance 10/13/2016  . Dizziness, nonspecific 10/13/2016  . Chest discomfort 08/29/2016  . Cardiac murmur 05/10/2014  . Allergic rhinitis 05/10/2014  . 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 Interstitial cystitis 04/25/2014    Past Medical History:  Diagnosis Date  . Abnormal Pap smear of cervix    2014 ASCUS HPV HR+, 2015 LSIL HPV HR+  .  Depression   . GERD (gastroesophageal reflux disease)   . Heart murmur   . IBS (irritable bowel syndrome)   . Mitral valve prolapse   . MRSA (methicillin resistant Staphylococcus aureus)    infection on LT side of face, and forehead (02/2011)  . Nephrolithiasis   . Shingles   . UTI (urinary tract infection)     Past Surgical History:  Procedure Laterality Date  . CESAREAN SECTION  2007 & 2011   x2  . COLPOSCOPY  [id number]  . ESOPHAGOGASTRODUODENOSCOPY  03/25/2011   Procedure: ESOPHAGOGASTRODUODENOSCOPY (EGD);  Surgeon: Barrie Folk, MD;  Location: [facility] ENDOSCOPY;  Service: Endoscopy;  Laterality: N/A;  . SAVORY DILATION  03/25/2011   Procedure: SAVORY DILATION;  Surgeon: Barrie Folk, MD;  Location: [facility] ENDOSCOPY;  Service: Endoscopy;  Laterality: N/A;  . TOTAL HIP ARTHROPLASTY  2020   right  . TUBAL  LIGATION  2011  . WISDOM TOOTH EXTRACTION  2003    Current Outpatient Medications  Medication Sig Dispense Refill  . fexofenadine (ALLEGRA) 180 MG tablet Take 180 mg by mouth daily.    . nitrofurantoin, macrocrystal-monohydrate, (MACROBID) 100 MG capsule 1CAP POST COITAL-IF SYMPTOMS PERSIST TAKE 1CAP 2XDAY X3DAYS & CALL OFFICE IF NOT BETTER 30 capsule 4  . valACYclovir (VALTREX) 1000 MG tablet For oral HSV take two tablets twice daily x 1 day at onset of outbreak 30 tablet 12  . Vitamin D, Ergocalciferol, (DRISDOL) 1.25 MG (50000 UT) CAPS capsule Take 1 capsule (50,000 Units total) by mouth every 7 (seven) days. 12 capsule 3   No current facility-administered medications for this visit.     ALLERGIES: Other, Quinolones, and Sulfa antibiotics  Family History  Problem Relation Age of Onset  . Diabetes Mother        Type 2  . Cancer Maternal Grandmother   . Breast cancer Maternal Grandmother 50  . Cancer Maternal Grandfather   . Diabetes Maternal Grandfather        Type 1   . Lung cancer Maternal Grandfather   . Arthritis Father   . Other Sister        Agenesis of corpus collosum  . Heart attack Paternal Grandfather   . Lupus Paternal Aunt     Social History   Socioeconomic History  . Marital status: Married    Spouse name: Not on file  . Number of children: 2  . Years of education: 56  . Highest education level: Not on file  Occupational History  . Not on file  Tobacco Use  . Smoking status: Never Smoker  . Smokeless tobacco: Never Used  Vaping Use  . Vaping Use: Never used  Substance and Sexual Activity  . Alcohol use: Yes    Alcohol/week: 0.0 standard drinks    Comment: 2 a month  . Drug use: No  . Sexual activity: Yes    Partners: Male    Birth control/protection: Surgical    Comment: Tubal  Other Topics Concern  . Not on file  Social History Narrative   Lives with husband and 2 sons in a 3 story home.     Works with adults with developmental  disabilities.     Education: BS.   Social Determinants of Health   Financial Resource Strain:   . Difficulty of Paying Living Expenses:   Food Insecurity:   . Worried About Programme researcher, broadcasting/film/video in the Last Year:   . Barista in the Last Year:   [employer]  Needs:   . Lack of Transportation (Medical):   Marland Kitchen Lack of Transportation (Non-Medical):   Physical Activity:   . Days of Exercise per Week:   . Minutes of Exercise per Session:   Stress:   . Feeling of Stress :   Social Connections:   . Frequency of Communication with Friends and Family:   . Frequency of Social Gatherings with Friends and Family:   . Attends Religious Services:   . Active Member of Clubs or Organizations:   . Attends Banker Meetings:   Marland Kitchen Marital Status:   Intimate Partner Violence:   . Fear of Current or Ex-Partner:   . Emotionally Abused:   Marland Kitchen Physically Abused:   . Sexually Abused:     Review of Systems  Constitutional: Positive for chills.  Genitourinary:       Hot flashes   Heavy periods    Neurological: Positive for headaches.  All other systems reviewed and are negative.   PHYSICAL EXAMINATION:    BP 120/72   Pulse 90   Ht 5' 4.5" (1.638 m)   Wt 164 lb (74.4 kg)   LMP 09/07/2019   SpO2 98%   BMI 27.72 kg/m     General appearance: alert, cooperative and appears stated age Neck: no adenopathy, supple, symmetrical, trachea midline and thyroid normal to inspection and palpation Left axilla: boil < 1cm with some surrounding erythema Abdomen: soft, non-tender; non distended, no masses,  no organomegaly  Pelvic: External genitalia:  no lesions              Urethra:  normal appearing urethra with no masses, tenderness or lesions              Bartholins and Skenes: normal                 Vagina: normal appearing vagina with normal color and discharge, no lesions, moderate blood in the vagina              Cervix: no cervical motion tenderness and no lesions               Bimanual Exam:  Uterus:  normal size, contour, position, consistency, mobility, non-tender and anteverted              Adnexa: no mass, fullness, tenderness                Chaperone was present for exam.  ASSESSMENT Menorrhagia, worsening Dysmenorrhea PMS Boil left axilla    PLAN CBC, Ferritin, TSH Return for pelvic ultrasound She hasn't tolerated OCP's in the past, had some post coital cramping with the IUD Discussed the options of OCP's and SSRI for PMS, will start Celexa Discussed possible treatment of menorrhagia with medication or surgery. Will further discuss after her evaluation is complete. Reviewed warm compresses and hot soaks for boils, antibiotics given    An After Visit Summary was printed and given to the patient.  Over 30 minutes was spent in total patient care

## 2019-09-07 NOTE — Telephone Encounter (Signed)
Patient is calling in regards to experiencing the following symptoms: heavy and irregular periods, night sweats, hot flashes, and knots under arms and on breasts.

## 2019-09-08 LAB — CBC
Hematocrit: 39.3 % (ref 34.0–46.6)
Hemoglobin: 12.8 g/dL (ref 11.1–15.9)
MCH: 28.5 pg (ref 26.6–33.0)
MCHC: 32.6 g/dL (ref 31.5–35.7)
MCV: 88 fL (ref 79–97)
Platelets: 262 10*3/uL (ref 150–450)
RBC: 4.49 x10E6/uL (ref 3.77–5.28)
RDW: 12 % (ref 11.7–15.4)
WBC: 9.8 10*3/uL (ref 3.4–10.8)

## 2019-09-08 LAB — TSH: TSH: 0.768 u[IU]/mL (ref 0.450–4.500)

## 2019-09-08 LAB — FERRITIN: Ferritin: 41 ng/mL (ref 15–150)

## 2019-09-29 ENCOUNTER — Other Ambulatory Visit: Payer: Self-pay | Admitting: Obstetrics and Gynecology

## 2019-09-29 DIAGNOSIS — Z8659 Personal history of other mental and behavioral disorders: Secondary | ICD-10-CM

## 2019-09-29 DIAGNOSIS — R5383 Other fatigue: Secondary | ICD-10-CM | POA: Diagnosis not present

## 2019-09-29 NOTE — Telephone Encounter (Signed)
Medication refill request: Citalopram 20mg   Last AEX:  12/20/18 Next AEX: 03/08/20 Last MMG (if hormonal medication request): NA Refill authorized: 90/0    Pt requesting a 90 day refill instead of 30 day due to insurance

## 2019-11-02 DIAGNOSIS — J9801 Acute bronchospasm: Secondary | ICD-10-CM | POA: Diagnosis not present

## 2019-11-02 DIAGNOSIS — Z20822 Contact with and (suspected) exposure to covid-19: Secondary | ICD-10-CM | POA: Diagnosis not present

## 2019-12-21 ENCOUNTER — Ambulatory Visit: Payer: BC Managed Care – PPO | Admitting: Certified Nurse Midwife

## 2019-12-21 DIAGNOSIS — R0981 Nasal congestion: Secondary | ICD-10-CM | POA: Diagnosis not present

## 2019-12-21 DIAGNOSIS — Z20822 Contact with and (suspected) exposure to covid-19: Secondary | ICD-10-CM | POA: Diagnosis not present

## 2019-12-21 DIAGNOSIS — R059 Cough, unspecified: Secondary | ICD-10-CM | POA: Diagnosis not present

## 2019-12-25 DIAGNOSIS — U071 COVID-19: Secondary | ICD-10-CM | POA: Diagnosis not present

## 2019-12-27 ENCOUNTER — Other Ambulatory Visit: Payer: Self-pay

## 2019-12-27 DIAGNOSIS — B009 Herpesviral infection, unspecified: Secondary | ICD-10-CM

## 2019-12-27 MED ORDER — VALACYCLOVIR HCL 1 G PO TABS: ORAL_TABLET | ORAL | 1 refills | Status: DC

## 2019-12-27 NOTE — Telephone Encounter (Signed)
Patient is calling in regards to refill for Valtrex.

## 2019-12-27 NOTE — Telephone Encounter (Signed)
Medication refill request: valtrex 1000mg  Last AEX:  12-20-2018 Next AEX: 03-08-2020 Last MMG (if hormonal medication request): n/a Refill authorized:  Please approve until aex if appropriate

## 2019-12-29 DIAGNOSIS — U071 COVID-19: Secondary | ICD-10-CM | POA: Diagnosis not present

## 2019-12-29 DIAGNOSIS — A4901 Methicillin susceptible Staphylococcus aureus infection, unspecified site: Secondary | ICD-10-CM | POA: Diagnosis not present

## 2020-01-02 DIAGNOSIS — Z881 Allergy status to other antibiotic agents status: Secondary | ICD-10-CM | POA: Diagnosis not present

## 2020-01-02 DIAGNOSIS — J984 Other disorders of lung: Secondary | ICD-10-CM | POA: Diagnosis not present

## 2020-01-02 DIAGNOSIS — R0682 Tachypnea, not elsewhere classified: Secondary | ICD-10-CM | POA: Diagnosis not present

## 2020-01-02 DIAGNOSIS — R079 Chest pain, unspecified: Secondary | ICD-10-CM | POA: Diagnosis not present

## 2020-01-02 DIAGNOSIS — Z79899 Other long term (current) drug therapy: Secondary | ICD-10-CM | POA: Diagnosis not present

## 2020-01-02 DIAGNOSIS — Z880 Allergy status to penicillin: Secondary | ICD-10-CM | POA: Diagnosis not present

## 2020-01-02 DIAGNOSIS — X58XXXA Exposure to other specified factors, initial encounter: Secondary | ICD-10-CM | POA: Diagnosis not present

## 2020-01-02 DIAGNOSIS — Z882 Allergy status to sulfonamides status: Secondary | ICD-10-CM | POA: Diagnosis not present

## 2020-01-02 DIAGNOSIS — R0602 Shortness of breath: Secondary | ICD-10-CM | POA: Diagnosis not present

## 2020-01-02 DIAGNOSIS — R059 Cough, unspecified: Secondary | ICD-10-CM | POA: Diagnosis not present

## 2020-01-02 DIAGNOSIS — Z8616 Personal history of COVID-19: Secondary | ICD-10-CM | POA: Diagnosis not present

## 2020-01-02 DIAGNOSIS — S0180XA Unspecified open wound of other part of head, initial encounter: Secondary | ICD-10-CM | POA: Diagnosis not present

## 2020-01-02 DIAGNOSIS — R071 Chest pain on breathing: Secondary | ICD-10-CM | POA: Diagnosis not present

## 2020-03-08 ENCOUNTER — Encounter: Payer: Self-pay | Admitting: Obstetrics and Gynecology

## 2020-03-08 ENCOUNTER — Other Ambulatory Visit (HOSPITAL_COMMUNITY)
Admission: RE | Admit: 2020-03-08 | Discharge: 2020-03-08 | Disposition: A | Discharge status: Home / Self Care | Payer: BC Managed Care – PPO | Source: Ambulatory Visit | Attending: Obstetrics and Gynecology | Admitting: Obstetrics and Gynecology

## 2020-03-08 ENCOUNTER — Ambulatory Visit: Payer: BC Managed Care – PPO | Admitting: Obstetrics and Gynecology

## 2020-03-08 ENCOUNTER — Other Ambulatory Visit: Payer: Self-pay

## 2020-03-08 VITALS — BP 122/74 | HR 77 | Ht 65.0 in | Wt 170.8 lb

## 2020-03-08 DIAGNOSIS — E559 Vitamin D deficiency, unspecified: Secondary | ICD-10-CM | POA: Diagnosis not present

## 2020-03-08 DIAGNOSIS — Z124 Encounter for screening for malignant neoplasm of cervix: Secondary | ICD-10-CM

## 2020-03-08 DIAGNOSIS — Z833 Family history of diabetes mellitus: Secondary | ICD-10-CM

## 2020-03-08 DIAGNOSIS — B009 Herpesviral infection, unspecified: Secondary | ICD-10-CM

## 2020-03-08 DIAGNOSIS — Z01419 Encounter for gynecological examination (general) (routine) without abnormal findings: Secondary | ICD-10-CM

## 2020-03-08 LAB — VITAMIN D 25 HYDROXY (VIT D DEFICIENCY, FRACTURES): Vit D, 25-Hydroxy: 60 ng/mL (ref 30–100)

## 2020-03-08 MED ORDER — VALACYCLOVIR HCL 1 G PO TABS: ORAL_TABLET | ORAL | 1 refills | Status: DC

## 2020-03-08 NOTE — Patient Instructions (Signed)

## 2020-03-08 NOTE — Progress Notes (Signed)
40 y.o. G29P2002 Married White or Caucasian Not Hispanic or Latino female here for annual exam. She has a bump on the right lower labia. She noticed it 3 months ago, not tender, no change, not draining.   In 8/21 she was seen for menorrhagia. Not anemic, normal TSH and normal ferritin. U/S was ordered but not done.  Cycles have improved since then. Tolerable.  Period Cycle (Days): 28 Period Duration (Days): 5-7 Period Pattern: Regular Menstrual Flow: Moderate Menstrual Control: Maxi pad,Hospital pad Menstrual Control Change Freq (Hours): 2-3 Dysmenorrhea: (!) Moderate Dysmenorrhea Symptoms: Cramping,Diarrhea,Headache  She hasn't done well with OCP's or the mirnea IUD.  Patient's last menstrual period was 02/20/2019.          Sexually active: Yes.    The current method of family planning is tubal ligation.    Exercising: No.  The patient does not participate in regular exercise at present. Smoker:  no  Health Maintenance: Pap:   08-06-16 neg, 11-18-17 neg History of abnormal Pap:  Yes, had HPV, f/u paps were okay. No surgery on her cervix.  MMG:  None  BMD:   None  Colonoscopy: none  TDaP:  2018 Gardasil: none    reports that she has never smoked. She has never used smokeless tobacco. She reports current alcohol use. She reports that she does not use drugs. Rare ETOH. Boys are 14 and 10. They both have autism, both with normal/high IQ's. Oldest has turrets, both with adhd.  Her brother has DiGeorge syndrome and lives with her, multiple issues (chromosomal abnormalities).   She works with adults with developmental disabilities, she is a Interior and spatial designer.  Past Medical History:  Diagnosis Date  . Abnormal Pap smear of cervix    2014 ASCUS HPV HR+, 2015 LSIL HPV HR+  . Depression   . GERD (gastroesophageal reflux disease)   . Heart murmur   . IBS (irritable bowel syndrome)   . Mitral valve prolapse   . MRSA (methicillin resistant Staphylococcus aureus)    infection on LT side of face, and  forehead (02/2011)  . Nephrolithiasis   . Shingles   . UTI (urinary tract infection)     Past Surgical History:  Procedure Laterality Date  . CESAREAN SECTION  2007 & 2011   x2  . COLPOSCOPY  [id number]  . ESOPHAGOGASTRODUODENOSCOPY  03/25/2011   Procedure: ESOPHAGOGASTRODUODENOSCOPY (EGD);  Surgeon: Barrie Folk, MD;  Location: [facility] ENDOSCOPY;  Service: Endoscopy;  Laterality: N/A;  . SAVORY DILATION  03/25/2011   Procedure: SAVORY DILATION;  Surgeon: Barrie Folk, MD;  Location: [facility] ENDOSCOPY;  Service: Endoscopy;  Laterality: N/A;  . TOTAL HIP ARTHROPLASTY  2020   right  . TUBAL LIGATION  2011  . WISDOM TOOTH EXTRACTION  2003    Current Outpatient Medications  Medication Sig Dispense Refill  . fexofenadine (ALLEGRA) 180 MG tablet Take 180 mg by mouth daily.    . valACYclovir (VALTREX) 1000 MG tablet For oral HSV take two tablets twice daily x 1 day at onset of outbreak 30 tablet 1  . Vitamin D, Ergocalciferol, (DRISDOL) 1.25 MG (50000 UNIT) CAPS capsule Take 50,000 Units by mouth every 7 (seven) days.     No current facility-administered medications for this visit.    Family History  Problem Relation Age of Onset  . Diabetes Mother        Type 2  . Cancer Maternal Grandmother   . Breast cancer Maternal Grandmother 50  . Cancer Maternal Grandfather   . Diabetes  Maternal Grandfather        Type 1   . Lung cancer Maternal Grandfather   . Arthritis Father   . Other Sister        Agenesis of corpus collosum  . Heart attack Paternal Grandfather   . Lupus Paternal Aunt     Review of Systems  All other systems reviewed and are negative.   Exam:   BP 122/74   Pulse 77   Ht 5\' 5"  (1.651 m)   Wt 170 lb 12.8 oz (77.5 kg)   LMP 02/20/2019   SpO2 99%   BMI 28.42 kg/m   Weight change: @WEIGHTCHANGE @ Height:   Height: 5\' 5"  (165.1 cm)  Ht Readings from Last 3 Encounters:  03/08/20 5\' 5"  (1.651 m)  09/07/19 5' 4.5" (1.638 m)  01/26/19 5\' 5"  (1.651 m)    General  appearance: alert, cooperative and appears stated age Head: Normocephalic, without obvious abnormality, atraumatic Neck: no adenopathy, supple, symmetrical, trachea midline and thyroid normal to inspection and palpation Lungs: clear to auscultation bilaterally Cardiovascular: regular rate and rhythm Breasts: normal appearance, no masses or tenderness Abdomen: soft, non-tender; non distended,  no masses,  no organomegaly Extremities: extremities normal, atraumatic, no cyanosis or edema Skin: Skin color, texture, turgor normal. No rashes or lesions Lymph nodes: Cervical, supraclavicular, and axillary nodes normal. No abnormal inguinal nodes palpated Neurologic: Grossly normal   Pelvic: External genitalia:  no lesions seen, patient also unable to find the lump today.              Urethra:  normal appearing urethra with no masses, tenderness or lesions              Bartholins and Skenes: normal                 Vagina: normal appearing vagina with normal color and discharge, no lesions              Cervix: no lesions               Bimanual Exam:  Uterus:  normal size, contour, position, consistency, mobility, non-tender              Adnexa: no mass, fullness, tenderness               Rectovaginal: Confirms               Anus:  normal sphincter tone, no lesions  chaperoned for the exam.  1. Well woman exam Discussed breast self exam Discussed calcium and vit D intake No screening labs today  2. Screening for cervical cancer  - Cytology - PAP with hpv  3. Herpes Infrequent oral outbreaks - valACYclovir (VALTREX) 1000 MG tablet; For oral HSV take two tablets twice daily x 1 day at onset of outbreak  Dispense: 30 tablet; Refill: 1  4. Vitamin D deficiency On high dose vit d - VITAMIN D 25 Hydroxy (Vit-D Deficiency, Fractures)  5. Family history of diabetes mellitus (DM)  - Hemoglobin A1c

## 2020-03-09 LAB — HEMOGLOBIN A1C
Hgb A1c MFr Bld: 5.4 % of total Hgb (ref <5.7)
Mean Plasma Glucose: 108 mg/dL
eAG (mmol/L): 6 mmol/L

## 2020-03-09 LAB — CYTOLOGY - PAP
Comment: NEGATIVE
Diagnosis: NEGATIVE
High risk HPV: NEGATIVE

## 2020-08-03 ENCOUNTER — Other Ambulatory Visit: Payer: Self-pay | Admitting: Obstetrics and Gynecology

## 2020-08-03 DIAGNOSIS — B009 Herpesviral infection, unspecified: Secondary | ICD-10-CM

## 2020-08-03 NOTE — Telephone Encounter (Signed)
Patient annual exam was on 03/08/20, Rx sent. Patient called currently having fever blister outbreak.

## 2020-08-25 ENCOUNTER — Other Ambulatory Visit: Payer: Self-pay | Admitting: Obstetrics and Gynecology

## 2020-08-25 DIAGNOSIS — B009 Herpesviral infection, unspecified: Secondary | ICD-10-CM

## 2020-09-17 ENCOUNTER — Telehealth: Payer: Self-pay

## 2020-09-17 MED ORDER — NITROFURANTOIN MONOHYD MACRO 100 MG PO CAPS: ORAL_CAPSULE | ORAL | 1 refills | Status: DC

## 2020-09-17 NOTE — Telephone Encounter (Signed)
Patient said for awhile she has had a prescription for Macrobid to take one after intercourse to prevent UTI. Her prescription expired 04/06/20 and she cannot get a refill. She received it 04/07/19 from Leota Sauers, CNM.

## 2020-09-18 NOTE — Telephone Encounter (Signed)
Patient informed. Rx sent 

## 2021-01-07 ENCOUNTER — Other Ambulatory Visit: Payer: Self-pay | Admitting: Obstetrics and Gynecology

## 2021-01-07 DIAGNOSIS — B009 Herpesviral infection, unspecified: Secondary | ICD-10-CM

## 2021-01-07 NOTE — Telephone Encounter (Signed)
Last Annual exam scheduled was on 03/08/20  Annual exam scheduled on 03/11/21

## 2021-01-18 ENCOUNTER — Other Ambulatory Visit: Payer: Self-pay | Admitting: Obstetrics and Gynecology

## 2021-01-18 DIAGNOSIS — B009 Herpesviral infection, unspecified: Secondary | ICD-10-CM

## 2021-02-25 NOTE — Progress Notes (Unsigned)
41 y.o. G48P2002 Married White or Caucasian Not Hispanic or Latino female here for annual exam.      No LMP recorded.          Sexually active: {yes no:314532}  The current method of family planning is {contraception:315051}.    Exercising: {yes no:314532}  {types:19826} Smoker:  {YES NO:22349}  Health Maintenance: Pap:03/08/20 WNL Hr HPV Neg 11-18-17 neg,   08-06-16 neg, History of abnormal Pap:  Yes, had HPV, f/u paps were okay. No surgery on her cervix.  MMG:  none  BMD:   none  Colonoscopy: none  TDaP:  08/06/16  Gardasil: none   reports that she has never smoked. She has never used smokeless tobacco. She reports current alcohol use. She reports that she does not use drugs.  Past Medical History:  Diagnosis Date   Abnormal Pap smear of cervix    2014 ASCUS HPV HR+, 2015 LSIL HPV HR+   Depression    GERD (gastroesophageal reflux disease)    Heart murmur    IBS (irritable bowel syndrome)    Mitral valve prolapse    MRSA (methicillin resistant Staphylococcus aureus)    infection on LT side of face, and forehead (02/2011)   Nephrolithiasis    Shingles    UTI (urinary tract infection)     Past Surgical History:  Procedure Laterality Date   CESAREAN SECTION  2007 & 2011   x2   COLPOSCOPY  5110,2111   ESOPHAGOGASTRODUODENOSCOPY  03/25/2011   Procedure: ESOPHAGOGASTRODUODENOSCOPY (EGD);  Surgeon: Barrie Folk, MD;  Location: [facility] ENDOSCOPY;  Service: Endoscopy;  Laterality: N/A;   SAVORY DILATION  03/25/2011   Procedure: SAVORY DILATION;  Surgeon: Barrie Folk, MD;  Location: [facility] ENDOSCOPY;  Service: Endoscopy;  Laterality: N/A;   TOTAL HIP ARTHROPLASTY  2020   right   TUBAL LIGATION  2011   WISDOM TOOTH EXTRACTION  2003    Current Outpatient Medications  Medication Sig Dispense Refill   fexofenadine (ALLEGRA) 180 MG tablet Take 180 mg by mouth daily.     nitrofurantoin, macrocrystal-monohydrate, (MACROBID) 100 MG capsule Take 1 caps post coital 30 capsule 1   valACYclovir  (VALTREX) 1000 MG tablet TAKE TWO TABLETS BY MOUTH TWICE DAILY FOR 1 DAY AT ONSET OF OUTBREAK 30 tablet 1   Vitamin D, Ergocalciferol, (DRISDOL) 1.25 MG (50000 UNIT) CAPS capsule Take 50,000 Units by mouth every 7 (seven) days.     No current facility-administered medications for this visit.    Family History  Problem Relation Age of Onset   Diabetes Mother        Type 2   Cancer Maternal Grandmother    Breast cancer Maternal Grandmother 56   Cancer Maternal Grandfather    Diabetes Maternal Grandfather        Type 1    Lung cancer Maternal Grandfather    Arthritis Father    Other Sister        Agenesis of corpus collosum   Heart attack Paternal Grandfather    Lupus Paternal Aunt     Review of Systems  Exam:   There were no vitals taken for this visit.  Weight change: @WEIGHTCHANGE @ Height:      Ht Readings from Last 3 Encounters:  03/08/20 5\' 5"  (1.651 m)  09/07/19 5' 4.5" (1.638 m)  01/26/19 5\' 5"  (1.651 m)    General appearance: alert, cooperative and appears stated age Head: Normocephalic, without obvious abnormality, atraumatic Neck: no adenopathy, supple, symmetrical, trachea midline and thyroid {  CHL AMB PHY EX THYROID NORM DEFAULT:[phone]::"normal to inspection and palpation"} Lungs: clear to auscultation bilaterally Cardiovascular: regular rate and rhythm Breasts: {Exam; breast:13139::"normal appearance, no masses or tenderness"} Abdomen: soft, non-tender; non distended,  no masses,  no organomegaly Extremities: extremities normal, atraumatic, no cyanosis or edema Skin: Skin color, texture, turgor normal. No rashes or lesions Lymph nodes: Cervical, supraclavicular, and axillary nodes normal. No abnormal inguinal nodes palpated Neurologic: Grossly normal   Pelvic: External genitalia:  no lesions              Urethra:  normal appearing urethra with no masses, tenderness or lesions              Bartholins and Skenes: normal                 Vagina: normal  appearing vagina with normal color and discharge, no lesions              Cervix: {CHL AMB PHY EX CERVIX NORM DEFAULT:[phone]::"no lesions"}               Bimanual Exam:  Uterus:  {CHL AMB PHY EX UTERUS NORM [phone]::"normal size, contour, position, consistency, mobility, non-tender"}              Adnexa: {CHL AMB PHY EX ADNEXA NO MASS DEFAULT:[phone]::"no mass, fullness, tenderness"}               Rectovaginal: Confirms               Anus:  normal sphincter tone, no lesions  *** chaperoned for the exam.  A:  Well Woman with normal exam  P:

## 2021-03-05 ENCOUNTER — Other Ambulatory Visit: Payer: Self-pay

## 2021-03-05 ENCOUNTER — Encounter (HOSPITAL_BASED_OUTPATIENT_CLINIC_OR_DEPARTMENT_OTHER): Payer: Self-pay | Admitting: Obstetrics & Gynecology

## 2021-03-05 ENCOUNTER — Other Ambulatory Visit (HOSPITAL_COMMUNITY)
Admission: RE | Admit: 2021-03-05 | Discharge: 2021-03-05 | Disposition: A | Discharge status: Home / Self Care | Payer: PRIVATE HEALTH INSURANCE | Source: Ambulatory Visit | Attending: Obstetrics & Gynecology | Admitting: Obstetrics & Gynecology

## 2021-03-05 ENCOUNTER — Other Ambulatory Visit (HOSPITAL_BASED_OUTPATIENT_CLINIC_OR_DEPARTMENT_OTHER): Payer: Self-pay | Admitting: Obstetrics & Gynecology

## 2021-03-05 ENCOUNTER — Ambulatory Visit (INDEPENDENT_AMBULATORY_CARE_PROVIDER_SITE_OTHER): Payer: PRIVATE HEALTH INSURANCE | Admitting: Obstetrics & Gynecology

## 2021-03-05 VITALS — BP 137/90 | HR 79 | Ht 65.0 in | Wt 180.4 lb

## 2021-03-05 DIAGNOSIS — Z833 Family history of diabetes mellitus: Secondary | ICD-10-CM

## 2021-03-05 DIAGNOSIS — N898 Other specified noninflammatory disorders of vagina: Secondary | ICD-10-CM

## 2021-03-05 MED ORDER — TERCONAZOLE 0.4 % VA CREA: TOPICAL_CREAM | VAGINAL | 0 refills | Status: DC

## 2021-03-05 MED ORDER — TRANEXAMIC ACID 650 MG PO TABS: ORAL_TABLET | ORAL | 2 refills | Status: DC

## 2021-03-05 MED ORDER — TERCONAZOLE 0.4 % VA CREA: 1.0000 | TOPICAL_CREAM | Freq: Every day | VAGINAL | 0 refills | Status: DC

## 2021-03-05 NOTE — Progress Notes (Signed)
GYNECOLOGY  VISIT  CC:   vulvar itching  HPI: 41 y.o. G74P2002 Married White or Caucasian female here for complaint of vaginal itching.  She reports she was on antibiotics recently and thought this might have been related.  She has used OTC monistat x 2 as well as oral fluconazole.  Reports husband has been having urinary issues and he was treated with antibiotics for a UTI but urine culture was negative.    Pt reports there has been some mild odor but unsure about actual discharge.    She does still have regular cycles.  Flow is very heavy for about three days of her cycles.  Overall, cycles last about 6 days.  The worse days are days 2-4.  She has been on pills in the past but has mood issues with these so is anxious about hormonal methods.     Patient Active Problem List   Diagnosis Date Noted   History of total hip replacement, right 07/12/2018   Preoperative evaluation to rule out surgical contraindication 06/22/2018   Femoral acetabular impingement 03/05/2018   Pain in joint of right hip 02/18/2018   Pain in joint of left shoulder 02/18/2018   Flank pain 12/17/2016   Dysuria 12/17/2016   History of depression 10/13/2016   h/o Generalized anxiety disorder with panic attacks 10/13/2016   Family history of psychiatric disorder 10/13/2016   Tingling in extremities 10/13/2016   Exercise intolerance 10/13/2016   Dizziness, nonspecific 10/13/2016   Chest discomfort 08/29/2016   Cardiac murmur 05/10/2014   Allergic rhinitis 05/10/2014   Dermatographism 05/10/2014   Interstitial cystitis 04/25/2014    Past Medical History:  Diagnosis Date   Abnormal Pap smear of cervix    2014 ASCUS HPV HR+, 2015 LSIL HPV HR+   Depression    GERD (gastroesophageal reflux disease)    Heart murmur    IBS (irritable bowel syndrome)    Mitral valve prolapse    MRSA (methicillin resistant Staphylococcus aureus)    infection on LT side of face, and forehead (02/2011)   Nephrolithiasis    Shingles     UTI (urinary tract infection)     Past Surgical History:  Procedure Laterality Date   CESAREAN SECTION  2007 & 2011   x2   COLPOSCOPY  [id number]   ESOPHAGOGASTRODUODENOSCOPY  03/25/2011   Procedure: ESOPHAGOGASTRODUODENOSCOPY (EGD);  Surgeon: Barrie Folk, MD;  Location: [facility] ENDOSCOPY;  Service: Endoscopy;  Laterality: N/A;   SAVORY DILATION  03/25/2011   Procedure: SAVORY DILATION;  Surgeon: Barrie Folk, MD;  Location: [facility] ENDOSCOPY;  Service: Endoscopy;  Laterality: N/A;   TOTAL HIP ARTHROPLASTY  2020   right   TUBAL LIGATION  2011   WISDOM TOOTH EXTRACTION  2003    MEDS:   Current Outpatient Medications on File Prior to Visit  Medication Sig Dispense Refill   buPROPion (WELLBUTRIN) 100 MG tablet Take 100 mg by mouth daily.     fexofenadine (ALLEGRA) 180 MG tablet Take 180 mg by mouth daily.     nitrofurantoin, macrocrystal-monohydrate, (MACROBID) 100 MG capsule Take 1 caps post coital 30 capsule 1   valACYclovir (VALTREX) 1000 MG tablet TAKE TWO TABLETS BY MOUTH TWICE DAILY FOR 1 DAY AT ONSET OF OUTBREAK 30 tablet 1   Vitamin D, Ergocalciferol, (DRISDOL) 1.25 MG (50000 UNIT) CAPS capsule Take 50,000 Units by mouth every 7 (seven) days. (Patient not taking: Reported on 03/05/2021)     No current facility-administered medications on file prior to visit.  ALLERGIES: Other, Quinolones, and Sulfa antibiotics  Family History  Problem Relation Age of Onset   Diabetes Mother        Type 2   Cancer Maternal Grandmother    Breast cancer Maternal Grandmother 55   Cancer Maternal Grandfather    Diabetes Maternal Grandfather        Type 1    Lung cancer Maternal Grandfather    Arthritis Father    Other Sister        Agenesis of corpus collosum   Heart attack Paternal Grandfather    Lupus Paternal Aunt     SH:  married, non smoker  Review of Systems  All other systems reviewed and are negative.  PHYSICAL EXAMINATION:    BP 137/90 (BP Location: Right Arm, Patient Position:  Sitting, Cuff Size: Large)    Pulse 79    Ht 5\' 5"  (1.651 m) Comment: reported   Wt 180 lb 6.4 oz (81.8 kg)    LMP 02/19/2021 (Approximate)    BMI 30.02 kg/m     General appearance: alert, cooperative and appears stated age Lymph:  no inguinal LAD noted  Pelvic: External genitalia:  erythematous external labia majora              Urethra:  normal appearing urethra with no masses, tenderness or lesions              Bartholins and Skenes: normal                 Vagina: watery vaginal discharge, erythematous vaginal mucosa              Cervix: no lesions              Bimanual Exam:  Uterus:  normal size, contour, position, consistency, mobility, non-tender         Chaperone, 02/21/2021, CMA, was present for exam.  Assessment/Plan: 1. Vaginal discharge - Cervicovaginal ancillary only( [place]) - Rx for Terazol 7, one applicator nightly x 7 nights, and use externally twice daily to pharmacy - May need to change treatment pending vaginitis test results.  2. Family history of diabetes mellitus (DM) - Hemoglobin A1c

## 2021-03-07 ENCOUNTER — Encounter (HOSPITAL_BASED_OUTPATIENT_CLINIC_OR_DEPARTMENT_OTHER): Payer: Self-pay | Admitting: Obstetrics & Gynecology

## 2021-03-07 ENCOUNTER — Other Ambulatory Visit (HOSPITAL_BASED_OUTPATIENT_CLINIC_OR_DEPARTMENT_OTHER): Payer: Self-pay | Admitting: Obstetrics & Gynecology

## 2021-03-07 LAB — CERVICOVAGINAL ANCILLARY ONLY
Bacterial Vaginitis (gardnerella): NEGATIVE
Candida Glabrata: NEGATIVE
Candida Vaginitis: NEGATIVE
Chlamydia: NEGATIVE
Comment: NEGATIVE
Comment: NEGATIVE
Comment: NEGATIVE
Comment: NEGATIVE
Comment: NEGATIVE
Comment: NORMAL
Neisseria Gonorrhea: NEGATIVE
Trichomonas: NEGATIVE

## 2021-03-07 LAB — HEMOGLOBIN A1C
Est. average glucose Bld gHb Est-mCnc: 117 mg/dL
Hgb A1c MFr Bld: 5.7 % — ABNORMAL HIGH (ref 4.8–5.6)

## 2021-03-07 MED ORDER — CLOBETASOL PROPIONATE 0.05 % EX OINT: 1.0000 "application " | TOPICAL_OINTMENT | Freq: Two times a day (BID) | CUTANEOUS | 0 refills | Status: AC

## 2021-03-08 MED ORDER — NONFORMULARY OR COMPOUNDED ITEM: 0 refills | Status: DC

## 2021-03-11 ENCOUNTER — Ambulatory Visit: Payer: BC Managed Care – PPO | Admitting: Obstetrics and Gynecology

## 2021-04-19 ENCOUNTER — Other Ambulatory Visit: Payer: Self-pay | Admitting: Obstetrics and Gynecology

## 2021-04-19 DIAGNOSIS — B009 Herpesviral infection, unspecified: Secondary | ICD-10-CM

## 2021-05-01 ENCOUNTER — Encounter (HOSPITAL_BASED_OUTPATIENT_CLINIC_OR_DEPARTMENT_OTHER): Payer: Self-pay | Admitting: Obstetrics & Gynecology

## 2022-01-30 ENCOUNTER — Other Ambulatory Visit: Payer: Self-pay | Admitting: Obstetrics and Gynecology

## 2022-01-30 DIAGNOSIS — B009 Herpesviral infection, unspecified: Secondary | ICD-10-CM

## 2022-01-31 NOTE — Telephone Encounter (Signed)
Med refill request:Valtrex 1000 mg tab, take 2 tab PO twice daily for 1 day at onset of outbreak.   Per review of Epic, patient transferred care to Dr. Sabra Heck.   Call placed to patient, states she will contact pharmacy and Dr. Sabra Heck at [facility] for refill. Patient appreciate of call.   Refill refused.   Routing to covering provider FYI.   Encounter closed.

## 2022-04-01 ENCOUNTER — Ambulatory Visit (HOSPITAL_BASED_OUTPATIENT_CLINIC_OR_DEPARTMENT_OTHER): Payer: PRIVATE HEALTH INSURANCE | Admitting: Obstetrics & Gynecology

## 2022-04-01 ENCOUNTER — Encounter (HOSPITAL_BASED_OUTPATIENT_CLINIC_OR_DEPARTMENT_OTHER): Payer: Self-pay | Admitting: Obstetrics & Gynecology

## 2022-04-01 VITALS — BP 122/84 | HR 71 | Ht 65.0 in | Wt 151.2 lb

## 2022-04-01 DIAGNOSIS — N920 Excessive and frequent menstruation with regular cycle: Secondary | ICD-10-CM | POA: Diagnosis not present

## 2022-04-01 DIAGNOSIS — B009 Herpesviral infection, unspecified: Secondary | ICD-10-CM

## 2022-04-01 DIAGNOSIS — Q789 Osteochondrodysplasia, unspecified: Secondary | ICD-10-CM

## 2022-04-01 DIAGNOSIS — Z1231 Encounter for screening mammogram for malignant neoplasm of breast: Secondary | ICD-10-CM | POA: Diagnosis not present

## 2022-04-01 DIAGNOSIS — Z01419 Encounter for gynecological examination (general) (routine) without abnormal findings: Secondary | ICD-10-CM

## 2022-04-01 DIAGNOSIS — N390 Urinary tract infection, site not specified: Secondary | ICD-10-CM | POA: Diagnosis not present

## 2022-04-01 MED ORDER — VALACYCLOVIR HCL 1 G PO TABS: ORAL_TABLET | ORAL | 1 refills | Status: DC

## 2022-04-01 MED ORDER — NITROFURANTOIN MONOHYD MACRO 100 MG PO CAPS: ORAL_CAPSULE | ORAL | 1 refills | Status: DC

## 2022-04-01 NOTE — Progress Notes (Signed)
42 y.o. G80P2002 Married White or Caucasian female here for annual exam.  Continues to have GI issues.  Has been followed at [place].  Had endoscopy and colonoscopy since I saw her last.  This was done 09/19/2021.  Having to have another endoscopy in April.  Colonoscopy was negative except for hemorrhoids.  Cycles are every 23 days.  Flow lasts 5 -6 days.  Uses TXA and bleeding is better but has some discharge out for several more days.    Pt does need RF for valtrex.  Uses for fever blisters.  Typically under good control.  Patient's last menstrual period was 03/02/2022 (exact date).          Sexually active: Yes.    The current method of family planning is tubal ligation.    Smoker:  no  Health Maintenance: Pap:  02/2020 neg HR HPV.  Will repeat next year. History of abnormal Pap:  h/o HPV, follow up has been normal.   MMG:  order placed Colonoscopy:  10/11/2021 Screening Labs: has had significant blood work this year   reports that she has never smoked. She has never used smokeless tobacco. She reports that she does not currently use alcohol. She reports that she does not use drugs.  Past Medical History:  Diagnosis Date   Abnormal Pap smear of cervix    2014 ASCUS HPV HR+, 2015 LSIL HPV HR+   Beals syndrome    Depression    GERD (gastroesophageal reflux disease)    Heart murmur    IBS (irritable bowel syndrome)    Mitral valve prolapse    MRSA (methicillin resistant Staphylococcus aureus)    infection on LT side of face, and forehead (02/2011)   Nephrolithiasis    Shingles    UTI (urinary tract infection)     Past Surgical History:  Procedure Laterality Date   CESAREAN SECTION  2007 & 2011   x2   COLPOSCOPY  [id number]   ESOPHAGOGASTRODUODENOSCOPY  03/25/2011   Procedure: ESOPHAGOGASTRODUODENOSCOPY (EGD);  Surgeon: Missy Sabins, MD;  Location: [facility] ENDOSCOPY;  Service: Endoscopy;  Laterality: N/A;   SAVORY DILATION  03/25/2011   Procedure: SAVORY DILATION;  Surgeon:  Missy Sabins, MD;  Location: [place];  Service: Endoscopy;  Laterality: N/A;   TOTAL HIP ARTHROPLASTY  2020   right   TUBAL LIGATION  2011   WISDOM TOOTH EXTRACTION  2003    Current Outpatient Medications  Medication Sig Dispense Refill   clobetasol ointment (TEMOVATE) [id number] % Apply 1 application topically 2 (two) times daily. Apply as directed twice daily 60 g 0   fexofenadine (ALLEGRA) 180 MG tablet Take 180 mg by mouth daily.     nitrofurantoin, macrocrystal-monohydrate, (MACROBID) 100 MG capsule Take 1 caps post coital 30 capsule 1   tranexamic acid (LYSTEDA) 650 MG TABS tablet Start taking with bleeding and take for up to 5 days. 30 tablet 2   valACYclovir (VALTREX) 1000 MG tablet TAKE TWO TABLETS BY MOUTH TWICE DAILY FOR 1 DAY AT ONSET OF OUTBREAK 30 tablet 1   No current facility-administered medications for this visit.    Family History  Problem Relation Age of Onset   Diabetes Mother        Type 2   Cancer Maternal Grandmother    Breast cancer Maternal Grandmother 17   Cancer Maternal Grandfather    Diabetes Maternal Grandfather        Type 1    Lung cancer Maternal Grandfather  Arthritis Father    Other Sister        Agenesis of corpus collosum   Heart attack Paternal Grandfather    Lupus Paternal Aunt     ROS: Constitutional: negative Genitourinary:negative  Exam:   BP 122/84   Pulse 71   Ht '5\' 5"'$  (1.651 m)   Wt 151 lb 3.2 oz (68.6 kg)   LMP 03/02/2022 (Exact Date)   BMI 25.16 kg/m   Height: '5\' 5"'$  (165.1 cm)  General appearance: alert, cooperative and appears stated age Head: Normocephalic, without obvious abnormality, atraumatic Neck: no adenopathy, supple, symmetrical, trachea midline and thyroid normal to inspection and palpation Lungs: clear to auscultation bilaterally Breasts: normal appearance, no masses or tenderness Heart: regular rate and rhythm Abdomen: soft, non-tender; bowel sounds normal; no masses,  no organomegaly Extremities:  extremities normal, atraumatic, no cyanosis or edema Skin: Skin color, texture, turgor normal. No rashes or lesions Lymph nodes: Cervical, supraclavicular, and axillary nodes normal. No abnormal inguinal nodes palpated Neurologic: Grossly normal   Pelvic: External genitalia:  no lesions              Urethra:  normal appearing urethra with no masses, tenderness or lesions              Bartholins and Skenes: normal                 Vagina: normal appearing vagina with normal color and no discharge, no lesions              Cervix: no lesions              Pap taken: No. Bimanual Exam:  Uterus:  normal size, contour, position, consistency, mobility, non-tender              Adnexa: normal adnexa and no mass, fullness, tenderness               Rectovaginal: Confirms               Anus:  normal sphincter tone, no lesions  Chaperone, Ezekiel Ina, RN, was present for exam.  Assessment/Plan: 1. Well woman exam with routine gynecological exam - Pap smear 02/2020.  Will repeat next year. - Mammogram order placed. - Colonoscopy 10/11/2021 - Bone mineral density not indicated at this time - lab work done reviewed in [place] - vaccines reviewed/updated  2. Beals syndrome  3. Menorrhagia with regular cycle - US PELVIS TRANSVAGINAL NON-OB (TV ONLY); Future  4. Recurrent UTI - nitrofurantoin, macrocrystal-monohydrate, (MACROBID) 100 MG capsule; Take 1 capsule post coital  Dispense: 30 capsule; Refill: 1  5. Encounter for screening mammogram for malignant neoplasm of breast - MM 3D SCREENING MAMMOGRAM BILATERAL BREAST; Future  6.  Oral HSV - RF for valtrex to pharmacy

## 2022-04-16 ENCOUNTER — Ambulatory Visit (HOSPITAL_BASED_OUTPATIENT_CLINIC_OR_DEPARTMENT_OTHER): Payer: PRIVATE HEALTH INSURANCE | Admitting: Obstetrics & Gynecology

## 2022-04-16 ENCOUNTER — Ambulatory Visit (HOSPITAL_BASED_OUTPATIENT_CLINIC_OR_DEPARTMENT_OTHER): Payer: PRIVATE HEALTH INSURANCE

## 2022-04-16 ENCOUNTER — Encounter (HOSPITAL_BASED_OUTPATIENT_CLINIC_OR_DEPARTMENT_OTHER): Payer: Self-pay | Admitting: Obstetrics & Gynecology

## 2022-04-16 VITALS — BP 120/82 | HR 75 | Ht 65.0 in | Wt 149.2 lb

## 2022-04-16 DIAGNOSIS — N83202 Unspecified ovarian cyst, left side: Secondary | ICD-10-CM

## 2022-04-16 DIAGNOSIS — N920 Excessive and frequent menstruation with regular cycle: Secondary | ICD-10-CM

## 2022-04-16 MED ORDER — NORETHINDRONE ACETATE 5 MG PO TABS: 5.0000 mg | ORAL_TABLET | Freq: Every day | ORAL | 3 refills | Status: DC

## 2022-04-17 NOTE — Progress Notes (Signed)
GYNECOLOGY  VISIT  CC:   discuss ultrasound/menorrhagia treatment  HPI: 42 y.o. G20P2002 Married White or Caucasian female here for discussion of ultrasound findings.  Uterus does appear normal.  Endometrium 9.76mm.  Left ovary with complex cyst with some vascular flow surrounding it.  Treatment options for bleeding discussed including progesterone methods (POP, oral progesterone, IUD, Depo Provera).  Endometrial ablation and hysterectomy discussed as well.  Pt does not want to be on any estrogen products.  Will start with oral progesterone.  Risks/side effects discussed.  Will plan to recheck ovary in 3 months.     Past Medical History:  Diagnosis Date   Abnormal Pap smear of cervix    2014 ASCUS HPV HR+, 2015 LSIL HPV HR+   Beals syndrome    Depression    GERD (gastroesophageal reflux disease)    Heart murmur    IBS (irritable bowel syndrome)    Mitral valve prolapse    MRSA (methicillin resistant Staphylococcus aureus)    infection on LT side of face, and forehead (02/2011)   Nephrolithiasis    Shingles    UTI (urinary tract infection)     MEDS:   Current Outpatient Medications on File Prior to Visit  Medication Sig Dispense Refill   clobetasol ointment (TEMOVATE) [id number] % Apply 1 application topically 2 (two) times daily. Apply as directed twice daily 60 g 0   fexofenadine (ALLEGRA) 180 MG tablet Take 180 mg by mouth daily.     nitrofurantoin, macrocrystal-monohydrate, (MACROBID) 100 MG capsule Take 1 capsule post coital 30 capsule 1   valACYclovir (VALTREX) 1000 MG tablet TAKE TWO TABLETS BY MOUTH TWICE DAILY FOR 1 DAY AT ONSET OF OUTBREAK 30 tablet 1   No current facility-administered medications on file prior to visit.    ALLERGIES: Other, Quinolones, and Sulfa antibiotics  SH:  married, non smoker  Review of Systems  Constitutional: Negative.   Genitourinary:        Heavy bleeding    PHYSICAL EXAMINATION:    BP 120/82 (BP Location: Right Arm, Patient Position:  Sitting, Cuff Size: Large)   Pulse 75   Ht 5\' 5"  (1.651 m) Comment: Reported  Wt 149 lb 3.2 oz (67.7 kg)   LMP 03/02/2022 (Exact Date)   BMI 24.83 kg/m     General appearance: alert, cooperative and appears stated age  Assessment/Plan: 1. Menorrhagia with regular cycle - will start with daily oral progesterone.  Recheck 3 months.  Side effects discussed. - norethindrone (AYGESTIN) 5 MG tablet; Take 1 tablet (5 mg total) by mouth daily.  Dispense: 30 tablet; Refill: 3  2. Left ovarian cyst - will recheck the complex cyst again in 3 months as well. - US PELVIS TRANSVAGINAL NON-OB (TV ONLY); Future

## 2022-06-04 ENCOUNTER — Other Ambulatory Visit (HOSPITAL_BASED_OUTPATIENT_CLINIC_OR_DEPARTMENT_OTHER): Payer: PRIVATE HEALTH INSURANCE

## 2022-06-04 ENCOUNTER — Ambulatory Visit (HOSPITAL_BASED_OUTPATIENT_CLINIC_OR_DEPARTMENT_OTHER): Payer: Self-pay | Admitting: Obstetrics & Gynecology

## 2022-07-02 ENCOUNTER — Ambulatory Visit (HOSPITAL_BASED_OUTPATIENT_CLINIC_OR_DEPARTMENT_OTHER): Payer: PRIVATE HEALTH INSURANCE | Admitting: Obstetrics & Gynecology

## 2022-07-02 ENCOUNTER — Ambulatory Visit (HOSPITAL_BASED_OUTPATIENT_CLINIC_OR_DEPARTMENT_OTHER): Payer: PRIVATE HEALTH INSURANCE

## 2022-07-02 ENCOUNTER — Encounter (HOSPITAL_BASED_OUTPATIENT_CLINIC_OR_DEPARTMENT_OTHER): Payer: Self-pay | Admitting: Obstetrics & Gynecology

## 2022-07-02 VITALS — BP 110/69 | HR 76 | Ht 65.0 in | Wt 151.4 lb

## 2022-07-02 DIAGNOSIS — Z8742 Personal history of other diseases of the female genital tract: Secondary | ICD-10-CM | POA: Diagnosis not present

## 2022-07-02 DIAGNOSIS — N3010 Interstitial cystitis (chronic) without hematuria: Secondary | ICD-10-CM | POA: Diagnosis not present

## 2022-07-02 DIAGNOSIS — N941 Unspecified dyspareunia: Secondary | ICD-10-CM | POA: Diagnosis not present

## 2022-07-02 DIAGNOSIS — I739 Peripheral vascular disease, unspecified: Secondary | ICD-10-CM

## 2022-07-02 DIAGNOSIS — N83202 Unspecified ovarian cyst, left side: Secondary | ICD-10-CM | POA: Diagnosis not present

## 2022-07-02 NOTE — Progress Notes (Signed)
GYNECOLOGY  VISIT  CC:   ultrasound follow up  HPI: 42 y.o. G90P2002 Married White or Caucasian female here for recheck of  complex ovarian cyst and to discuss treatment for menorrhagia.  Uterus today measuring 8 x 5.5 x 4.6cm.  Endometrium 1.0cm and trilaminar.  Ovary on the right measures 4.3 x 3.1 x 2.4cm with two follicles measuring 2.1cm and 1.2cm.  Left ovary measures 3.5 x 3.0 x 1.7cm.  Complex cyst has resolved.  Pt reports last cycle or two were normal.  She has been considering endometrial ablation.  However, lower uterine segment today was well seen where c section was done and the scar is definitely thin measuring about 5.56mm.  An concerned about possible bladder injury with endometrial ablation.  Pt has sister who has undergone hysterectomy and how having issues with prolapse.  Pt also has connective tissue disorder diagnosis and knows prolapse is a possible risk.    We discussed other treatment options for bleeding including IUD and oral progesterone.  She has an IUD in the past and did have a lot of cramping.  Had removed as she felt this was due to her IC.  Has not really had much management for this.  Has seen urology in the past who she felt was not helpful.  Discussed urogynecology referral.  Pt agrees to this and aware provider is on maternity leave.  Pelvic PT discussed as well prior to considering IUD use again.    Also, she was prescribed norethindrone at last visit but isn't taking it.  May also just want to monitor to see how cycles are going to be over the next several months.  Pap negative 03/08/2020 with neg HR HPV.  Repeat will be due next year.     Past Medical History:  Diagnosis Date   Abnormal Pap smear of cervix    2014 ASCUS HPV HR+, 2015 LSIL HPV HR+   Beals syndrome    Depression    GERD (gastroesophageal reflux disease)    Heart murmur    IBS (irritable bowel syndrome)    Mitral valve prolapse    MRSA (methicillin resistant Staphylococcus aureus)     infection on LT side of face, and forehead (02/2011)   Nephrolithiasis    Shingles    UTI (urinary tract infection)     MEDS:   Current Outpatient Medications on File Prior to Visit  Medication Sig Dispense Refill   clobetasol ointment (TEMOVATE) 0.05 % Apply 1 application topically 2 (two) times daily. Apply as directed twice daily 60 g 0   fexofenadine (ALLEGRA) 180 MG tablet Take 180 mg by mouth daily.     nitrofurantoin, macrocrystal-monohydrate, (MACROBID) 100 MG capsule Take 1 capsule post coital 30 capsule 1   valACYclovir (VALTREX) 1000 MG tablet TAKE TWO TABLETS BY MOUTH TWICE DAILY FOR 1 DAY AT ONSET OF OUTBREAK 30 tablet 1   norethindrone (AYGESTIN) 5 MG tablet Take 1 tablet (5 mg total) by mouth daily. (Patient not taking: Reported on 07/02/2022) 30 tablet 3   No current facility-administered medications on file prior to visit.    ALLERGIES: Other, Quinolones, and Sulfa antibiotics  SH:  married, non smoker  Review of Systems  Constitutional: Negative.   Genitourinary:        H/o menorrhagia    PHYSICAL EXAMINATION:    BP 110/69 (BP Location: Right Arm, Patient Position: Sitting, Cuff Size: Large)   Pulse 76   Ht 5\' 5"  (1.651 m) Comment: Reported  Wt 151  lb 6.4 oz (68.7 kg)   BMI 25.19 kg/m     General appearance: alert, cooperative and appears stated age   Assessment/Plan: 1. History of menorrhagia - pt has been considering endometrial ablation but I am concerned about how thin the myometrium looks where C section scar is located - she is not sure she wants a hysterectomy either given h/o connective tissue d/o.  2. Dyspareunia in female - Ambulatory referral to Physical Therapy  3. Interstitial cystitis - Ambulatory referral to Urogynecology and to PT as well

## 2022-07-05 ENCOUNTER — Encounter (HOSPITAL_BASED_OUTPATIENT_CLINIC_OR_DEPARTMENT_OTHER): Payer: Self-pay | Admitting: Obstetrics & Gynecology

## 2022-07-05 DIAGNOSIS — Z8742 Personal history of other diseases of the female genital tract: Secondary | ICD-10-CM | POA: Insufficient documentation

## 2022-07-15 ENCOUNTER — Ambulatory Visit: Payer: PRIVATE HEALTH INSURANCE | Admitting: Obstetrics and Gynecology

## 2022-07-15 ENCOUNTER — Encounter: Payer: Self-pay | Admitting: Obstetrics and Gynecology

## 2022-07-15 VITALS — BP 116/86 | HR 78 | Ht 64.5 in | Wt 154.0 lb

## 2022-07-15 DIAGNOSIS — R350 Frequency of micturition: Secondary | ICD-10-CM

## 2022-07-15 DIAGNOSIS — M6289 Other specified disorders of muscle: Secondary | ICD-10-CM | POA: Diagnosis not present

## 2022-07-15 DIAGNOSIS — N8110 Cystocele, unspecified: Secondary | ICD-10-CM | POA: Diagnosis not present

## 2022-07-15 DIAGNOSIS — N393 Stress incontinence (female) (male): Secondary | ICD-10-CM

## 2022-07-15 DIAGNOSIS — N3010 Interstitial cystitis (chronic) without hematuria: Secondary | ICD-10-CM

## 2022-07-15 LAB — POCT URINALYSIS DIPSTICK
Bilirubin, UA: NEGATIVE
Blood, UA: NEGATIVE
Glucose, UA: NEGATIVE
Ketones, UA: NEGATIVE
Leukocytes, UA: NEGATIVE
Nitrite, UA: NEGATIVE
Protein, UA: NEGATIVE
Spec Grav, UA: 1.02 (ref 1.010–1.025)
Urobilinogen, UA: 0.2 E.U./dL
pH, UA: 7.5 (ref 5.0–8.0)

## 2022-07-15 MED ORDER — METHENAMINE HIPPURATE 1 G PO TABS
1.0000 g | ORAL_TABLET | Freq: Two times a day (BID) | ORAL | 2 refills | Status: DC
Start: 2022-07-15 — End: 2022-10-21
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
## 2022-07-15 NOTE — Patient Instructions (Addendum)
Today we talked about ways to manage bladder urgency such as altering your diet to avoid irritative beverages and foods (bladder diet) as well as attempting to decrease stress and other exacerbating factors.  You can also chew a plain Tums 1-3 times per day to make your urine less acidic, especially if you have eating/drinking acidic things.   There is a website with helpful information for people with bladder irritation, called the IC Network at https://www.ic-network.com. This website has more information about a healthy bladder diet and patient forums for support.  The Most Bothersome Foods* The Least Bothersome Foods*  Coffee - Regular & Decaf Tea - caffeinated Carbonated beverages - cola, non-colas, diet & caffeine-free Alcohols - Beer, Red Wine, White Wine, [address] - Grapefruit, [place], Orange, [employer] - Cranberry, Grapefruit, Orange, Pineapple Vegetables - Tomato & Tomato Products Flavor Enhancers - Hot peppers, Spicy foods, Chili, Horseradish, Vinegar, Monosodium glutamate (MSG) Artificial Sweeteners - NutraSweet, Sweet 'N Low, Equal (sweetener), Saccharin Ethnic foods - [place], [place], [place] food [employer] - low-fat & whole Fruits - Bananas, Blueberries, Honeydew melon, Pears, Raisins, Watermelon Vegetables - Broccoli, [address] Sprouts, [place], Carrots, Cauliflower, [place], Cucumber, Mushrooms, Peas, Radishes, Squash, Zucchini, White potatoes, Sweet potatoes & yams Poultry - Chicken, Eggs, [place], [employer] - Beef, Diplomatic Services operational officer, Lamb Seafood - Shrimp, [place] fish, Salmon Grains - Oat, Rice Snacks - Pretzels, Popcorn  *Lenward Chancellor et al. Diet and its role in interstitial cystitis/bladder pain syndrome (IC/BPS) and comorbid conditions. [employer]. BJU Int. 2012 Jan 11.     Start pelvic floor PT.   Start Methenamine (Hiprex), this is the anti infective not an antibiotic. I would not take the antibiotic. You can still take the AZO.   Please keep in mind we can  do bladder installations (This is once a week for 6 weeks)  Coconut oil for vaginal moisture.

## 2022-07-29 ENCOUNTER — Other Ambulatory Visit (HOSPITAL_COMMUNITY)
Admission: RE | Admit: 2022-07-29 | Discharge: 2022-07-29 | Disposition: A | Discharge status: Home / Self Care | Payer: PRIVATE HEALTH INSURANCE | Source: Ambulatory Visit | Attending: Obstetrics and Gynecology | Admitting: Obstetrics and Gynecology

## 2022-07-29 ENCOUNTER — Ambulatory Visit (INDEPENDENT_AMBULATORY_CARE_PROVIDER_SITE_OTHER): Payer: PRIVATE HEALTH INSURANCE

## 2022-07-29 ENCOUNTER — Encounter: Payer: Self-pay | Admitting: Obstetrics and Gynecology

## 2022-07-29 DIAGNOSIS — R300 Dysuria: Secondary | ICD-10-CM | POA: Diagnosis present

## 2022-07-29 DIAGNOSIS — R350 Frequency of micturition: Secondary | ICD-10-CM

## 2022-07-29 DIAGNOSIS — R82998 Other abnormal findings in urine: Secondary | ICD-10-CM

## 2022-07-29 LAB — POCT URINALYSIS DIPSTICK
Bilirubin, UA: NEGATIVE
Blood, UA: NEGATIVE
Glucose, UA: POSITIVE — AB
Ketones, UA: NEGATIVE
Leukocytes, UA: NEGATIVE
Nitrite, UA: POSITIVE
Protein, UA: POSITIVE — AB
Spec Grav, UA: 1.015 (ref 1.010–1.025)
Urobilinogen, UA: 2 E.U./dL — AB
pH, UA: 7 (ref 5.0–8.0)

## 2022-07-29 NOTE — Patient Instructions (Signed)
Your Urine dip that was done in office was POSITIVE. I am sending the urine off for culture and you can take AZO over the counter for your discomfort.  We have not ordered and medication as we will wait for the culture to come back  We will contact you when the results are back between 3-5 days. If a different antibiotic is needed we will sent the order to the pharmacy and you will be notified. If you have any questions or concerns please feel free to call us at [phone]

## 2022-07-30 ENCOUNTER — Ambulatory Visit: Payer: PRIVATE HEALTH INSURANCE

## 2022-07-30 ENCOUNTER — Other Ambulatory Visit: Payer: Self-pay

## 2022-07-30 ENCOUNTER — Ambulatory Visit (INDEPENDENT_AMBULATORY_CARE_PROVIDER_SITE_OTHER): Payer: PRIVATE HEALTH INSURANCE

## 2022-07-30 DIAGNOSIS — N3010 Interstitial cystitis (chronic) without hematuria: Secondary | ICD-10-CM

## 2022-07-30 LAB — URINE CULTURE: Culture: NO GROWTH

## 2022-07-30 MED ORDER — HEPARIN SODIUM (PORCINE) 10000 UNIT/ML IJ SOLN
10000.0000 [IU] | Freq: Once | INTRAMUSCULAR | Status: AC
Start: 2022-07-30 — End: 2022-07-30
  Administered 2022-07-30: 10000 [IU] via INTRAVESICAL

## 2022-07-30 MED ORDER — LIDOCAINE HCL 2 % IJ SOLN
20.0000 mL | Freq: Once | INTRAMUSCULAR | Status: AC
Start: 2022-07-30 — End: 2022-07-30
  Administered 2022-07-30: 400 mg

## 2022-07-30 MED ORDER — SODIUM BICARBONATE 8.4 % IV SOLN
5.0000 mL | Freq: Once | INTRAVENOUS | Status: AC
Start: 2022-07-30 — End: 2022-07-30
  Administered 2022-07-30: 5 mL

## 2022-07-30 MED ORDER — BUPIVACAINE HCL 0.25 % IJ SOLN
20.0000 mL | Freq: Once | INTRAMUSCULAR | Status: AC
Start: 2022-07-30 — End: 2022-07-30
  Administered 2022-07-30: 20 mL

## 2022-07-30 NOTE — Patient Instructions (Signed)
Keep all scheduled future appointments

## 2022-08-05 ENCOUNTER — Ambulatory Visit: Payer: PRIVATE HEALTH INSURANCE | Attending: Obstetrics & Gynecology | Admitting: Physical Therapy

## 2022-08-05 ENCOUNTER — Other Ambulatory Visit: Payer: Self-pay

## 2022-08-05 DIAGNOSIS — M62838 Other muscle spasm: Secondary | ICD-10-CM | POA: Insufficient documentation

## 2022-08-05 DIAGNOSIS — M6281 Muscle weakness (generalized): Secondary | ICD-10-CM | POA: Insufficient documentation

## 2022-08-05 DIAGNOSIS — R279 Unspecified lack of coordination: Secondary | ICD-10-CM | POA: Insufficient documentation

## 2022-08-05 DIAGNOSIS — N941 Unspecified dyspareunia: Secondary | ICD-10-CM | POA: Diagnosis not present

## 2022-08-05 DIAGNOSIS — R293 Abnormal posture: Secondary | ICD-10-CM | POA: Insufficient documentation
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
## 2022-08-12 ENCOUNTER — Other Ambulatory Visit (HOSPITAL_COMMUNITY)
Admission: RE | Admit: 2022-08-12 | Discharge: 2022-08-12 | Disposition: A | Discharge status: Home / Self Care | Payer: PRIVATE HEALTH INSURANCE | Source: Ambulatory Visit | Attending: Obstetrics and Gynecology | Admitting: Obstetrics and Gynecology

## 2022-08-12 ENCOUNTER — Ambulatory Visit (INDEPENDENT_AMBULATORY_CARE_PROVIDER_SITE_OTHER): Payer: PRIVATE HEALTH INSURANCE | Admitting: Obstetrics and Gynecology

## 2022-08-12 ENCOUNTER — Encounter: Payer: Self-pay | Admitting: Obstetrics and Gynecology

## 2022-08-12 VITALS — BP 120/82 | HR 79

## 2022-08-12 DIAGNOSIS — N898 Other specified noninflammatory disorders of vagina: Secondary | ICD-10-CM

## 2022-08-12 DIAGNOSIS — R350 Frequency of micturition: Secondary | ICD-10-CM | POA: Diagnosis not present

## 2022-08-12 DIAGNOSIS — N3010 Interstitial cystitis (chronic) without hematuria: Secondary | ICD-10-CM

## 2022-08-12 DIAGNOSIS — B379 Candidiasis, unspecified: Secondary | ICD-10-CM | POA: Diagnosis not present

## 2022-08-12 LAB — POCT URINALYSIS DIPSTICK
Bilirubin, UA: NEGATIVE
Blood, UA: NEGATIVE
Glucose, UA: NEGATIVE
Ketones, UA: NEGATIVE
Leukocytes, UA: NEGATIVE
Nitrite, UA: NEGATIVE
Protein, UA: NEGATIVE
Spec Grav, UA: 1.025 (ref 1.010–1.025)
Urobilinogen, UA: 0.2 E.U./dL
pH, UA: 6 (ref 5.0–8.0)

## 2022-08-12 MED ORDER — NONFORMULARY OR COMPOUNDED ITEM: 5 refills | Status: AC

## 2022-08-12 MED ORDER — LIDOCAINE HCL 2 % IJ SOLN
20.0000 mL | Freq: Once | INTRAMUSCULAR | Status: AC
Start: 2022-08-12 — End: 2022-08-12
  Administered 2022-08-12: 400 mg

## 2022-08-12 MED ORDER — SODIUM BICARBONATE 8.4 % IV SOLN
5.0000 mL | Freq: Once | INTRAVENOUS | Status: AC
Start: 2022-08-12 — End: 2022-08-12
  Administered 2022-08-12: 5 mL

## 2022-08-12 MED ORDER — BUPIVACAINE HCL 0.25 % IJ SOLN
20.0000 mL | Freq: Once | INTRAMUSCULAR | Status: AC
Start: 2022-08-12 — End: 2022-08-12
  Administered 2022-08-12: 20 mL

## 2022-08-12 MED ORDER — HEPARIN SODIUM (PORCINE) 10000 UNIT/ML IJ SOLN
10000.0000 [IU] | Freq: Once | INTRAMUSCULAR | Status: AC
Start: 2022-08-12 — End: 2022-08-12
  Administered 2022-08-12: 10000 [IU] via INTRAVESICAL

## 2022-08-12 NOTE — Progress Notes (Signed)
Bladder Instillation: The patient was identified and verbally consented for the procedure.  The urethra was prepped with Betadine x 3.  A 14 Fr foley catheter was inserted and bladder was drained for 150cc of urine then attached to a 60 mL syringe with the plunger removed. The medication was slowly poured into the bladder via the syringe and foley. The medication consisted of 20ml of Lidocaine 2%, 20mL of Bupivicaine 0.25%, 10,000 units/mL Heparin, 5mL Sodium Bicarbonate 8.4%. The foley was removed and the patient was asked to hold the liquids in her bladder for 30-60 minutes if possible.   Precautions were given and patient was instructed to call the office or on-call number for any concerns.  Salina April, CMA

## 2022-08-12 NOTE — Progress Notes (Signed)
[facility] Return Visit  SUBJECTIVE  History of Present Illness: Emily [name] is a 42 y.o. female seen in follow-up for IC. Patient has had one bladder installation which was mildly helpful her as she reports it decreased her urgency and frequency.   She is still having pain/irritation in the vaginal/perineal region. She reports she feels like if she applies pressure that it lessens her symptoms.    Past Medical History: Patient  has a past medical history of Abnormal Pap smear of cervix, Beals syndrome, Depression, GERD (gastroesophageal reflux disease), Heart murmur, IBS (irritable bowel syndrome), Mitral valve prolapse, MRSA (methicillin resistant Staphylococcus aureus), Nephrolithiasis, Recurrent UTI, and Shingles.   Past Surgical History: She  has a past surgical history that includes Tubal ligation (2011); Wisdom tooth extraction (2003); Esophagogastroduodenoscopy (03/25/2011); Savory dilation (03/25/2011); Cesarean section (2007 & 2011); Colposcopy (4098,1191); and Total hip arthroplasty (2020).   Medications: She has a current medication list which includes the following prescription(s): clobetasol ointment, fexofenadine, methenamine, NONFORMULARY OR COMPOUNDED ITEM, norethindrone, valacyclovir, and nitrofurantoin (macrocrystal-monohydrate).   Allergies: Patient is allergic to other, quinolones, and sulfa antibiotics.   Social History: Patient  reports that she has never smoked. She has never used smokeless tobacco. She reports that she does not currently use alcohol. She reports that she does not use drugs.      OBJECTIVE     Physical Exam: Vitals:   08/12/22 0843  BP: 120/82  Pulse: 79   Gen: No apparent distress, A&O x 3.  Detailed Urogynecologic Evaluation:  Vaginal speculum exam reveals some vaginal discharge with dark blood smearing in vaginal canal. No obvious signs of yeast or BV. Slight irritation noted at the skin at the vaginal opening.      ASSESSMENT AND PLAN    Emily [name] is a 42 y.o. with:  1. Vaginal irritation   2. IC (interstitial cystitis)   3. Bladder pain syndrome   4. Urinary frequency    Aptima swab obtained today. Patient stated she is starting her cycle today so that is where the dark blood smearing on exam came from. For the pain at the introitus, will prescribe compounded cream with baclofen, gabapentin and amitriptyline to hopefully alleviate the vaginal pain/pressure.  Bladder installation completed by CMA at end of visit to assist with controlling IC symptoms as she had some relief from the first bladder installation.  Patient reports she has Azo she can take if she needs it for the bladder pain and that has been helping.  POC Urine negative today for sings of infection.   Patient to return in about 1 month or sooner if needed.

## 2022-08-12 NOTE — Patient Instructions (Addendum)
Please use the compounded cream for the irritation at the opening of the vagina.   We will call you with the results of the swab.   I hope this bladder irritation along with the cream is helpful for you!

## 2022-08-13 LAB — CERVICOVAGINAL ANCILLARY ONLY
Bacterial Vaginitis (gardnerella): NEGATIVE
Candida Glabrata: NEGATIVE
Candida Vaginitis: NEGATIVE
Comment: NEGATIVE
Comment: NEGATIVE
Comment: NEGATIVE

## 2022-08-13 NOTE — Progress Notes (Signed)
Please call and let patient know that her swab was negative for yeast and BV.

## 2022-09-09 ENCOUNTER — Ambulatory Visit: Payer: PRIVATE HEALTH INSURANCE | Attending: Obstetrics & Gynecology | Admitting: Physical Therapy

## 2022-09-09 DIAGNOSIS — M6281 Muscle weakness (generalized): Secondary | ICD-10-CM | POA: Diagnosis present

## 2022-09-09 DIAGNOSIS — M62838 Other muscle spasm: Secondary | ICD-10-CM | POA: Diagnosis present

## 2022-09-09 DIAGNOSIS — R293 Abnormal posture: Secondary | ICD-10-CM | POA: Insufficient documentation

## 2022-09-09 DIAGNOSIS — R279 Unspecified lack of coordination: Secondary | ICD-10-CM | POA: Insufficient documentation
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
## 2022-09-15 ENCOUNTER — Ambulatory Visit: Payer: PRIVATE HEALTH INSURANCE | Admitting: Obstetrics and Gynecology

## 2022-09-26 ENCOUNTER — Ambulatory Visit: Payer: PRIVATE HEALTH INSURANCE | Admitting: Physical Therapy

## 2022-10-07 ENCOUNTER — Ambulatory Visit: Payer: PRIVATE HEALTH INSURANCE | Attending: Obstetrics & Gynecology | Admitting: Physical Therapy

## 2022-10-07 DIAGNOSIS — M62838 Other muscle spasm: Secondary | ICD-10-CM

## 2022-10-07 DIAGNOSIS — M6281 Muscle weakness (generalized): Secondary | ICD-10-CM | POA: Diagnosis present

## 2022-10-07 DIAGNOSIS — R293 Abnormal posture: Secondary | ICD-10-CM | POA: Diagnosis present
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
## 2022-10-20 ENCOUNTER — Encounter: Payer: Self-pay | Admitting: Obstetrics and Gynecology

## 2022-10-20 ENCOUNTER — Other Ambulatory Visit: Payer: Self-pay | Admitting: Obstetrics and Gynecology

## 2022-10-20 DIAGNOSIS — N3010 Interstitial cystitis (chronic) without hematuria: Secondary | ICD-10-CM

## 2022-10-21 ENCOUNTER — Encounter: Payer: PRIVATE HEALTH INSURANCE | Admitting: Physical Therapy

## 2022-10-21 MED ORDER — METHENAMINE HIPPURATE 1 G PO TABS: 1.0000 g | ORAL_TABLET | Freq: Two times a day (BID) | ORAL | 2 refills | Status: AC

## 2022-11-04 ENCOUNTER — Ambulatory Visit: Payer: PRIVATE HEALTH INSURANCE | Attending: Obstetrics & Gynecology | Admitting: Physical Therapy

## 2022-11-04 ENCOUNTER — Encounter (HOSPITAL_BASED_OUTPATIENT_CLINIC_OR_DEPARTMENT_OTHER): Payer: Self-pay | Admitting: Obstetrics & Gynecology

## 2022-11-04 DIAGNOSIS — M62838 Other muscle spasm: Secondary | ICD-10-CM | POA: Insufficient documentation

## 2022-11-04 DIAGNOSIS — M6281 Muscle weakness (generalized): Secondary | ICD-10-CM | POA: Insufficient documentation

## 2022-11-04 DIAGNOSIS — R293 Abnormal posture: Secondary | ICD-10-CM | POA: Diagnosis present
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
## 2022-11-18 ENCOUNTER — Encounter: Payer: PRIVATE HEALTH INSURANCE | Admitting: Physical Therapy

## 2022-11-26 ENCOUNTER — Ambulatory Visit: Payer: PRIVATE HEALTH INSURANCE

## 2022-11-26 DIAGNOSIS — Z1231 Encounter for screening mammogram for malignant neoplasm of breast: Secondary | ICD-10-CM

## 2022-11-28 ENCOUNTER — Encounter (HOSPITAL_BASED_OUTPATIENT_CLINIC_OR_DEPARTMENT_OTHER): Payer: Self-pay | Admitting: Obstetrics & Gynecology

## 2022-12-02 ENCOUNTER — Encounter: Payer: PRIVATE HEALTH INSURANCE | Admitting: Physical Therapy

## 2023-02-13 ENCOUNTER — Other Ambulatory Visit (HOSPITAL_BASED_OUTPATIENT_CLINIC_OR_DEPARTMENT_OTHER): Payer: Self-pay

## 2023-02-13 DIAGNOSIS — B009 Herpesviral infection, unspecified: Secondary | ICD-10-CM

## 2023-02-13 MED ORDER — VALACYCLOVIR HCL 1 G PO TABS: ORAL_TABLET | ORAL | 1 refills | Status: DC

## 2023-04-17 ENCOUNTER — Other Ambulatory Visit (HOSPITAL_COMMUNITY)
Admission: RE | Admit: 2023-04-17 | Discharge: 2023-04-17 | Disposition: A | Discharge status: Home / Self Care | Payer: PRIVATE HEALTH INSURANCE | Source: Ambulatory Visit | Attending: Obstetrics & Gynecology | Admitting: Obstetrics & Gynecology

## 2023-04-17 ENCOUNTER — Ambulatory Visit (HOSPITAL_BASED_OUTPATIENT_CLINIC_OR_DEPARTMENT_OTHER): Payer: PRIVATE HEALTH INSURANCE | Admitting: Obstetrics & Gynecology

## 2023-04-17 ENCOUNTER — Encounter (HOSPITAL_BASED_OUTPATIENT_CLINIC_OR_DEPARTMENT_OTHER): Payer: Self-pay | Admitting: Obstetrics & Gynecology

## 2023-04-17 VITALS — BP 116/68 | HR 84 | Ht 65.0 in | Wt 156.2 lb

## 2023-04-17 DIAGNOSIS — D508 Other iron deficiency anemias: Secondary | ICD-10-CM | POA: Diagnosis not present

## 2023-04-17 DIAGNOSIS — N3010 Interstitial cystitis (chronic) without hematuria: Secondary | ICD-10-CM | POA: Diagnosis not present

## 2023-04-17 DIAGNOSIS — B009 Herpesviral infection, unspecified: Secondary | ICD-10-CM

## 2023-04-17 DIAGNOSIS — Z01419 Encounter for gynecological examination (general) (routine) without abnormal findings: Secondary | ICD-10-CM

## 2023-04-17 DIAGNOSIS — Z124 Encounter for screening for malignant neoplasm of cervix: Secondary | ICD-10-CM | POA: Diagnosis present

## 2023-04-17 DIAGNOSIS — Z8742 Personal history of other diseases of the female genital tract: Secondary | ICD-10-CM | POA: Diagnosis not present

## 2023-04-17 MED ORDER — VALACYCLOVIR HCL 1 G PO TABS: ORAL_TABLET | ORAL | 1 refills | Status: AC

## 2023-04-17 NOTE — Progress Notes (Signed)
 ANNUAL EXAM Patient name: Emily [name] MRN [id number]  Date of birth: 06-12-1980 Chief Complaint:   Annual Exam  History of Present Illness:   Emily [name] is a 43 y.o. G90P2002 Caucasian female being seen today for a routine annual exam.  Feels like the pelvic PT really helped.  Cycles are regular and lasts for five days.  Pain is much better.  She's had two IC flares.  She's had two bladder instillations and this has helped as well.  She saw Yvonna Alanis at [facility] for these.  Hiprex recommended.  She had a lot of stomach irritation with this so stopped it.  Does have compounded cream which helps as well.    Has current anemia.  Iron levels are not normal.  Colonoscopy 2023 and this was normal.  Repeat recommended to r/o GI bleeding.  Does have blood in stool but has hemorrhoid.  Recent lipase level was low.  Diagnosed with GERD.    LMP:  04/03/2023  Last pap 03/08/2020. Results were: NILM w/ HRHPV negative. H/O abnormal pap: yes Last mammogram: 11/26/2022. Results were: normal. Family h/o breast cancer: yes :  MGM Last colonoscopy: 10/18/2021. Results were: normal. Family h/o colorectal cancer: no     04/17/2023    8:51 AM 07/02/2022    1:24 PM 04/16/2022    1:21 PM 04/01/2022    3:13 PM 03/05/2021   12:08 PM  Depression screen PHQ 2/9  Decreased Interest 0 0 0 0 0  Down, Depressed, Hopeless 0 0 0 0 0  PHQ - 2 Score 0 0 0 0 0        01/26/2019    3:46 PM  GAD 7 : Generalized Anxiety Score  Nervous, Anxious, on Edge 0  Control/stop worrying 0  Worry too much - different things 0  Trouble relaxing 0  Restless 0  Easily annoyed or irritable 0  Afraid - awful might happen 0  Total GAD 7 Score 0  Anxiety Difficulty Not difficult at all     Review of Systems:   Pertinent items are noted in HPI  Denies any midcycle bleeding, vaginal bleeding.  Urination and bowel function are stable.  Denies vaginal discharge.  No new breast issues.  Pertinent History Reviewed:  Reviewed  past medical,surgical, social and family history.   Reviewed problem list, medications and allergies. Physical Assessment:   Vitals:   04/17/23 0848  BP: 116/68  Pulse: 84  Weight: 156 lb 3.2 oz (70.9 kg)  Height: 5\' 5"  (1.651 m)  Body mass index is 25.99 kg/m.        Physical Examination:   General appearance - well appearing, and in no distress  Mental status - alert, oriented to person, place, and time  Psych:  She has a normal mood and affect  Skin - warm and dry, normal color, no suspicious lesions noted  Chest - effort normal, all lung fields clear to auscultation bilaterally  Heart - normal rate and regular rhythm  Neck:  midline trachea, no thyromegaly or nodules  Breasts - breasts appear normal, no suspicious masses, no skin or nipple changes or  axillary nodes  Abdomen - soft, nontender, nondistended, no masses or organomegaly  Pelvic - VULVA: normal appearing vulva with no masses, tenderness or lesions   VAGINA: normal appearing vagina with normal color and discharge, no lesions   CERVIX: normal appearing cervix without discharge or lesions, no CMT  Thin prep pap is done with HR HPV cotesting  UTERUS:  uterus is felt to be normal size, shape, consistency and nontender   ADNEXA: No adnexal masses or tenderness noted.  Rectal - deferred  Extremities:  No swelling or varicosities noted  Chaperone present for exam  Assessment & Plan:  1. Well woman exam with routine gynecological exam (Primary) - Pap smear with HR HPV obtained today - Mammogram 10/2022 - Colonoscopy 2023.  Follow up is scheduled. - lab work done with PCP - vaccines reviewed/updated  2. Cervical cancer screening - Cytology - PAP( [place])  3. History of menorrhagia - bleeding is improved  4. Interstitial cystitis - followed by urogynecology now.    5. Other iron deficiency anemia - followed by GI.  Repeat endoscopy and colonoscopy planned for 05/01/2023.  6. Herpes (fever blisters)   - RF  for valtrex sent to pharmacy - valACYclovir (VALTREX) 1000 MG tablet; TAKE TWO TABLETS BY MOUTH TWICE DAILY FOR 1 DAY AT ONSET OF OUTBREAK  Dispense: 30 tablet; Refill: 1   Meds:  Meds ordered this encounter  Medications   valACYclovir (VALTREX) 1000 MG tablet    Sig: TAKE TWO TABLETS BY MOUTH TWICE DAILY FOR 1 DAY AT ONSET OF OUTBREAK    Dispense:  30 tablet    Refill:  1    Follow-up: Return in about 1 year (around 04/16/2024).  Jerene Bears, MD 04/17/2023 9:28 AM

## 2023-04-21 LAB — CYTOLOGY - PAP
Adequacy: ABSENT
Comment: NEGATIVE
Diagnosis: NEGATIVE
High risk HPV: NEGATIVE

## 2023-04-27 ENCOUNTER — Encounter (HOSPITAL_BASED_OUTPATIENT_CLINIC_OR_DEPARTMENT_OTHER): Payer: Self-pay | Admitting: Obstetrics & Gynecology

## 2023-09-23 ENCOUNTER — Encounter (HOSPITAL_BASED_OUTPATIENT_CLINIC_OR_DEPARTMENT_OTHER): Payer: Self-pay | Admitting: Obstetrics & Gynecology

## 2023-09-23 ENCOUNTER — Telehealth (HOSPITAL_BASED_OUTPATIENT_CLINIC_OR_DEPARTMENT_OTHER): Payer: Self-pay

## 2023-09-23 NOTE — Telephone Encounter (Signed)
 Left message to call Ashvik Grundman at [phone].

## 2023-09-23 NOTE — Telephone Encounter (Signed)
 Spoke with patient. Patient reports intermittent low pelvic pain on the left side that began 4 months ago. Has increased in the last two days and is now bilateral. Reports pain is sharp and lasts around 1 1/2 minutes. Last night when moving in bed pain in left pelvic area was a 10/10 for 1- 1 1/2 minutes. Took tylenol without relief. States pain is a 2/10 today with certain movements. Denies N/V, chills, fever, pain with urination, and urinary frequency. Feeling very hormonal over the last few months. LMP 09/06/2023. Cycle is typically every 28 days. Last month was 22 days. First 2 days are heavy changing pad every 2 hours. Next few days turn moderate and then light. Cycle lasting 5 days. Advised will need to be seen in the office for further evaluation. Patient is agreeable. Appointment scheduled for 09/24/23 at 9:15 am with Dr.Miller. Patient is agreeable to date and time. Advised patient to call back or seek immediate medical care if pain worsens or develops new symptoms. Patient verbalizes understanding.

## 2023-09-24 ENCOUNTER — Ambulatory Visit (HOSPITAL_BASED_OUTPATIENT_CLINIC_OR_DEPARTMENT_OTHER)
Admission: RE | Admit: 2023-09-24 | Discharge: 2023-09-24 | Disposition: A | Discharge status: Home / Self Care | Payer: PRIVATE HEALTH INSURANCE | Source: Ambulatory Visit | Attending: Obstetrics & Gynecology | Admitting: Obstetrics & Gynecology

## 2023-09-24 ENCOUNTER — Ambulatory Visit (INDEPENDENT_AMBULATORY_CARE_PROVIDER_SITE_OTHER): Payer: PRIVATE HEALTH INSURANCE | Admitting: Obstetrics & Gynecology

## 2023-09-24 ENCOUNTER — Encounter (HOSPITAL_BASED_OUTPATIENT_CLINIC_OR_DEPARTMENT_OTHER): Payer: Self-pay | Admitting: Obstetrics & Gynecology

## 2023-09-24 VITALS — BP 131/86 | HR 74 | Temp 98.4°F | Wt 154.0 lb

## 2023-09-24 DIAGNOSIS — R102 Pelvic and perineal pain: Secondary | ICD-10-CM

## 2023-09-24 DIAGNOSIS — Z8742 Personal history of other diseases of the female genital tract: Secondary | ICD-10-CM

## 2023-09-24 LAB — POCT URINALYSIS DIP (CLINITEK)
Bilirubin, UA: NEGATIVE
Blood, UA: NEGATIVE
Glucose, UA: NEGATIVE mg/dL
Ketones, POC UA: NEGATIVE mg/dL
Leukocytes, UA: NEGATIVE
Nitrite, UA: NEGATIVE
POC PROTEIN,UA: NEGATIVE
Spec Grav, UA: 1.015 (ref 1.010–1.025)
Urobilinogen, UA: 0.2 U/dL
pH, UA: 6.5 (ref 5.0–8.0)

## 2023-09-24 NOTE — Progress Notes (Signed)
 GYNECOLOGY  VISIT  CC:   low back pain  HPI: 43 y.o. G33P2002 Married White or Caucasian female here for bilateral pelvic pain. Reports pain is a 3/10. Significantly worse with laying on back.  She has started having mid cycle spotting as well.  She is concerned this is related to an ovary or ovaries and would like imaging if possible today or very soon.  Pain is more in the groin especially on the right.  H/o pelvic floor dysfunction and has seen pelvic PT.  Felt this really helped and current pain is different.    POCT for urine was done today as pt gave sample and this was negative.  No other urine testing recommended.    Last pap was 03/2023.  Normal with neg HR HPV.     Past Medical History:  Diagnosis Date   Abnormal Pap smear of cervix    2014 ASCUS HPV HR+, 2015 LSIL HPV HR+   Beals syndrome    Depression    GERD (gastroesophageal reflux disease)    Heart murmur    IBS (irritable bowel syndrome)    Mitral valve prolapse    MRSA (methicillin resistant Staphylococcus aureus)    infection on LT side of face, and forehead (02/2011)   Nephrolithiasis    Recurrent UTI    Shingles     MEDS:   Current Outpatient Medications on File Prior to Visit  Medication Sig Dispense Refill   buPROPion (WELLBUTRIN XL) 150 MG 24 hr tablet Take 150 mg by mouth daily.     clobetasol  ointment (TEMOVATE ) 0.05 % Apply 1 application topically 2 (two) times daily. Apply as directed twice daily 60 g 0   famotidine (PEPCID) 40 MG tablet Take 40 mg by mouth in the morning and at bedtime.     fexofenadine (ALLEGRA) 180 MG tablet Take 180 mg by mouth daily.     fluticasone (FLONASE) 50 MCG/ACT nasal spray Place 1 spray into both nostrils as needed for allergies or rhinitis.     NONFORMULARY OR COMPOUNDED ITEM Amitriptyline 2.5%/ gabapentin 2.5%/ baclofen 2.5% in vaginal cream.  Place 1g daily in vaginal/ vulvar area.  Dispense 60g with 5 refills. 60 each 5   RABEprazole (ACIPHEX) 20 MG tablet Take 20 mg  by mouth daily.     valACYclovir  (VALTREX ) 1000 MG tablet TAKE TWO TABLETS BY MOUTH TWICE DAILY FOR 1 DAY AT ONSET OF OUTBREAK 30 tablet 1   methenamine  (HIPREX ) 1 g tablet Take 1 tablet (1 g total) by mouth 2 (two) times daily with a meal. (Patient not taking: Reported on 09/24/2023) 60 tablet 2   No current facility-administered medications on file prior to visit.    ALLERGIES: Other, Quinolones, and Sulfa antibiotics  SH:  married, non smoker  Review of Systems  Constitutional: Negative.   Genitourinary:        Mid cycle spotting Pelvic/groin pain    PHYSICAL EXAMINATION:    BP 131/86 (BP Location: Left Arm, Patient Position: Sitting, Cuff Size: Normal)   Pulse 74   Temp 98.4 F (36.9 C)   Wt 154 lb (69.9 kg)   LMP 09/06/2023 (Exact Date)   SpO2 100%   BMI 25.63 kg/m     General appearance: alert, cooperative and appears stated age  Abdomen: soft, non-tender; bowel sounds normal; no masses,  no organomegaly Lymph:  no inguinal LAD noted  Pelvic: External genitalia:  no lesions  Urethra:  normal appearing urethra with no masses, tenderness or lesions              Bartholins and Skenes: normal                 Vagina: normal mucosa without prolapse or lesions              Cervix: no lesions              Bimanual Exam:  Uterus:  normal size, contour, position, consistency, mobility, non-tender              Adnexa: no mass, fullness, tenderness              Pelvic floor non tender  Chaperone was present for exam.  Assessment/Plan: 1. Pelvic pain (Primary) - POCT URINALYSIS DIP (CLINITEK) - US  PELVIC COMPLETE WITH TRANSVAGINAL; Future  2. History of ovarian cyst - US  PELVIC COMPLETE WITH TRANSVAGINAL; Future

## 2023-09-28 ENCOUNTER — Encounter (HOSPITAL_BASED_OUTPATIENT_CLINIC_OR_DEPARTMENT_OTHER): Payer: Self-pay | Admitting: Obstetrics & Gynecology

## 2023-10-06 ENCOUNTER — Encounter (HOSPITAL_BASED_OUTPATIENT_CLINIC_OR_DEPARTMENT_OTHER): Payer: Self-pay | Admitting: Obstetrics & Gynecology

## 2023-10-08 ENCOUNTER — Encounter (INDEPENDENT_AMBULATORY_CARE_PROVIDER_SITE_OTHER): Payer: Self-pay | Admitting: Pediatric Genetics

## 2023-10-08 ENCOUNTER — Encounter (INDEPENDENT_AMBULATORY_CARE_PROVIDER_SITE_OTHER): Payer: Self-pay

## 2023-11-03 ENCOUNTER — Other Ambulatory Visit: Payer: Self-pay | Admitting: Obstetrics & Gynecology

## 2023-11-03 DIAGNOSIS — Z1231 Encounter for screening mammogram for malignant neoplasm of breast: Secondary | ICD-10-CM

## 2023-11-11 ENCOUNTER — Ambulatory Visit (HOSPITAL_BASED_OUTPATIENT_CLINIC_OR_DEPARTMENT_OTHER): Payer: PRIVATE HEALTH INSURANCE

## 2023-11-11 ENCOUNTER — Encounter (HOSPITAL_BASED_OUTPATIENT_CLINIC_OR_DEPARTMENT_OTHER): Payer: Self-pay | Admitting: Obstetrics & Gynecology

## 2023-11-11 ENCOUNTER — Ambulatory Visit (HOSPITAL_BASED_OUTPATIENT_CLINIC_OR_DEPARTMENT_OTHER): Payer: PRIVATE HEALTH INSURANCE | Admitting: Obstetrics & Gynecology

## 2023-11-11 ENCOUNTER — Other Ambulatory Visit (HOSPITAL_BASED_OUTPATIENT_CLINIC_OR_DEPARTMENT_OTHER): Payer: Self-pay | Admitting: Obstetrics & Gynecology

## 2023-11-11 VITALS — BP 129/91 | HR 69

## 2023-11-11 DIAGNOSIS — Q789 Osteochondrodysplasia, unspecified: Secondary | ICD-10-CM

## 2023-11-11 DIAGNOSIS — R102 Pelvic and perineal pain unspecified side: Secondary | ICD-10-CM

## 2023-11-11 DIAGNOSIS — Z8742 Personal history of other diseases of the female genital tract: Secondary | ICD-10-CM | POA: Diagnosis not present

## 2023-11-11 DIAGNOSIS — R1031 Right lower quadrant pain: Secondary | ICD-10-CM

## 2023-11-11 DIAGNOSIS — Z98891 History of uterine scar from previous surgery: Secondary | ICD-10-CM | POA: Diagnosis not present

## 2023-11-11 NOTE — Progress Notes (Signed)
   Ultrasound f/u Patient name: Emily [name] MRN [id number]  Date of birth: [birth year] Chief Complaint:   Follow-up  History of Present Illness:   Emily [name] is a 43 y.o. G30P2002 Caucasian female being seen today for discussion of ultrasound findings.  H/o pelvic pain and RLQ as well as ovarian cyst.  Uterus 9 x 4. 4x 6 cmc with endometrium of 5.28mm.  Both ovaries with follicles, largest 1.7cm but no complex cyst noted today.  No free fluid.  She has had benefit from pelvic PT in the past.  Insurance is changing and she is going to have to see providers with [facility] going forward.  Reviewed options for her and if she will need referrals.  Has had benefit from pelvic PT in the past but will need a different provider if decides to proceed.  Has considered surgery in the past.  Has thin lower uterine segment where c sections were performed.  Also h/o Beals Syndrome.  She knows I have concerns about increased risks for prolapse if has hysterectomy.  MIGS/pelvic pain specialists at [facility] discussed.  For now, no referral needed.     Patient's last menstrual period was 10/27/2023 (exact date).   Last pap 04/17/2023. Results were: NILM w/ HRHPV negative. Review of Systems:   Pertinent items are noted in HPI Pertinent History Reviewed:  Reviewed past medical,surgical, social and family history.  Reviewed problem list, medications and allergies. Physical Assessment:   Vitals:   11/11/23 1521  BP: (!) 129/91  Pulse: 69  SpO2: 100%  There is no height or weight on file to calculate BMI.        Physical Examination:   General appearance - well appearing, and in no distress  Mental status - alert, oriented to person, place, and time  Psych:  She has a normal mood and affect   Assessment & Plan:  1. RLQ abdominal pain (Primary) - considering pelvic pain specialist/MIGS provider at [facility].  Information given.    2. History of ovarian cyst - resolution noted on imaging  today  3. History of cesarean section  4. Beals syndrome   Time with pt:  24 minutes.  Documentation time:  5 mintues.  Ronal GORMAN Pinal, MD 11/23/2023 6:12 AM GYNECOLOGY  VISIT

## 2023-11-11 NOTE — Telephone Encounter (Addendum)
 Called the patient this morning. The patient's cough has not improved for two weeks which affects her sleep at night, and Lyrica has not been effective despite dose titration. Discussed the possible side effect due to combination of wellbutrin and lyrica, and the patient wishes to discontinue Lyrica. Sent a Cymbalta prescription (30mg  daily for 1week then increase to 30mg  BID), and a decision was made to taper off Lyrica.

## 2023-12-04 ENCOUNTER — Encounter (HOSPITAL_BASED_OUTPATIENT_CLINIC_OR_DEPARTMENT_OTHER): Payer: Self-pay | Admitting: Obstetrics & Gynecology

## 2024-01-15 ENCOUNTER — Ambulatory Visit: Payer: PRIVATE HEALTH INSURANCE

## 2024-02-08 ENCOUNTER — Encounter: Payer: Self-pay | Admitting: *Deleted

## 2024-04-18 ENCOUNTER — Ambulatory Visit (HOSPITAL_BASED_OUTPATIENT_CLINIC_OR_DEPARTMENT_OTHER): Payer: PRIVATE HEALTH INSURANCE | Admitting: Obstetrics & Gynecology
